# Patient Record
Sex: Male | Born: 1945 | Race: White | Hispanic: No | Marital: Married | Smoking: Former smoker
Health system: Southern US, Community
[De-identification: ages and names within clinical notes are randomized; demographics above are authoritative.]

## PROBLEM LIST (undated history)

## (undated) DIAGNOSIS — I2510 Atherosclerotic heart disease of native coronary artery without angina pectoris: Secondary | ICD-10-CM

## (undated) DIAGNOSIS — I10 Essential (primary) hypertension: Secondary | ICD-10-CM

## (undated) DIAGNOSIS — G43109 Migraine with aura, not intractable, without status migrainosus: Secondary | ICD-10-CM

## (undated) DIAGNOSIS — M1990 Unspecified osteoarthritis, unspecified site: Secondary | ICD-10-CM

## (undated) DIAGNOSIS — E785 Hyperlipidemia, unspecified: Secondary | ICD-10-CM

## (undated) DIAGNOSIS — I480 Paroxysmal atrial fibrillation: Secondary | ICD-10-CM

## (undated) DIAGNOSIS — T8859XA Other complications of anesthesia, initial encounter: Secondary | ICD-10-CM

## (undated) DIAGNOSIS — C61 Malignant neoplasm of prostate: Secondary | ICD-10-CM

## (undated) DIAGNOSIS — I499 Cardiac arrhythmia, unspecified: Secondary | ICD-10-CM

## (undated) DIAGNOSIS — Z87442 Personal history of urinary calculi: Secondary | ICD-10-CM

## (undated) DIAGNOSIS — R972 Elevated prostate specific antigen [PSA]: Secondary | ICD-10-CM

## (undated) DIAGNOSIS — C801 Malignant (primary) neoplasm, unspecified: Secondary | ICD-10-CM

## (undated) DIAGNOSIS — T4145XA Adverse effect of unspecified anesthetic, initial encounter: Secondary | ICD-10-CM

## (undated) DIAGNOSIS — K219 Gastro-esophageal reflux disease without esophagitis: Secondary | ICD-10-CM

## (undated) DIAGNOSIS — Z0389 Encounter for observation for other suspected diseases and conditions ruled out: Secondary | ICD-10-CM

## (undated) DIAGNOSIS — T7840XA Allergy, unspecified, initial encounter: Secondary | ICD-10-CM

## (undated) HISTORY — PX: COLONOSCOPY: SHX174

## (undated) HISTORY — DX: Allergy, unspecified, initial encounter: T78.40XA

## (undated) HISTORY — DX: Unspecified osteoarthritis, unspecified site: M19.90

## (undated) HISTORY — PX: KNEE ARTHROSCOPY: SUR90

## (undated) HISTORY — DX: Encounter for observation for other suspected diseases and conditions ruled out: Z03.89

## (undated) HISTORY — DX: Paroxysmal atrial fibrillation: I48.0

## (undated) HISTORY — PX: UPPER GASTROINTESTINAL ENDOSCOPY: SHX188

## (undated) HISTORY — PX: PROSTATE BIOPSY: SHX241

## (undated) HISTORY — DX: Hyperlipidemia, unspecified: E78.5

## (undated) HISTORY — DX: Essential (primary) hypertension: I10

---
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
## 1997-12-13 ENCOUNTER — Ambulatory Visit (HOSPITAL_COMMUNITY): Admission: RE | Admit: 1997-12-13 | Discharge: 1997-12-13 | Payer: Self-pay | Admitting: Orthopedic Surgery

## 1998-10-25 ENCOUNTER — Ambulatory Visit (HOSPITAL_COMMUNITY): Admission: RE | Admit: 1998-10-25 | Discharge: 1998-10-25 | Payer: Self-pay | Admitting: Internal Medicine

## 2001-06-21 ENCOUNTER — Encounter: Payer: Self-pay | Admitting: Gastroenterology

## 2004-08-23 ENCOUNTER — Ambulatory Visit (HOSPITAL_COMMUNITY): Admission: RE | Admit: 2004-08-23 | Discharge: 2004-08-23 | Payer: Self-pay | Admitting: Internal Medicine

## 2006-04-07 DIAGNOSIS — IMO0001 Reserved for inherently not codable concepts without codable children: Secondary | ICD-10-CM

## 2006-04-07 HISTORY — DX: Reserved for inherently not codable concepts without codable children: IMO0001

## 2006-07-07 DIAGNOSIS — Z87442 Personal history of urinary calculi: Secondary | ICD-10-CM

## 2006-07-07 HISTORY — DX: Personal history of urinary calculi: Z87.442

## 2006-07-16 ENCOUNTER — Emergency Department (HOSPITAL_COMMUNITY): Admission: EM | Admit: 2006-07-16 | Discharge: 2006-07-16 | Payer: Self-pay | Admitting: Emergency Medicine

## 2006-07-17 ENCOUNTER — Ambulatory Visit (HOSPITAL_BASED_OUTPATIENT_CLINIC_OR_DEPARTMENT_OTHER): Admission: RE | Admit: 2006-07-17 | Discharge: 2006-07-17 | Payer: Self-pay | Admitting: Urology

## 2006-07-19 ENCOUNTER — Inpatient Hospital Stay (HOSPITAL_COMMUNITY): Admission: EM | Admit: 2006-07-19 | Discharge: 2006-07-20 | Payer: Self-pay | Admitting: Emergency Medicine

## 2006-07-19 ENCOUNTER — Encounter (INDEPENDENT_AMBULATORY_CARE_PROVIDER_SITE_OTHER): Payer: Self-pay | Admitting: Cardiology

## 2006-07-20 HISTORY — PX: CARDIAC CATHETERIZATION: SHX172

## 2006-07-23 ENCOUNTER — Ambulatory Visit (HOSPITAL_COMMUNITY): Admission: RE | Admit: 2006-07-23 | Discharge: 2006-07-23 | Payer: Self-pay | Admitting: Urology

## 2010-03-22 ENCOUNTER — Encounter: Payer: Self-pay | Admitting: Gastroenterology

## 2010-03-22 ENCOUNTER — Encounter (INDEPENDENT_AMBULATORY_CARE_PROVIDER_SITE_OTHER): Payer: Self-pay | Admitting: *Deleted

## 2010-03-22 ENCOUNTER — Ambulatory Visit (HOSPITAL_COMMUNITY)
Admission: RE | Admit: 2010-03-22 | Discharge: 2010-03-22 | Payer: Self-pay | Source: Home / Self Care | Attending: Gastroenterology | Admitting: Gastroenterology

## 2010-04-11 ENCOUNTER — Ambulatory Visit
Admission: RE | Admit: 2010-04-11 | Discharge: 2010-04-11 | Payer: Self-pay | Source: Home / Self Care | Attending: Gastroenterology | Admitting: Gastroenterology

## 2010-04-11 ENCOUNTER — Encounter: Payer: Self-pay | Admitting: Gastroenterology

## 2010-04-11 DIAGNOSIS — I10 Essential (primary) hypertension: Secondary | ICD-10-CM | POA: Insufficient documentation

## 2010-04-11 DIAGNOSIS — E785 Hyperlipidemia, unspecified: Secondary | ICD-10-CM | POA: Insufficient documentation

## 2010-04-11 DIAGNOSIS — I480 Paroxysmal atrial fibrillation: Secondary | ICD-10-CM | POA: Insufficient documentation

## 2010-04-11 DIAGNOSIS — Z87442 Personal history of urinary calculi: Secondary | ICD-10-CM | POA: Insufficient documentation

## 2010-04-11 DIAGNOSIS — Z872 Personal history of diseases of the skin and subcutaneous tissue: Secondary | ICD-10-CM | POA: Insufficient documentation

## 2010-04-11 DIAGNOSIS — K649 Unspecified hemorrhoids: Secondary | ICD-10-CM | POA: Insufficient documentation

## 2010-04-11 DIAGNOSIS — Z860100 Personal history of colon polyps, unspecified: Secondary | ICD-10-CM | POA: Insufficient documentation

## 2010-04-11 DIAGNOSIS — K5730 Diverticulosis of large intestine without perforation or abscess without bleeding: Secondary | ICD-10-CM | POA: Insufficient documentation

## 2010-04-11 DIAGNOSIS — K222 Esophageal obstruction: Secondary | ICD-10-CM | POA: Insufficient documentation

## 2010-04-30 ENCOUNTER — Encounter: Payer: Self-pay | Admitting: Gastroenterology

## 2010-04-30 ENCOUNTER — Ambulatory Visit (HOSPITAL_COMMUNITY)
Admission: RE | Admit: 2010-04-30 | Discharge: 2010-04-30 | Payer: Self-pay | Source: Home / Self Care | Attending: Gastroenterology | Admitting: Gastroenterology

## 2010-05-06 ENCOUNTER — Encounter: Payer: Self-pay | Admitting: Gastroenterology

## 2010-05-09 NOTE — Assessment & Plan Note (Signed)
Summary: f/u food empaction EGD/pl   History of Present Illness Visit Type: new patient  Primary GI MD: Melvia Heaps MD [facility] Primary Provider: Nila Nephew, MD  Requesting Provider: na Chief Complaint: F/u from EGD. Pt states that he is ok and denies any GI complaints  History of Present Illness:   Luis [name] is a pleasant 65 year old white male referred at the request of Dr. Chilton Si for followup of his esophageal stricture.  Approximately 3 weeks ago he underwent emergency endoscopy for a food impaction.  A distal esophageal stricture was seen and dilated to 15 mm.  He has had occasional dysphagia to solids.  He denies pyrosis.   GI Review of Systems      Denies abdominal pain, acid reflux, belching, bloating, chest pain, dysphagia with liquids, dysphagia with solids, heartburn, loss of appetite, nausea, vomiting, vomiting blood, weight loss, and  weight gain.        Denies anal fissure, black tarry stools, change in bowel habit, constipation, diarrhea, diverticulosis, fecal incontinence, heme positive stool, hemorrhoids, irritable bowel syndrome, jaundice, light color stool, liver problems, rectal bleeding, and  rectal pain.    Current Medications (verified): 1)  Crestor 10 Mg Tabs (Rosuvastatin Calcium) .... One Tablet By Mouth Once Daily 2)  Fish Oil 1000 Mg Caps (Omega-3 Fatty Acids) .... 2 Capsule By Mouth Once Daily 3)  Aspirin 325 Mg Tabs (Aspirin) .... One Tablet By Mouth Once Daily 4)  Metoprolol Tartrate 100 Mg Tabs (Metoprolol Tartrate) .... One Tablet By Mouth Once Daily 5)  Diltiazem Hcl Cr 180 Mg Xr24h-Cap (Diltiazem Hcl) .... One Capsule By Mouth Once Daily  Allergies (verified): No Known Drug Allergies  Past History:  Past Medical History: HYPERTENSION (ICD-401.9) HYPERLIPIDEMIA (ICD-272.4) HEMORRHOIDS (ICD-455.6) ANAL FISSURE, HX OF (ICD-V13.3) ATRIAL FIBRILLATION (ICD-427.31) RENAL CALCULUS, HX OF (ICD-V13.01) DIVERTICULAR DISEASE (ICD-562.10) COLONIC POLYPS,  HYPERPLASTIC, HX OF (ICD-V12.72)  Past Surgical History: Unremarkable  Family History: No FH of Colon Cancer: Family History of Pancreatic Cancer: ? 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  Alcohol Use - yes: 2-3  Daily Caffeine Use: 1 daily  Illicit Drug Use - no Smoking Status:  quit Drug Use:  no  Review of Systems  The patient denies allergy/sinus, anemia, anxiety-new, arthritis/joint pain, back pain, blood in urine, breast changes/lumps, change in vision, confusion, cough, coughing up blood, depression-new, fainting, fatigue, fever, headaches-new, hearing problems, heart murmur, heart rhythm changes, itching, menstrual pain, muscle pains/cramps, night sweats, nosebleeds, pregnancy symptoms, shortness of breath, skin rash, sleeping problems, sore throat, swelling of feet/legs, swollen lymph glands, thirst - excessive , urination - excessive , urination changes/pain, urine leakage, vision changes, and voice change.    Vital Signs:  Patient profile:   65 year old male Height:      65 inches Weight:      174 pounds BMI:     29.06 BSA:     1.87 Pulse rate:   88 / minute Pulse rhythm:   regular BP sitting:   120 / 64  (left arm) Cuff size:   regular  Vitals Entered By: Ok Anis CMA (April 11, 2010 1:28 PM)  Physical Exam  Additional Exam:  On physical exam he is a well-developed well-nourished male  skin: anicteric HEENT: normocephalic; PEERLA; no nasal or pharyngeal abnormalities neck: supple nodes: no cervical lymphadenopathy chest: clear to ausculatation and percussion heart: no murmurs, gallops, or rubs abd: soft, nontender; BS  normoactive; no abdominal masses, tenderness, organomegaly rectal: deferred ext: no cynanosis, clubbing, edema skeletal: no deformities neuro: oriented x 3; no focal abnormalities    Impression & Recommendations:  Problem # 1:  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  Patient's questions were answered.  Orders: ZEGD Balloon Dil (ZEGD Balloon)  Problem # 2:  HYPERTENSION (ICD-401.9) Assessment: Comment Only  Problem # 3:  ATRIAL FIBRILLATION (ICD-427.31) Assessment: Comment Only  Patient Instructions: 1)  Copy sent to : Nila Nephew, MD  2)  Your EGD is scheduled on 04/30/2010 at 12:30pm 3)  Upper Endoscopy brochure given.  4)  Upper Endoscopy with Dilatation brochure given.  5)  The medication list was reviewed and reconciled.  All changed / newly prescribed medications were explained.  A complete medication list was provided to the patient / caregiver. Prescriptions: OMEPRAZOLE 20 MG CPDR (OMEPRAZOLE) 1 by mouth once daily  #30 x 3   Entered by:   Merri Ray CMA (AAMA)   Authorized by:   Louis Meckel MD   Signed by:   Merri Ray CMA (AAMA) on 04/11/2010   Method used:   Electronically to        [facility] [address] (retail)       [address]       [place], [place]  [address]       Ph: [phone]       Fax: [phone]   RxID:   [id number]

## 2010-05-09 NOTE — Letter (Signed)
Summary: EGD Instructions  [facility]  [address] [place], [place] [address]   Phone: [phone]  Fax: [phone]       Luis [name]    August 04, 1945    MRN: [id number]       Procedure Day /Date:Tuesday--04-30-2010     Arrival Time: 11:30 am     Procedure Time:12:30 pm     Location of Procedure:                     [facility] ( Outpatient Registration)   PREPARATION FOR ENDOSCOPY   On1-24-2012 THE DAY OF THE PROCEDURE:  1.   No solid foods, milk or milk products are allowed after midnight the night before your procedure.  2.   Do not drink anything colored red or purple.  Avoid juices with pulp.  No orange juice.  3.  You may drink clear liquids until 9:30am, which is 4 hours before your procedure.                                                                                                CLEAR LIQUIDS INCLUDE: Water Jello Ice Popsicles Tea (sugar ok, no milk/cream) Powdered fruit flavored drinks Coffee (sugar ok, no milk/cream) Gatorade Juice: apple, white grape, white cranberry  Lemonade Clear bullion, consomm, broth Carbonated beverages (any kind) Strained chicken noodle soup Hard Candy   MEDICATION INSTRUCTIONS  Unless otherwise instructed, you should take regular prescription medications with a small sip of water as early as possible the morning of your procedure.               OTHER INSTRUCTIONS  You will need a responsible adult at least 67 years of age to accompany you and drive you home.   This person must remain in the waiting room during your procedure.  Wear loose fitting clothing that is easily removed.  Leave jewelry and other valuables at home.  However, you may wish to bring a book to read or an iPod/MP3 player to listen to music as you wait for your procedure to start.  Remove all body piercing jewelry and leave at home.  Total time from sign-in until discharge is approximately 2-3 hours.  You  should go home directly after your procedure and rest.  You can resume normal activities the day after your procedure.  The day of your procedure you should not:   Drive   Make legal decisions   Operate machinery   Drink alcohol   Return to work  You will receive specific instructions about eating, activities and medications before you leave.    The above instructions have been reviewed and explained to me by   _______________________    I fully understand and can verbalize these instructions _____________________________ Date _________

## 2010-05-09 NOTE — Procedures (Signed)
Summary: Endo Prep  Endo Prep   Imported By: Lester Dale 05/01/2010 08:50:11  _____________________________________________________________________  External Attachment:    Type:   Image     Comment:   External Document

## 2010-05-09 NOTE — Procedures (Signed)
Summary: Upper Endoscopy  Patient: Luis [name] Note: All result statuses are Final unless otherwise noted.  Tests: (1) Upper Endoscopy (EGD)   EGD Upper Endoscopy       DONE     [facility]     [address] [place], [place]  [address]           ENDOSCOPY PROCEDURE REPORT           PATIENT:  Luis [name], Luis [name]  MR#:  [id number]     BIRTHDATE:  [birth year], 64 yrs. old  GENDER:  male           ENDOSCOPIST:  Barbette Hair. [name], MD     Referred by:  Nila Nephew, M.D.           PROCEDURE DATE:  04/30/2010     PROCEDURE:  EGD with balloon dilatation     ASA CLASS:  Class II     INDICATIONS:  dilation of esophageal stricture           MEDICATIONS:   Fentanyl 50 mcg IV, Versed 5 mg IV     TOPICAL ANESTHETIC:  Cetacaine Spray           DESCRIPTION OF PROCEDURE:   After the risks benefits and     alternatives of the procedure were thoroughly explained, informed     consent was obtained.  The Pentax Gastroscope [id number] endoscope     was introduced through the mouth and advanced to the third portion     of the duodenum, without limitations.  The instrument was slowly     withdrawn as the mucosa was fully examined.     <<PROCEDUREIMAGES>>           A stricture was found at the gastroesophageal junction. Moderate     stricture (see image2). balloon dilation 16-17-18mm TTS balloon     dilators inflated for 30 seconds each; moderate resistance; small     amount of blood  Otherwise the examination was normal (see image3,     image4, image6, and image7).    Retroflexed views revealed no     abnormalities.    The scope was then withdrawn from the patient     and the procedure completed.           COMPLICATIONS:  None           ENDOSCOPIC IMPRESSION:     1) Stricture at the gastroesophageal junction - s/p balloon     dilitation     2) Otherwise normal examination     RECOMMENDATIONS:     1) dilatations PRN     2) Call office next 2-3 days to schedule an office appointment     for 1 month           REPEAT EXAM:  No           ______________________________     Barbette Hair. [name], MD           CC:           n.     eSIGNED:   Barbette Hair. Cheridan Kibler at 04/30/2010 12:53 PM           Deloria Lair, [id number]  Note: An exclamation mark (!) indicates a result that was not dispersed into the flowsheet. Document Creation Date: 04/30/2010 12:54 PM _______________________________________________________________________  (1) Order result status: Final Collection or observation date-time: 04/30/2010 12:49 Requested date-time:  Receipt date-time:  Reported date-time:  Referring Physician:   Ordering Physician: Melvia Heaps [phone]) Specimen Source:  Source: Launa Grill Order Number: [id number] Lab site:

## 2010-05-09 NOTE — Letter (Signed)
Summary: Appt Reminder 2  [facility]  [address] [place], [place] [address]   Phone: [phone]  Fax: [phone]        March 22, 2010 MRN: [id number]    Jayant Kriz [address]  [place], [place]  [address]    Dear Mr. Claudette Laws,   You have a return appointment with Dr. [name] on 04/11/10 at 1:30 pm.  Please remember to bring a complete list of the medicines you are taking, your insurance card and your co-pay.  If you have to cancel or reschedule this appointment, please call before 5:00 pm the evening before to avoid a cancellation fee.  If you have any questions or concerns, please call [phone].    Sincerely,    Chales Abrahams CMA (AAMA)  Appended Document: Appt Reminder 2 letter mailed  Appended Document: Appt Reminder 2 new pt letter mailed

## 2010-05-09 NOTE — Procedures (Signed)
Summary: colonoscopy   Colonoscopy  Procedure date:  06/21/2001  Findings:      Results: Polyp.  Results: Diverticulosis.       Location:  [facility].    Comments:      Repeat colonoscopy in 3 years.    Patient Name: Luis [name], Luis [name] MRN:  Procedure Procedures: Colonoscopy CPT: [address].    with Hot Biopsy(s)CPT: Z451292.    with polypectomy. CPT: A3573898.  Personnel: Endoscopist: Barbette Hair. [name], MD.  Referred By: Erskine Speed, MD.  Indications Symptoms: Hematochezia.  Average Risk Screening Routine.  History  Pre-Exam Physical: Performed Jun 21, 2001. Entire physical exam was normal.  Exam Exam: Extent of exam reached: Cecum, extent intended: Cecum.  The cecum was identified by IC valve. Colon retroflexion performed. ASA Classification: II. Tolerance: good.  Monitoring: Pulse and BP monitoring, Oximetry used. Supplemental O2 given. at 2 Liters.  Colon Prep Used Golytely for colon prep. Prep results: good.  Sedation Meds: Fentanyl 100 mcg. given IV. Versed 10 mg. given IV.  Findings - DIVERTICULOSIS: Transverse Colon. ICD9: Diverticulosis: 562.10.  DIVERTICULOSIS: Transverse Colon. ICD9: Diverticulosis: 562.10.  - DIVERTICULOSIS: Descending Colon to Sigmoid Colon. ICD9: Diverticulosis: 562.10.  POLYP: Sigmoid Colon, Maximum size: 2 mm. Distance from Anus 20 cm. Procedure:  hot biopsy, Polyp sent to pathology. ICD9: Colon Polyps: 211.3.  POLYP: Sigmoid Colon, Maximum size: 3 mm. Distance from Anus 20 cm. Procedure:  hot biopsy, sent to pathology. ICD9: Colon Polyps: 211.3.  POLYP: Sigmoid Colon, Maximum size: 4 mm. Distance from Anus 18 cm. Procedure:  snare with cautery, ICD9: Colon Polyps: 211.3.   Assessment Abnormal examination, see findings above.  Diagnoses: 562.10: Diverticulosis.  211.3: Colon Polyps.   Events  Unplanned Interventions: No intervention was required.  Unplanned Events: There were no complications. Plans Patient  Education: Patient given standard instructions for: Polyps. Diverticulosis.  Scheduling/Referral: Colonoscopy, to Barbette Hair. [name], MD, around Jun 21, 2004.    This report was created from the original endoscopy report, which was reviewed and signed by the above listed endoscopist.

## 2010-05-09 NOTE — Letter (Signed)
Summary: New Patient letter  [facility]  [address] [place], [place] [address]   Phone: [phone]  Fax: [phone]       03/22/2010 MRN: [id number]  Luis [name] [address] [place], [place]  [address]  [place]  Dear Luis [name],  Welcome to the Gastroenterology Division at [facility].    You are scheduled to see Dr.  [name]  on 04/11/10  at 1:30 pm on the 3rd floor at [facility], [address]  We ask that you try to arrive at our office 15 minutes prior to your appointment time to allow for check-in.  We would like you to complete the enclosed self-administered evaluation form prior to your visit and bring it with you on the day of your appointment.  We will review it with you.  Also, please bring a complete list of all your medications or, if you prefer, bring the medication bottles and we will list them.  Please bring your insurance card so that we may make a copy of it.  If your insurance requires a referral to see a specialist, please bring your referral form from your primary care physician.  Co-payments are due at the time of your visit and may be paid by cash, check or credit card.     Your office visit will consist of a consult with your physician (includes a physical exam), any laboratory testing he/she may order, scheduling of any necessary diagnostic testing (e.g. x-ray, ultrasound, CT-scan), and scheduling of a procedure (e.g. Endoscopy, Colonoscopy) if required.  Please allow enough time on your schedule to allow for any/all of these possibilities.    If you cannot keep your appointment, please call [phone] to cancel or reschedule prior to your appointment date.  This allows [place] the opportunity to schedule an appointment for another patient in need of care.  If you do not cancel or reschedule by 5 p.m. the business day prior to your appointment date, you will be charged a $50.00 late cancellation/no-show fee.    Thank you for  choosing [facility] for your medical needs.  We appreciate the opportunity to care for you.  Please visit [place] at our website  to learn more about our practice.                     Sincerely,                                                             The Gastroenterology Division

## 2010-05-09 NOTE — Procedures (Signed)
Summary: Upper Endoscopy  Patient: Luis [name] Note: All result statuses are Final unless otherwise noted.  Tests: (1) Upper Endoscopy (EGD)   EGD Upper Endoscopy       DONE     [facility]     [address] [place], [place]  [address]           ENDOSCOPY PROCEDURE REPORT           PATIENT:  Luis [name], Luis [name]  MR#:  [id number]     BIRTHDATE:  Aug 29, 1945, 64 yrs. old  GENDER:  male     ENDOSCOPIST:  Rachael Fee, MD     Referred by:  Nila Nephew, M.D.     PROCEDURE DATE:  03/22/2010     PROCEDURE:  EGD with foreign body removal, EGD with balloon     dilatation     ASA CLASS:  Class II     INDICATIONS:  food impaction, pork     MEDICATIONS:  Fentanyl 80 mcg IV, Versed 8 mg IV     TOPICAL ANESTHETIC:  none     DESCRIPTION OF PROCEDURE:   After the risks benefits and     alternatives of the procedure were thoroughly explained, informed     consent was obtained.  The  endoscope was introduced through the     mouth and advanced to the second portion of the duodenum, without     limitations.  The instrument was slowly withdrawn as the mucosa     was fully examined.     <<PROCEDUREIMAGES>>     There was fluid and a solid meat food impaction in distal     esophagus. The fluid was suctioned out and then the food bolus was     gently pushed into the stomach. This revealed a peptic appearing,     focal stricture at GE junction (lumen through the stricture was     11mm). There was associated reflux and foreign body reaction     esophagitis. The stricture was dilated up to 15mm with CRE TTS     balloon (see image1, image11, image3, and image9).  Otherwise the     examination was normal (see image7, image6, and image5).     Retroflexed views revealed no abnormalities.    The scope was then     withdrawn from the patient and the procedure completed.           COMPLICATIONS:  None           ENDOSCOPIC IMPRESSION:     1) Food bolus in distal esophagus, pushed into  stomach.     Underlying, benign/peptic appearing focal stricture was dilated up     to 15mm.     2) Otherwise normal examination           RECOMMENDATIONS:     He will start twice daily OTC prilosec and continue until seen     by Dr. [name] in office (currently he does not take PPI).     My office will arrange return visit with Dr. [name] in 3-4 weeks     to assess his swallowing and consider further dilation.     He knows to eat slowly and chew his food very well for now.           ______________________________     Rachael Fee, MD           cc: Barnet Pall, MD  n.     eSIGNED:   Rachael Fee at 03/22/2010 02:30 PM           Deloria Lair, [id number]  Note: An exclamation mark (!) indicates a result that was not dispersed into the flowsheet. Document Creation Date: 03/22/2010 2:36 PM _______________________________________________________________________  (1) Order result status: Final Collection or observation date-time: 03/22/2010 14:20 Requested date-time:  Receipt date-time:  Reported date-time:  Referring Physician:   Ordering Physician: Rob Bunting [phone]) Specimen Source:  Source: Launa Grill Order Number: [id number] Lab site:

## 2010-05-15 NOTE — Medication Information (Signed)
Summary: Omeprazole/[facility]  Omeprazole/[facility]   Imported By: Sherian Rein 05/09/2010 09:36:14  _____________________________________________________________________  External Attachment:    Type:   Image     Comment:   External Document

## 2010-06-07 ENCOUNTER — Encounter: Payer: Self-pay | Admitting: Gastroenterology

## 2010-06-07 ENCOUNTER — Ambulatory Visit (INDEPENDENT_AMBULATORY_CARE_PROVIDER_SITE_OTHER): Payer: Medicare Other | Admitting: Gastroenterology

## 2010-06-07 DIAGNOSIS — K222 Esophageal obstruction: Secondary | ICD-10-CM

## 2010-06-18 NOTE — Assessment & Plan Note (Signed)
Summary: 45m Procedure f-up appt/sched w-pt/cx fee/YF   History of Present Illness Visit Type: Follow-up Visit Primary GI MD: Melvia Heaps MD [facility] Primary Provider: Nila Nephew, MD  Requesting Provider: na Chief Complaint: F/u from EGD. Pt states he is fine and denies any GI complaints today  History of Present Illness:    Luis [name] has returned for followup of his esophageal stricture. Following a food impaction and initial dilatation he underwent subsequent esophageal dilatation. Since that time he has felt well and  no longer has dysphagia. He was dilated to 18 mm.  He has very rare pyrosis.   GI Review of Systems      Denies abdominal pain, acid reflux, belching, bloating, chest pain, dysphagia with liquids, dysphagia with solids, heartburn, loss of appetite, nausea, vomiting, vomiting blood, weight loss, and  weight gain.        Denies anal fissure, black tarry stools, change in bowel habit, constipation, diarrhea, diverticulosis, fecal incontinence, heme positive stool, hemorrhoids, irritable bowel syndrome, jaundice, light color stool, liver problems, rectal bleeding, and  rectal pain.    Current Medications (verified): 1)  Crestor 10 Mg Tabs (Rosuvastatin Calcium) .... One Tablet By Mouth Once Daily 2)  Fish Oil 1000 Mg Caps (Omega-3 Fatty Acids) .... 2 Capsule By Mouth Once Daily 3)  Aspirin 325 Mg Tabs (Aspirin) .... One Tablet By Mouth Once Daily 4)  Metoprolol Tartrate 100 Mg Tabs (Metoprolol Tartrate) .... One Tablet By Mouth Once Daily 5)  Diltiazem Hcl Cr 180 Mg Xr24h-Cap (Diltiazem Hcl) .... One Capsule By Mouth Once Daily 6)  Omeprazole 20 Mg Cpdr (Omeprazole) .[name].. 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 Unremarkable  Family History: Reviewed history from 04/11/2010 and no changes required. No FH of Colon Cancer: Family History of Pancreatic Cancer: ? Mother  Family History of Heart Disease: Father   Social History: Reviewed history from 04/11/2010 and no changes required. 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  Alcohol Use - yes: 2-3  Daily Caffeine Use: 1 daily  Illicit Drug Use - no  Review of Systems  The patient denies allergy/sinus, anemia, anxiety-new, arthritis/joint pain, back pain, blood in urine, breast changes/lumps, change in vision, confusion, cough, coughing up blood, depression-new, fainting, fatigue, fever, headaches-new, hearing problems, heart murmur, heart rhythm changes, itching, menstrual pain, muscle pains/cramps, night sweats, nosebleeds, pregnancy symptoms, shortness of breath, skin rash, sleeping problems, sore throat, swelling of feet/legs, swollen lymph glands, thirst - excessive , urination - excessive , urination changes/pain, urine leakage, vision changes, and voice change.    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  All  changed / newly prescribed medications were explained.  A complete medication list was provided to the patient / caregiver.

## 2010-08-23 NOTE — Cardiovascular Report (Signed)
Luis [name], Luis [name]                  ACCOUNT NO.:  [id number]   MEDICAL RECORD NO.:  [id number]          PATIENT TYPE:  INP   LOCATION:  2918                         FACILITY:  [facility]   PHYSICIAN:  Cristy Hilts. [name], MD       DATE OF BIRTH:  [birth year]   DATE OF PROCEDURE:  07/20/2006  DATE OF DISCHARGE:                            CARDIAC CATHETERIZATION   PROCEDURE PERFORMED:  Left heart catheterization including  1. Left ventriculography  2. Selective left and right coronary angiography.  3. Abdominal aortogram.   INDICATIONS:  The patient is a 65 year old gentleman with a very strong  family history of premature artery disease who was admitted to the  hospital with chest discomfort and a new onset of atrial fibrillation  with rapid ventricular response.  He also had a short, brief episode of  wide complex tachycardia.  He was ruled out for myocardial infarction  and is now brought to the cardiac catheterization lab to evaluate his  coronary anatomy.  Abdominal aortogram was performed to evaluate for  abdominal atherosclerosis given significant calcification of his  coronary arteries.   HEMODYNAMIC DATA:  The ventricular pressures were 100/1 with an end  diastolic pressure of 5 mm/Hg.  The aortic pressure 110/60 with a mean  of 80 mm/Hg.  There was no pressure gradient across the aortic valve.   ANGIOGRAPHIC DATA:  LEFT VENTRICLE:  Left ventricular systolic function  was normal with ejection fraction of 60%.  There was no significant  mitral regurgitation.  There was no wall motion abnormality.   RIGHT CORONARY ARTERY:  The right coronary artery is small and  nondominant.  There was mild calcification noted in the ostium of the  right coronary artery.   LEFT MAIN:  The left main is a large caliber vessel.  There is mild  calcification noted.   LAD:  The LAD is a large vessel.  It has got a mild to moderate amount  of calcification in the proximal and mid segments.  Gives origin to  a  moderate-sized diagonal-1 and a moderate-sized Biochemist, clinical.  The diagonal-  2 has an ostial 40% to at most 50% smooth stenosis.   CIRCUMFLEX:  The circumflex is a dominant vessel.  Gives origin to large  OM-1 and OM-2.  It continues as the PDA.  This PDA in the distal segment  has a 40% stenosis.   ABDOMINAL ANGIOGRAM:  Abdominal angiogram revealed presence of two renal  arteries, one on either side.  They are widely patent.  There is no  evidence of abdominal aneurysm or significant calcification of the  abdominal aorta.  Incidentally, a left aortic stent was visualized.   IMPRESSION:  No significant coronary artery disease.  There is mild to  moderate mixed plaque morphology including mild calcification of the  proximal and mid LAD with a soft plaque in the distal circumflex and  diagonal-2 ostium.   RECOMMENDATIONS:  Aggressive risk modification is indicated.  The  patient can be discharged home on calcium channel blocker and beta  blocker therapy.  If he has recurrence of  atrial fibrillation.  He  should be considered for atrial fibrillation ablation.   A total of 90 ml of contrast was utilized for diagnostic angiography.   PROCEDURE:  Usual sterile precautions using a 6-French right femoral  arterial access, a 6-French multipurpose B2 catheter was advanced into  the ascending aorta over the J-wire and the catheter was advanced into  left ventricle.  The left ventriculography was performed both in LAO and  RAO projections.  Catheter pulled back into the ascending aorta and  pressure gradient across the aortic valve was checked.  Right coronary  artery selective angiography was performed.  Catheter then pulled back  to the abdominal aorta.  Abdominal aortogram was performed.  A 6.1  Judkins IV diagnostic catheter was utilized in the left main coronary  artery and angiography was performed.  Catheter was then pulled out of  the body in the usual fashion.  Right femoral  angiography was performed  through the arterial access sheath and access closed with Star Close  with excellent hemostasis.  No immediate complication noted.   RECOMMENDATIONS:  given mixed plaque morphology with calcification in  the proximal and mid LAD, he will need very aggressive lipid lowering  therapy with a goal of raising his HDL to greater than 45, HDL to less  than or equal to 70, triglycerides less than or equal to 150.      Cristy Hilts. [name], MD  Electronically Signed     JRG/MEDQ  D:  07/20/2006  T:  07/20/2006  Job:  [id number]   cc:   Erskine Speed, M.D.  Dr Everlene Balls

## 2010-08-23 NOTE — Op Note (Signed)
NAMEBRENTLEY, [name]                  ACCOUNT NO.:  [id number]   MEDICAL RECORD NO.:  [id number]          PATIENT TYPE:  AMB   LOCATION:  [facility]                         FACILITY:  [facility]   PHYSICIAN:  Boston Service, M.D.DATE OF BIRTH:  09-05-1945   DATE OF PROCEDURE:  07/17/2006  DATE OF DISCHARGE:                               OPERATIVE REPORT   PREOPERATIVE DIAGNOSIS:  A 9 mm left ureteropelvic junction stone.   POSTOPERATIVE DIAGNOSIS:  A 9 mm left ureteropelvic junction stone.   OPERATION/PROCEDURE:  Cystoscopy, retrograde, double-J stent, 6-French  26 cm.   SURGEON:  Boston Service, M.D.   ASSISTANT:  None.   ANESTHESIA:  General.   SPECIMENS:  None.   ESTIMATED BLOOD LOSS:  Minimal.   COMPLICATIONS:  None obvious.   DESCRIPTION OF PROCEDURE:  The patient was prepped and draped in the  dorsal lithotomy position after institution of an adequate level of  general anesthesia.  A  well lubricated 21-French panendoscope was  gently inserted at the urethral meatus.  Normal urethra and sphincter,  nonobstructive prostate.  Bladder was carefully inspected with 12 and 70  degrees lenses.  No obvious intravesical pathology.   Retrograde catheter was selected, positioned at the right ureteral  orifice, then at the left ureteral orifice, in each case with gentle  injection of contrast.  Ureter, pelvis and calyces were gently outlined.  There was no filling defect or obstruction on the right with prompt  drainage at 3-5 minutes.  On the left side, however, there was a tightly  impacted 9 mm filling defect at about the level of the UPJ.  With gentle  injection of contrast stone, appeared to migrate into the renal pelvis.  A  6-French 26 cm double-J stent was selected, advanced over the  indwelling guidewire with excellent pigtail formation on guidewire  removal.  The patient was observed after double-J stent placement.  There  was prompt reflux of concentrated urine through the  fenestrations of the  stent.  Bladder was drained.  Cystoscope was removed.  The patient was  returned to recovery in satisfactory condition.   PLAN:  I have asked the patient to discontinue aspirin.  Will proceed  with ESWL in about 5-7 days.           ______________________________  Boston Service, M.D.     RH/MEDQ  D:  07/17/2006  T:  07/17/2006  Job:  [id number]   cc:   Erskine Speed, M.D.  Fax: [phone]

## 2010-08-23 NOTE — Discharge Summary (Signed)
NAMEIVER, MIKLAS                  ACCOUNT NO.:  [id number]   MEDICAL RECORD NO.:  [id number]          PATIENT TYPE:  INP   LOCATION:  2918                         FACILITY:  [facility]   PHYSICIAN:  Cristy Hilts. [name], MD       DATE OF BIRTH:  February 02, 1946   DATE OF ADMISSION:  07/19/2006  DATE OF DISCHARGE:  07/20/2006                               DISCHARGE SUMMARY   Mr. Standish is a 65 year old white, married, male patient who came to the  emergency room with palpitations and tachycardia.  The symptoms had  begun about 7 p.m.  He stated that he has had 3 other episodes this past  year; however, none of them lasted this long, thus he came to the  emergency room for evaluation.  He was found to be atrial fib, rapid  ventricular response.  He was put on IV heparin.  IV Cardizem was  initiated and he was given some Lopressor.  It was decided that he  should be ruled out for an MI.  He has a significant cardiac family  history and hypertension and dyslipidemia, thus it was decided he should  undergo cardiac catheterization.  This was performed on July 20, 2006  by Dr. Yates Decamp.  He had a small RCA.  He is circumflex dominant.  He  had a 40% distal circumflex and a diagonal 2 artery and renal arteries  are normal.  He had 90 mL of contrast.  It was noted that he had a  ureteral stent.  His EF was 60%.  He had no MR.  It was decided he  should be discharged home on calcium channel block and beta-blocker.  He  will continue to hold his aspirin until he gets a planned lithotripsy  done by Dr. Wanda Plump scheduled on Thursday.  He apparently had a  ureteral stent placed on July 17, 2006 in preparation for his  lithotripsy.  Currently, I am awaiting a call back from Dr. Wanda Plump to  discuss his hospitalization.  The patient was considered to be stable  for discharge.  He did have a star closure by Dr. [name].  He was up  walking in the halls.  His right groin bandage was dry.   DISCHARGE MEDICATIONS:   Are:  1. Macrobid twice daily.  2. Aspirin 325 mg every day.  3. Metoprolol 50 mg twice a day.  4. Diltiazem CD 180 mg a day.  5. Flomax 0.4 mcg at bedtime every night.  6. D12 2 mg every day.  7. He should stop taking his Inderal.  8. Crestor 10 mg once a day.  9. Prilosec 20 mg a day for 2 weeks and then on a p.r.n. basis.   He will follow up with Dr. [name] in 2 weeks.  Our office will call to  give him an appointment.   DISCHARGE DIAGNOSES:  1. Paroxysmal atrial fibrillation with rapid ventricular response.  2. Dyslipidemia.  3. Nephrolithiasis with recent ureteral stent placed and to have      lithotripsy on Thursday.  4. Hypertension.  5. Significant  premature family history coronary artery disease.  6. Status post cath with mild coronary disease, nonobstructive, normal      ejection fraction, normal renal arteries and recommendation for      aggressive care.      Lezlie Octave, N.P.      Cristy Hilts. [name], MD  Electronically Signed    BB/MEDQ  D:  07/20/2006  T:  07/20/2006  Job:  [id number]   cc:   [place] Service, M.D.  Erskine Speed, M.D.

## 2011-12-01 ENCOUNTER — Other Ambulatory Visit: Payer: Self-pay | Admitting: Gastroenterology

## 2011-12-01 MED ORDER — OMEPRAZOLE 40 MG PO CPDR: DELAYED_RELEASE_CAPSULE | ORAL | Status: DC

## 2011-12-01 NOTE — Telephone Encounter (Signed)
Patient aware medication sent today, verified pharmacy

## 2012-04-27 ENCOUNTER — Encounter (HOSPITAL_COMMUNITY): Payer: Self-pay | Admitting: Emergency Medicine

## 2012-04-27 ENCOUNTER — Emergency Department (HOSPITAL_COMMUNITY): Payer: Medicare Other

## 2012-04-27 ENCOUNTER — Other Ambulatory Visit: Payer: Self-pay

## 2012-04-27 ENCOUNTER — Emergency Department (HOSPITAL_COMMUNITY)
Admission: EM | Admit: 2012-04-27 | Discharge: 2012-04-27 | Discharge status: Against Medical Advice | Payer: Medicare Other | Attending: Emergency Medicine | Admitting: Emergency Medicine

## 2012-04-27 DIAGNOSIS — R0789 Other chest pain: Secondary | ICD-10-CM | POA: Insufficient documentation

## 2012-04-27 HISTORY — DX: Gastro-esophageal reflux disease without esophagitis: K21.9

## 2012-04-27 LAB — CBC WITH DIFFERENTIAL/PLATELET
Basophils Relative: 0 % (ref 0–1)
Hemoglobin: 16.1 g/dL (ref 13.0–17.0)
Lymphocytes Relative: 12 % (ref 12–46)
MCHC: 36.6 g/dL — ABNORMAL HIGH (ref 30.0–36.0)
Monocytes Relative: 6 % (ref 3–12)
Neutro Abs: 7.5 10*3/uL (ref 1.7–7.7)
Neutrophils Relative %: 81 % — ABNORMAL HIGH (ref 43–77)
RBC: 4.82 MIL/uL (ref 4.22–5.81)
WBC: 9.2 10*3/uL (ref 4.0–10.5)

## 2012-04-27 LAB — COMPREHENSIVE METABOLIC PANEL
AST: 23 U/L (ref 0–37)
Albumin: 3.9 g/dL (ref 3.5–5.2)
Alkaline Phosphatase: 85 U/L (ref 39–117)
BUN: 19 mg/dL (ref 6–23)
CO2: 27 mEq/L (ref 19–32)
Chloride: 102 mEq/L (ref 96–112)
Potassium: 3.6 mEq/L (ref 3.5–5.1)
Total Bilirubin: 0.5 mg/dL (ref 0.3–1.2)

## 2012-04-27 LAB — POCT I-STAT TROPONIN I: Troponin i, poc: 0.01 ng/mL (ref 0.00–0.08)

## 2012-04-27 NOTE — ED Notes (Signed)
Pt st's he has to leave has to get home to his grandchildren.  Encouraged pt to stay advised him that he should not leave if he is having chest pain.    Pt st's he can't wait any longer

## 2012-04-27 NOTE — ED Notes (Signed)
Called pt in waiting room and triage, no answer

## 2012-04-27 NOTE — ED Notes (Signed)
Cp that started 2-3 hours ago  Rt sided hurts to breat deep no n/v ate lunch no issues

## 2012-06-02 ENCOUNTER — Emergency Department (HOSPITAL_COMMUNITY)
Admission: EM | Admit: 2012-06-02 | Discharge: 2012-06-02 | Disposition: A | Discharge status: Home / Self Care | Payer: Medicare Other | Attending: Emergency Medicine | Admitting: Emergency Medicine

## 2012-06-02 ENCOUNTER — Other Ambulatory Visit: Payer: Self-pay

## 2012-06-02 ENCOUNTER — Emergency Department (HOSPITAL_COMMUNITY): Payer: Medicare Other

## 2012-06-02 ENCOUNTER — Encounter (HOSPITAL_COMMUNITY): Payer: Self-pay

## 2012-06-02 DIAGNOSIS — Z8679 Personal history of other diseases of the circulatory system: Secondary | ICD-10-CM | POA: Insufficient documentation

## 2012-06-02 DIAGNOSIS — Z79899 Other long term (current) drug therapy: Secondary | ICD-10-CM | POA: Insufficient documentation

## 2012-06-02 DIAGNOSIS — R42 Dizziness and giddiness: Secondary | ICD-10-CM | POA: Insufficient documentation

## 2012-06-02 DIAGNOSIS — Z9861 Coronary angioplasty status: Secondary | ICD-10-CM | POA: Insufficient documentation

## 2012-06-02 DIAGNOSIS — R002 Palpitations: Secondary | ICD-10-CM | POA: Insufficient documentation

## 2012-06-02 DIAGNOSIS — K219 Gastro-esophageal reflux disease without esophagitis: Secondary | ICD-10-CM | POA: Insufficient documentation

## 2012-06-02 DIAGNOSIS — Z87442 Personal history of urinary calculi: Secondary | ICD-10-CM | POA: Insufficient documentation

## 2012-06-02 HISTORY — DX: Personal history of urinary calculi: Z87.442

## 2012-06-02 LAB — CBC
HCT: 45.6 % (ref 39.0–52.0)
Hemoglobin: 16.7 g/dL (ref 13.0–17.0)
MCH: 33.3 pg (ref 26.0–34.0)
MCHC: 36.6 g/dL — ABNORMAL HIGH (ref 30.0–36.0)

## 2012-06-02 LAB — BASIC METABOLIC PANEL
BUN: 18 mg/dL (ref 6–23)
Calcium: 9.6 mg/dL (ref 8.4–10.5)
Creatinine, Ser: 1.11 mg/dL (ref 0.50–1.35)
GFR calc non Af Amer: 66 mL/min — ABNORMAL LOW (ref >90)
Glucose, Bld: 128 mg/dL — ABNORMAL HIGH (ref 70–99)

## 2012-06-02 LAB — POCT I-STAT TROPONIN I: Troponin i, poc: 0 ng/mL (ref 0.00–0.08)

## 2012-06-02 NOTE — ED Notes (Signed)
Patient presents with c/o palpitations that began about 1030 this evening while resting and reading a book. Had some slight lightheadedness. No CP, SOB, N/V, abd pain, diaphoresis or headache. Last episode 2008 where he was admitted for A-fib with RVR & palpitations. Cardiac cath done by Dr. [name] on 4/14 which revealed no significant CAD. Currently, patient is without any palpitations, complaints or discomfort. States "I feel like a million bucks and ready to go home"

## 2012-06-02 NOTE — ED Notes (Signed)
Denies any palpation, CP or N/V.

## 2012-06-02 NOTE — ED Provider Notes (Addendum)
History     CSN: [id number]  Arrival date & time 06/02/12  0048   First MD Initiated Contact with Patient 06/02/12 [phone]      Chief Complaint  Patient presents with  . Palpitations     The history is provided by the patient.   patient reports a feeling of palpitations that began this evening around 10:30 PM.  He has a history of atrial fibrillation before in the past.  He states his heart felt irregular.  No chest pain or shortness of breath.  By the time he arrived to the emergency department his symptoms had resolved.  He had transient lightheadedness when the symptoms started.  He has had a heart catheterization before in the past demonstrating nonobstructive coronary artery disease.  Denies increased caffeine intake and increased alcohol use.  No other new medications.  He's been compliant with his medications.  His symptoms are mild to moderate in severity when they occur.  No symptoms at this time.  Past Medical History  Diagnosis Date  . GERD (gastroesophageal reflux disease)   . History of kidney stones 07/2006    x 1    Past Surgical History  Procedure Laterality Date  . Cardiac catheterization  07/20/2006    No significant CAD, EF 60%    No family history on file.  History  Substance Use Topics  . Smoking status: Never Smoker   . Smokeless tobacco: Never Used  . Alcohol Use: Yes      Review of Systems  Cardiovascular: Positive for palpitations.  All other systems reviewed and are negative.    Allergies  Review of patient's allergies indicates no known allergies.  Home Medications   Current Outpatient Rx  Name  Route  Sig  Dispense  Refill  . omeprazole (PRILOSEC) 40 MG capsule      Use 1-2 by mouth daily 30 minutes before a meal   90 capsule   3     BP 94/78  Temp(Src) 98.1 F (36.7 C) (Oral)  Resp 12  SpO2 98%  Physical Exam  Nursing note and vitals reviewed. Constitutional: He is oriented to person, place, and time. He appears  well-developed and well-nourished.  HENT:  Head: Normocephalic and atraumatic.  Eyes: EOM are normal.  Neck: Normal range of motion.  Cardiovascular: Normal rate, regular rhythm, normal heart sounds and intact distal pulses.   Pulmonary/Chest: Effort normal and breath sounds normal. No respiratory distress.  Abdominal: Soft. He exhibits no distension. There is no tenderness.  Musculoskeletal: Normal range of motion.  Neurological: He is alert and oriented to person, place, and time.  Skin: Skin is warm and dry.  Psychiatric: He has a normal mood and affect. Judgment normal.    ED Course  Procedures (including critical care time)  Labs Reviewed  CBC - Abnormal; Notable for the following:    MCHC 36.6 (*)    Platelets 145 (*)    All other components within normal limits  BASIC METABOLIC PANEL - Abnormal; Notable for the following:    Glucose, Bld 128 (*)    GFR calc non Af Amer 66 (*)    GFR calc Af Amer 77 (*)    All other components within normal limits  POCT I-STAT TROPONIN I   Dg Chest 2 View  06/02/2012  *RADIOLOGY REPORT*  Clinical Data: Palpitations beginning about 10:30 this evening. Lightheadedness.  CHEST - 2 VIEW  Comparison: 04/27/2012  Findings: The heart size and pulmonary vascularity are normal. The lungs  appear clear and expanded without focal air space disease or consolidation. No blunting of the costophrenic angles.  No pneumothorax.  Mediastinal contours appear intact.  Calcification of the aorta.  Degenerative changes in the spine.  No significant change since previous study.  IMPRESSION: No evidence of active pulmonary disease.   Original Report Authenticated By: Burman Nieves, M.D.    I personally reviewed the imaging tests through PACS system I reviewed available ER/hospitalization records through the EMR   1. Palpitations       MDM  Likely transient A. fib.  Converted on his own.  Labs electrolytes normal.  EKG normal.  Doubt ACS.  Cardiology  followup.  Reminded the patient to minimize stimulants and alcohol.    Lyanne Co, MD 06/02/12 0214   Date: 06/02/2012  Rate: 83  Rhythm: normal sinus rhythm  QRS Axis: normal  Intervals: normal  ST/T Wave abnormalities: normal  Conduction Disutrbances: none  Narrative Interpretation:   Old EKG Reviewed: No significant changes noted     Lyanne Co, MD 06/02/12 [phone]

## 2012-06-19 ENCOUNTER — Encounter (HOSPITAL_COMMUNITY): Payer: Self-pay | Admitting: Nurse Practitioner

## 2012-06-19 ENCOUNTER — Emergency Department (HOSPITAL_COMMUNITY)
Admission: EM | Admit: 2012-06-19 | Discharge: 2012-06-19 | Disposition: A | Discharge status: Home / Self Care | Payer: Medicare Other | Attending: Emergency Medicine | Admitting: Emergency Medicine

## 2012-06-19 DIAGNOSIS — E785 Hyperlipidemia, unspecified: Secondary | ICD-10-CM | POA: Diagnosis present

## 2012-06-19 DIAGNOSIS — I4891 Unspecified atrial fibrillation: Secondary | ICD-10-CM

## 2012-06-19 DIAGNOSIS — Z8719 Personal history of other diseases of the digestive system: Secondary | ICD-10-CM | POA: Insufficient documentation

## 2012-06-19 DIAGNOSIS — I480 Paroxysmal atrial fibrillation: Secondary | ICD-10-CM | POA: Diagnosis present

## 2012-06-19 DIAGNOSIS — Z79899 Other long term (current) drug therapy: Secondary | ICD-10-CM | POA: Insufficient documentation

## 2012-06-19 DIAGNOSIS — Z7982 Long term (current) use of aspirin: Secondary | ICD-10-CM | POA: Insufficient documentation

## 2012-06-19 DIAGNOSIS — Z87442 Personal history of urinary calculi: Secondary | ICD-10-CM | POA: Insufficient documentation

## 2012-06-19 LAB — BASIC METABOLIC PANEL
CO2: 25 mEq/L (ref 19–32)
Calcium: 9.8 mg/dL (ref 8.4–10.5)
Chloride: 102 mEq/L (ref 96–112)
Creatinine, Ser: 1.25 mg/dL (ref 0.50–1.35)
GFR calc Af Amer: 67 mL/min — ABNORMAL LOW (ref >90)
Sodium: 138 mEq/L (ref 135–145)

## 2012-06-19 LAB — CBC
MCH: 32.5 pg (ref 26.0–34.0)
Platelets: 158 10*3/uL (ref 150–400)
RBC: 5.04 MIL/uL (ref 4.22–5.81)
RDW: 12.7 % (ref 11.5–15.5)
WBC: 6.8 10*3/uL (ref 4.0–10.5)

## 2012-06-19 LAB — POCT I-STAT TROPONIN I: Troponin i, poc: 0 ng/mL (ref 0.00–0.08)

## 2012-06-19 MED ORDER — DILTIAZEM HCL 100 MG IV SOLR
5.0000 mg/h | Freq: Once | INTRAVENOUS | Status: AC
  Administered 2012-06-19: 5 mg/h via INTRAVENOUS

## 2012-06-19 MED ORDER — FLECAINIDE ACETATE 50 MG PO TABS: 50.0000 mg | ORAL_TABLET | Freq: Two times a day (BID) | ORAL | Status: DC

## 2012-06-19 MED ORDER — DILTIAZEM HCL 25 MG/5ML IV SOLN
5.0000 mg | Freq: Once | INTRAVENOUS | Status: AC
  Administered 2012-06-19: 5 mg via INTRAVENOUS
  Filled 2012-06-19: qty 5

## 2012-06-19 MED ORDER — FLECAINIDE ACETATE 50 MG PO TABS
50.0000 mg | ORAL_TABLET | Freq: Two times a day (BID) | ORAL | Status: DC
  Filled 2012-06-19: qty 1

## 2012-06-19 MED ORDER — FLECAINIDE ACETATE 50 MG PO TABS
50.0000 mg | ORAL_TABLET | Freq: Once | ORAL | Status: AC
  Administered 2012-06-19: 50 mg via ORAL
  Filled 2012-06-19: qty 1

## 2012-06-19 NOTE — ED Notes (Signed)
Pt was playing golf and bent over and felt that he may pass out. When he stood back up he began to feel more lightheaded and "heart began to flutter." history of A fib but is not on blood thinners because he has "never been in it for long." A&Ox4, resp e/u

## 2012-06-19 NOTE — ED Provider Notes (Signed)
History     CSN: [id number]  Arrival date & time 06/19/12  1359   First MD Initiated Contact with Patient 06/19/12 1415      Chief Complaint  Patient presents with  . Atrial Fibrillation    (Consider location/radiation/quality/duration/timing/severity/associated sxs/prior treatment) HPI  Work she was playing golf today and when he bent over to pick up a golf ball he had acute onset of feeling like he was going to pass out. He got to the golf cart and still felt like he was going to pass out. He denies chest pain, shortness of breath or headache. He states he can feel his heart is beating fast and irregular. He relates he had similar symptoms 2 weeks ago however he converted to normal sinus rhythm in the ED without treatment. He reports it originally started in 2000 a he was admitted to the hospital that time. He states he had a cardiac cath done that showed no significant heart disease. He relates he was started on the metoprolol and diltiazem at that time. He also was placed on 81 mg baby aspirin for anticoagulation.  PCP Dr Mila Palmer Cardiology Dr Allyson Sabal  Past Medical History  Diagnosis Date  . GERD (gastroesophageal reflux disease)   . History of kidney stones 07/2006    x 1  . Atrial fibrillation     Past Surgical History  Procedure Laterality Date  . Cardiac catheterization  07/20/2006    No significant CAD, EF 60%    History reviewed. No pertinent family history.  History  Substance Use Topics  . Smoking status: Never Smoker   . Smokeless tobacco: Never Used  . Alcohol Use: Yes   Drinks 6 ounces of scotch or vodka a day Lives at home Lives with spouse   Review of Systems  All other systems reviewed and are negative.    Allergies  Review of patient's allergies indicates no known allergies.  Home Medications   Current Outpatient Rx  Name  Route  Sig  Dispense  Refill  . aspirin 325 MG buffered tablet   Oral   Take 81.25 mg by mouth daily. Takes 1/4 of a  tablet         . diltiazem (DILACOR XR) 180 MG 24 hr capsule   Oral   Take 180 mg by mouth daily.         . metoprolol succinate (TOPROL-XL) 100 MG 24 hr tablet   Oral   Take 100 mg by mouth daily. Take with or immediately following a meal.         . Omega-3 Fatty Acids (FISH OIL PO)   Oral   Take 1 capsule by mouth at bedtime.         . rosuvastatin (CRESTOR) 10 MG tablet   Oral   Take 10 mg by mouth daily.           BP 114/39  Pulse 84  Temp(Src) 97.9 F (36.6 C) (Oral)  Resp   SpO2 98%  Vital signs normal    Physical Exam  Nursing note and vitals reviewed. Constitutional: He is oriented to person, place, and time. He appears well-developed and well-nourished.  Non-toxic appearance. He does not appear ill. No distress.  HENT:  Head: Normocephalic and atraumatic.  Right Ear: External ear normal.  Left Ear: External ear normal.  Nose: Nose normal. No mucosal edema or rhinorrhea.  Mouth/Throat: Oropharynx is clear and moist and mucous membranes are normal. No dental abscesses or edematous.  Eyes: Conjunctivae and EOM are normal. Pupils are equal, round, and reactive to light.  Neck: Normal range of motion and full passive range of motion without pain. Neck supple.  Cardiovascular: Normal heart sounds.  An irregular rhythm present. Tachycardia present.  Exam reveals no gallop and no friction rub.   No murmur heard. Pulmonary/Chest: Effort normal and breath sounds normal. No respiratory distress. He has no wheezes. He has no rhonchi. He has no rales. He exhibits no tenderness and no crepitus.  Abdominal: Soft. Normal appearance and bowel sounds are normal. He exhibits no distension. There is no tenderness. There is no rebound and no guarding.  Musculoskeletal: Normal range of motion. He exhibits no edema and no tenderness.  Moves all extremities well.   Neurological: He is alert and oriented to person, place, and time. He has normal strength. No cranial nerve  deficit.  Skin: Skin is warm, dry and intact. No rash noted. No erythema. No pallor.  Psychiatric: He has a normal mood and affect. His speech is normal and behavior is normal. His mood appears not anxious.    ED Course  Procedures (including critical care time)  Medications  flecainide (TAMBOCOR) tablet 50 mg (not administered)  diltiazem (CARDIZEM) 100 mg in dextrose 5 % 100 mL infusion (5 mg/hr Intravenous New Bag/Given 06/19/12 1439)  diltiazem (CARDIZEM) injection 5 mg (0 mg Intravenous Stopped 06/19/12 1515)   Recheck 15:35 still in afib with rate around 100. Cardiology PA at bedside  17:10 Pt has converted to NSR, has been seen by Dr Royann Shivers. States he can be discharged, stop his diltiazem, continue metoprolol and start on Flecainamide. Will be seen in the office this week to discuss flecainamide   Results for orders placed during the hospital encounter of 06/19/12  CBC      Result Value Range   WBC 6.8  4.0 - 10.5 K/uL   RBC 5.04  4.22 - 5.81 MIL/uL   Hemoglobin 16.4  13.0 - 17.0 g/dL   HCT 96.0  45.4 - 09.8 %   MCV 88.5  78.0 - 100.0 fL   MCH 32.5  26.0 - 34.0 pg   MCHC 36.8 (*) 30.0 - 36.0 g/dL   RDW 11.9  14.7 - 82.9 %   Platelets 158  150 - 400 K/uL  BASIC METABOLIC PANEL      Result Value Range   Sodium 138  135 - 145 mEq/L   Potassium 3.9  3.5 - 5.1 mEq/L   Chloride 102  96 - 112 mEq/L   CO2 25  19 - 32 mEq/L   Glucose, Bld 192 (*) 70 - 99 mg/dL   BUN 26 (*) 6 - 23 mg/dL   Creatinine, Ser 5.62  0.50 - 1.35 mg/dL   Calcium 9.8  8.4 - 13.0 mg/dL   GFR calc non Af Amer 57 (*) >90 mL/min   GFR calc Af Amer 67 (*) >90 mL/min  POCT I-STAT TROPONIN I      Result Value Range   Troponin i, poc 0.00  0.00 - 0.08 ng/mL   Comment 3            Laboratory interpretation all normal except hyperglycemia   Dg Chest 2 View  06/02/2012  *RADIOLOGY REPORT*  Clinical Data: Palpitations beginning about 10:30 this evening. Lightheadedness.  CHEST - 2 VIEW  Comparison:  04/27/2012  Findings: The heart size and pulmonary vascularity are normal. The lungs appear clear and expanded without focal air space disease or consolidation.  No blunting of the costophrenic angles.  No pneumothorax.  Mediastinal contours appear intact.  Calcification of the aorta.  Degenerative changes in the spine.  No significant change since previous study.  IMPRESSION: No evidence of active pulmonary disease.   Original Report Authenticated By: Burman Nieves, M.D.         Date: 06/19/2012  Rate: 144  Rhythm: atrial fibrillation  QRS Axis: normal  Intervals: normal  ST/T Wave abnormalities: nonspecific ST changes  Conduction Disutrbances:none  Narrative Interpretation:   Old EKG Reviewed: none available     1. Atrial fibrillation    Discharge medications flecainamide 50 mg BID Stop diltiazem  Plan discharge  Devoria Albe, MD, FACEP   CRITICAL CARE Performed by: Devoria Albe L   Total critical care time: 31 min   Critical care time was exclusive of separately billable procedures and treating other patients.  Critical care was necessary to treat or prevent imminent or life-threatening deterioration.  Critical care was time spent personally by me on the following activities: development of treatment plan with patient and/or surrogate as well as nursing, discussions with consultants, evaluation of patient's response to treatment, examination of patient, obtaining history from patient or surrogate, ordering and performing treatments and interventions, ordering and review of laboratory studies, ordering and review of radiographic studies, pulse oximetry and re-evaluation of patient's condition.   MDM          Ward Givens, MD 06/19/12 [phone]

## 2012-06-19 NOTE — Consult Note (Signed)
Reason for Consult: Atrial Fibrillation  Referring Physician: [facility] ER Physician  Primary Cardiologist: Dr. Allyson Sabal  HPI: The patient 67 year old, thin and fit appearing, Caucasian male. He is a patient of Dr. Hazle Coca.  He has a history of paroxysmal atrial fibrillation, on metoprolol and Cardizem. He is not on an anticoagulant. He takes 81 mg of ASA daily.  He was catheterized by Dr. Yates Decamp on July 20, 2006, revealing noncritical CAD with normal LV function. He does have a 35 pack year history of tobacco abuse, having quit in August 1996. His other problems include mild hyperlipidemia, a strong family history of heart disease with a father, who had an MI at age 60 and a brother, who had stents placed at the age of 79. The patient sees Dr. Allyson Sabal on a yearly basis. His last office visit was in December 2013. At that time, he was doing well and was in normal sinus rhythm with a heart rate of 65 bpm. The patient presented to the [facility] ER about 2 weeks ago with presyncopal like symptoms, and was found to be in A-fib. He converted on his own and was released the same day by the ER physician.   He presented back to the [facility] ER today for presyncope. Up until this morning, he reports that he was in his usual state of health. He was playing golf with his son earlier today when his symptoms first started. He was on his third hole and had been playing for ~45 minutes. He was reaching down to place the ball on the tee, and developed  lightheadedness and dizziness when he came up.  He felt like he was going to pass-out.  He checked his pulse and felt that it was irregular. The sensation did not go away. He had his son drive him to the ED. The episode lasted ~35 minutes. He denies associated SOB, CP, syncope, n/v.  He reports compliance with his home medications, including metoprolol, Cardizem and ASA. In the ED, his EKG showed rapid A-fib with a ventricular rate of 144. He was started on IV Diltiazem. He remains in  A-fib but his rate is better controlled in the 80's-90's. He is currently without symptoms.    Past Medical History  Diagnosis Date  . GERD (gastroesophageal reflux disease)   . History of kidney stones 07/2006    x 1  . Atrial fibrillation     Past Surgical History  Procedure Laterality Date  . Cardiac catheterization  07/20/2006    No significant CAD, EF 60%    History reviewed. No pertinent family history.  Social History:  reports that he has never smoked. He has never used smokeless tobacco. He reports that  drinks alcohol. He reports that he does not use illicit drugs.  Allergies: No Known Allergies  Medications: Prior to Admission:  1. ASA 81 mg daily 2. Diltiazem 180 mg 3. Metoprolol 100 mg 4. Crestor 10 mg   Review of Systems  Constitutional: Negative for fever, chills and diaphoresis.  HENT: Negative for congestion and sore throat.   Respiratory: Negative for shortness of breath and wheezing.   Cardiovascular: Positive for palpitations. Negative for chest pain, orthopnea, leg swelling and PND.  Gastrointestinal: Negative for nausea, vomiting, abdominal pain, diarrhea, constipation, blood in stool and melena.  Genitourinary: Negative for hematuria.  Neurological: Positive for dizziness. Negative for loss of consciousness and weakness.   Blood pressure 114/39, pulse 84, temperature 97.9 F (36.6 C), temperature source Oral, resp. rate , SpO2  98.00%. Physical Exam  Constitutional: He is oriented to person, place, and time. He appears well-developed and well-nourished. No distress.  HENT:  Head: Normocephalic and atraumatic.  Eyes: Conjunctivae and EOM are normal. Pupils are equal, round, and reactive to light.  Neck: Normal range of motion. Neck supple. No JVD present. Carotid bruit is not present. No thyromegaly present.  Cardiovascular: Intact distal pulses.  An irregularly irregular rhythm present. Exam reveals no gallop and no friction rub.   No murmur  heard. Pulses:      Radial pulses are 2+ on the right side, and 2+ on the left side.       Dorsalis pedis pulses are 2+ on the right side, and 2+ on the left side.  Respiratory: Effort normal and breath sounds normal. No respiratory distress. He has no wheezes. He has no rales. He exhibits no tenderness.  GI: Soft. Bowel sounds are normal. He exhibits no distension and no mass. There is no tenderness.  Musculoskeletal: He exhibits no edema.  Lymphadenopathy:    He has no cervical adenopathy.  Neurological: He is alert and oriented to person, place, and time.  Skin: Skin is warm and dry. He is not diaphoretic.  Psychiatric: He has a normal mood and affect. His behavior is normal.    Assessment/Plan: Principal Problem:   ATRIAL FIBRILLATION w/ RVR of 144bpm 06/19/12 Active Problems:   HYPERLIPIDEMIA  Plan: Pt remains in a-fib on telemetry. His rate is better controlled now in the 80's-90's. He is hemodynamically stable. BP is stable. He denies CP. No SOB.  Keep on IV Diltiazem. Will watch to see if he converts to normal sinus rhythm. If not, then may need to consider elective cardioversion.   His CHADS2 score is 1, which places him at intermediate risk of thromboembolic event. Low dose ASA is an appropriate option. ? May also consider adding Plavix. MD to follow to determine plan of care.  Allayne Butcher, PA-C 06/19/2012, 4:03 PM   I have seen and examined the patient along with BRITTAINY M. SIMMONS, PA-C.  I have reviewed the chart, notes and new data.  I agree with PA's note.  Key new complaints: Just when I was examining the patient he converted to normal sinus rhythm with a rate of approximately 70 beats per minute. He became asymptomatic once his ventricular rate was controlled and remains asymptomatic now that he is in sinus rhythm Key examination changes: Normal cardiac exam without any clinical evidence of congestive heart failure or cardiomegaly or valvular abnormalities Key  new findings / data: Previous extensive workup has included echocardiography coronary angiography and stress testing and there is no evidence of structural heart disease  PLAN: Mr Weitzel appears to have increasingly frequent symptomatic "lone" paroxysmal atrial fibrillation with rapid ventricular response despite the concomitant use of centrally acting calcium channel blockers and beta blockers.  Current treatment regimen is unlikely to provide symptomatic relief. Options at this point include true antiarrhythmic therapy versus radiofrequency ablation for atrial fibrillation, with the latter option being the better long-term solution.  As he does not have congestive heart failure coronary disease or other structural abnormalities he will probably do well with a class I agent such as flecainide 50 mg twice a day. Early followup is needed.   Will also make a referral for evaluation for radiofrequency ablation to Dr. [name]. By the most recent echocardiogram that I find available left atrial diameter was normal at 36 mm and together with his other clinical  characteristics he is likely to have a good response to ablation therapy.  It is possible that he has systemic hypertension that is currently being well "covered up" by his rate control medications. By the more conservative chads 2 scoring system his score of 1 makes him appropriate for aspirin anticoagulation. Using a strict interpretation of the chads asks for his score would place him in a high-risk category, but I believe that biologically this gentleman is much younger than his chronological age. I do not think he truly meets criteria for warfarin anticoagulation or equivalent drugs.  Will discharge from the hospital today. It is recommended that he restrict consumption of caffeine and alcohol.  Thurmon Fair, MD, [facility] [phone] office [phone] pager 06/19/2012 5:26 PM   Thurmon Fair,  MD,  [facility] [phone] 06/19/2012, 5:20 PM

## 2012-06-24 ENCOUNTER — Encounter: Payer: Self-pay | Admitting: Internal Medicine

## 2012-06-28 ENCOUNTER — Other Ambulatory Visit (HOSPITAL_COMMUNITY): Payer: Self-pay | Admitting: Cardiovascular Disease

## 2012-06-28 DIAGNOSIS — I4891 Unspecified atrial fibrillation: Secondary | ICD-10-CM

## 2012-07-06 ENCOUNTER — Ambulatory Visit (HOSPITAL_COMMUNITY)
Admission: RE | Admit: 2012-07-06 | Discharge: 2012-07-06 | Disposition: A | Discharge status: Home / Self Care | Payer: Medicare Other | Source: Ambulatory Visit | Attending: Cardiovascular Disease | Admitting: Cardiovascular Disease

## 2012-07-06 DIAGNOSIS — I4891 Unspecified atrial fibrillation: Secondary | ICD-10-CM | POA: Insufficient documentation

## 2012-07-06 DIAGNOSIS — I059 Rheumatic mitral valve disease, unspecified: Secondary | ICD-10-CM | POA: Insufficient documentation

## 2012-07-06 DIAGNOSIS — E785 Hyperlipidemia, unspecified: Secondary | ICD-10-CM | POA: Insufficient documentation

## 2012-07-06 DIAGNOSIS — I10 Essential (primary) hypertension: Secondary | ICD-10-CM | POA: Insufficient documentation

## 2012-07-06 NOTE — Progress Notes (Signed)
[facility]   2D echo completed 07/06/2012.   Cindy Sherilynn Dieu, RDCS  

## 2012-07-06 NOTE — Procedures (Signed)
     Stress Protocol  Rest HR: 51  Stress HR: 134   Rest BP: 141/81  Stress BP: 180/68   Exercise Time (min): 12:01  METS: 13.40   Predicted Max HR: 162 bpm  % Max HR: 88%  Rate Pressure Product: 16109 Study stopped due to fatigue. No exercise induced chest symptoms.   Rest ECG: NSR - Normal EKG  Stress ECG: No significant change from baseline ECG   Impression Exercise Capacity:  Excellent exercise capacity. BP Response:  Normal blood pressure response. Clinical Symptoms:  No significant symptoms noted. ECG Impression:  No significant ST segment change suggestive of ischemia. No arrhythmia.   Overall Impression:  Normal stress test.     Thurmon Fair, MD  07/06/2012 5:46 PM

## 2012-08-04 ENCOUNTER — Ambulatory Visit (INDEPENDENT_AMBULATORY_CARE_PROVIDER_SITE_OTHER): Payer: Medicare Other | Admitting: Internal Medicine

## 2012-08-04 ENCOUNTER — Encounter: Payer: Self-pay | Admitting: Internal Medicine

## 2012-08-04 VITALS — BP 132/70 | HR 59 | Ht 71.0 in | Wt 179.0 lb

## 2012-08-04 DIAGNOSIS — I10 Essential (primary) hypertension: Secondary | ICD-10-CM

## 2012-08-04 DIAGNOSIS — I4891 Unspecified atrial fibrillation: Secondary | ICD-10-CM

## 2012-08-04 MED ORDER — RIVAROXABAN 20 MG PO TABS: 20.0000 mg | ORAL_TABLET | Freq: Every day | ORAL | Status: DC

## 2012-08-04 MED ORDER — DILTIAZEM HCL ER BEADS 240 MG PO CP24: 240.0000 mg | ORAL_CAPSULE | Freq: Every day | ORAL | Status: DC

## 2012-08-04 NOTE — Patient Instructions (Addendum)
Your physician has recommended that you have an ablation. Catheter ablation is a medical procedure used to treat some cardiac arrhythmias (irregular heartbeats). During catheter ablation, a long, thin, flexible tube is put into a blood vessel in your groin (upper thigh), or neck. This tube is called an ablation catheter. It is then guided to your heart through the blood vessel. Radio frequency waves destroy small areas of heart tissue where abnormal heartbeats may cause an arrhythmia to start. Please see the instruction sheet given to you today.---09/07/12  Labs 08/31/12   Your physician has recommended you make the following change in your medication:  1) Stop Aspirin 2) Start Xarelto 20mg   3) Stop Metoprolol 4) Stasrt Diltiazem 240mg  daily

## 2012-08-04 NOTE — Progress Notes (Signed)
Primary Care Physician: Enrique Sack, MD Referring Physician:  Dr Dennison Luis [name] is a 67 y.o. male with a h/o paroxysmal atrial fibrillation who presents today for EP consultation.    He reports initially being diagnosed with afib in 2008 after presented with palpitations soon after lithotripsy.  He converted to sinus rhythm and was placed on metoprolol and diltiazem.  He did well until 3/14 when he developed recurrent atrial fibrillation.  He reports symptoms of tachypalpitations, presyncope, and weakness.  He presented to [facility] and was evaluated.  He was placed on flecainide 50mg  BID.  He has not well tolerated flecainide due to weakness and lightheadedness.  He reports that he has "skipped" heart beats and finds that they are worse after eating.  He feels that his ectopy is worsened by overeating.  Today, he denies symptoms of chest pain, shortness of breath, orthopnea, PND, lower extremity edema,  syncope, or neurologic sequela. The patient is tolerating medications without difficulties and is otherwise without complaint today.   Past Medical History  Diagnosis Date  . GERD (gastroesophageal reflux disease)   . History of kidney stones 07/2006    x 1  . Paroxysmal atrial fibrillation   . Hyperlipidemia    Past Surgical History  Procedure Laterality Date  . Cardiac catheterization  07/20/2006    No significant CAD, EF 60%    Current Outpatient Prescriptions  Medication Sig Dispense Refill  . aspirin 325 MG buffered tablet Take 81.25 mg by mouth daily. Takes 1/4 of a tablet      . flecainide (TAMBOCOR) 50 MG tablet Take 1 tablet (50 mg total) by mouth 2 (two) times daily.  60 tablet  0  . metoprolol succinate (TOPROL-XL) 100 MG 24 hr tablet Take 100 mg by mouth daily. Take with or immediately following a meal.      . Omega-3 Fatty Acids (FISH OIL PO) Take 1 capsule by mouth at bedtime.      . rosuvastatin (CRESTOR) 10 MG tablet Take 10 mg by mouth daily.        No current facility-administered medications for this visit.    No Known Allergies  History   Social History  . Marital Status: Married    Spouse Name: N/A    Number of Children: N/A  . Years of Education: N/A   Occupational History  . Not on file.   Social History Main Topics  . Smoking status: Never Smoker   . Smokeless tobacco: Never Used  . Alcohol Use: Yes     Comment: 3-4 mixed drinks per day  . Drug Use: No  . Sexually Active: Not on file   Other Topics Concern  . Not on file   Social History Narrative   Lives in [place] with spouse.   Retired Visual merchandiser    Family History  Problem Relation Age of Onset  . CAD Father     ROS- All systems are reviewed and negative except as per the HPI above  Physical Exam: Filed Vitals:   08/04/12 1019  BP: 132/70  Pulse: 59  Height: 5\' 11"  (1.803 m)  Weight: 179 lb (81.194 kg)    GEN- The patient is well appearing, alert and oriented x 3 today.   Head- normocephalic, atraumatic Eyes-  Sclera clear, conjunctiva pink Ears- hearing intact Oropharynx- clear Neck- supple, no JVP Lymph- no cervical lymphadenopathy Lungs- Clear to ausculation bilaterally, normal work of breathing Heart- Regular rate and rhythm, no murmurs,  rubs or gallops, PMI not laterally displaced GI- soft, NT, ND, + BS Extremities- no clubbing, cyanosis, or edema MS- no significant deformity or atrophy Skin- no rash or lesion Psych- euthymic mood, full affect Neuro- strength and sensation are intact  EKG today reveals sinus rhythm 59 bpm, PR 182, incomplete RBBB, otherwise normal ekg 33 pages of records were reviewed from Dr Renelda Mom office today  Echo 07/06/12 reveals EF 55-60%, no significant WMA or valve dysfunction,  LA size 37 mm  Assessment and Plan:

## 2012-08-05 ENCOUNTER — Other Ambulatory Visit: Payer: Self-pay | Admitting: *Deleted

## 2012-08-05 ENCOUNTER — Encounter: Payer: Self-pay | Admitting: *Deleted

## 2012-08-05 DIAGNOSIS — I4891 Unspecified atrial fibrillation: Secondary | ICD-10-CM

## 2012-08-07 ENCOUNTER — Telehealth: Payer: Self-pay | Admitting: Cardiology

## 2012-08-07 NOTE — Telephone Encounter (Signed)
Luis [name] called outside of office hours with concern for increased heart rate.  He is followed by Dr. Allyson Sabal who added flecainide to his medical regimen for new Atrial fib.  He then developed increased premature beats.  He was referred to Dr. Johney Frame for possible a. Fib ablation.  His metoprolol was stopped and diltiazem 240 was added.  He was placed on Xarelto also.  Today his HR is 80-90's faster than his usual but it does not feel like his usual atrial fib.  I asked him to take  180 mg Diltiazem to control HR.  He will do this and call back if any further problems.  He is scheduled for a. Fib ablation 09/07/12.

## 2012-08-10 NOTE — Assessment & Plan Note (Signed)
Stable No change required today  

## 2012-08-10 NOTE — Assessment & Plan Note (Signed)
The patient has symptomatic paroxysmal atrial fibrillation.  He has failed medical therapy with flecainide. He reports fatigue with metoprolol and I will therefore switch metoprolol to diltiazem today. Therapeutic strategies for afib including medicine and ablation were discussed in detail with the patient today. Risk, benefits, and alternatives to EP study and radiofrequency ablation for afib were also discussed in detail today. These risks include but are not limited to stroke, bleeding, vascular damage, tamponade, perforation, damage to the esophagus, lungs, and other structures, pulmonary vein stenosis, worsening renal function, and death. The patient understands these risk and wishes to proceed.  I will stop ASA and start xarelto 20mg  daily today.  We will therefore proceed with catheter ablation once the patient has been adequately anticoagulated.

## 2012-08-17 ENCOUNTER — Encounter: Payer: Self-pay | Admitting: Cardiovascular Disease

## 2012-08-18 ENCOUNTER — Encounter: Payer: Self-pay | Admitting: Cardiovascular Disease

## 2012-08-18 ENCOUNTER — Ambulatory Visit (INDEPENDENT_AMBULATORY_CARE_PROVIDER_SITE_OTHER): Payer: Medicare Other | Admitting: Cardiovascular Disease

## 2012-08-18 VITALS — BP 150/72 | HR 85 | Ht 71.0 in | Wt 177.0 lb

## 2012-08-18 DIAGNOSIS — I10 Essential (primary) hypertension: Secondary | ICD-10-CM

## 2012-08-18 DIAGNOSIS — I480 Paroxysmal atrial fibrillation: Secondary | ICD-10-CM

## 2012-08-18 DIAGNOSIS — I4891 Unspecified atrial fibrillation: Secondary | ICD-10-CM

## 2012-08-18 DIAGNOSIS — E785 Hyperlipidemia, unspecified: Secondary | ICD-10-CM

## 2012-08-18 MED ORDER — ROSUVASTATIN CALCIUM 10 MG PO TABS: 10.0000 mg | ORAL_TABLET | Freq: Every day | ORAL | Status: DC

## 2012-08-18 NOTE — Assessment & Plan Note (Signed)
Currently on diltiazem CD 240 mg daily. His blood pressure is well controlled.

## 2012-08-18 NOTE — Assessment & Plan Note (Signed)
The patient is already on a statin drug followed by his primary care doctor who will check a fasting lipid profile at his next annual visit

## 2012-08-18 NOTE — Assessment & Plan Note (Signed)
He had an appointment with Dr. [name] subsequent to my last visit and based on this an ablation has been scheduled for June 3. He'll have a transesophageal echo performed by Dr. Italy Hilty the day before.

## 2012-08-18 NOTE — Progress Notes (Signed)
08/18/2012 Luis [name]   30-Dec-1945  [id number]  Primary Physician GREEN, Lorenda Ishihara, MD Primary Cardiologist: Runell Gess M.D. FACC  HPI:  The patient is a very pleasant 67 year old, thin and fit-appearing, married Caucasian male, father of 4, grandfather to 2 grandchildren who I last saw 2 months ago. He has a history of paroxysmal atrial fibrillation maintaining sinus rhythm when I saw him in December. He has been evaluated by Dr. [name] at [facility] remotely back in May 2008 and was cath'd by Dr. [name] the month before revealing noncritical CAD with normal LV function. He does have a 35-pack-year history of tobacco abuse having quit August 1996. His other problems include history of hypertension, mild hyperlipidemia and family history of heart disease with a father who had an MI at age 58 and a brother who had stent placed at age 34. He did get occasional palpitations when I saw him back in December. He has had 2 episodes of symptomatic A-fib, the last of which occurred on June 19, 2012, which resulted in presyncope. He noticed this while he was playing golf. When he presented to the ER his heart rate was 144. He was placed on IV diltiazem and ultimately converted to sinus rhythm and was discharged home on flecainide 50 mg p.o. b.i.d. Because his CHADS2 score was 1 it was elected to keep him on aspirin. Left it is that he [name] , scheduled an A. Fib ablation procedure on June 3. The also readjust his medications, stop his beta blocker and increased his diltiazem dose. Plan is for a transesophageal echo to perform by Dr. Italy Hilty the date prior to ablation.      Current Outpatient Prescriptions  Medication Sig Dispense Refill  . diltiazem (TIAZAC) 240 MG 24 hr capsule Take 1 capsule (240 mg total) by mouth daily.  90 capsule  3  . flecainide (TAMBOCOR) 50 MG tablet Take 1 tablet (50 mg total) by mouth 2 (two) times daily.  60  tablet  0  . Omega-3 Fatty Acids (FISH OIL PO) Take 1 capsule by mouth at bedtime.      . Rivaroxaban (XARELTO) 20 MG TABS Take 1 tablet (20 mg total) by mouth daily.  30 tablet  11  . rosuvastatin (CRESTOR) 10 MG tablet Take 1 tablet (10 mg total) by mouth daily.  30 tablet  11   No current facility-administered medications for this visit.    No Known Allergies  History   Social History  . Marital Status: Married    Spouse Name: N/A    Number of Children: N/A  . Years of Education: N/A   Occupational History  . Not on file.   Social History Main Topics  . Smoking status: Former Smoker -- 35 years    Types: Cigarettes    Quit date: 11/27/1994  . Smokeless tobacco: Never Used  . Alcohol Use: 3.0 oz/week    6 drink(s) per week     Comment: 3-4 oz daily  . Drug Use: No  . Sexually Active: Not on file   Other Topics Concern  . Not on file   Social History Narrative   Lives in [place] with spouse.   Retired Visual merchandiser     Review of Systems: General: negative for chills, fever, night sweats or weight changes.  Cardiovascular: negative for chest pain, dyspnea on exertion, edema, orthopnea, palpitations, paroxysmal nocturnal dyspnea or shortness of breath Dermatological: negative for rash Respiratory:  negative for cough or wheezing Urologic: negative for hematuria Abdominal: negative for nausea, vomiting, diarrhea, bright red blood per rectum, melena, or hematemesis Neurologic: negative for visual changes, syncope, or dizziness All other systems reviewed and are otherwise negative except as noted above.    Blood pressure 150/72, pulse 85, height 5\' 11"  (1.803 m), weight 177 lb (80.287 kg).  General appearance: alert and no distress Neck: no adenopathy, no carotid bruit, no JVD, supple, symmetrical, trachea midline and thyroid not enlarged, symmetric, no tenderness/mass/nodules Lungs: clear to auscultation bilaterally Heart: regular rate and rhythm, S1, S2  normal, no murmur, click, rub or gallop Extremities: extremities normal, atraumatic, no cyanosis or edema  EKG normal sinus rhythm at 85 with incomplete right bundle branch block  ASSESSMENT AND PLAN:   HYPERLIPIDEMIA The patient is already on a statin drug followed by his primary care doctor who will check a fasting lipid profile at his next annual visit  HYPERTENSION Currently on diltiazem CD 240 mg daily. His blood pressure is well controlled.  Paroxysmal atrial fibrillation He had an appointment with Dr. [name] subsequent to my last visit and based on this an ablation has been scheduled for June 3. He'll have a transesophageal echo performed by Dr. Italy Hilty the day before.       [address] JMD, Nicholes Calamity, [place] 08/18/2012 10:56 AM

## 2012-08-18 NOTE — Patient Instructions (Addendum)
CONTINUE CURRENT TREATMENT. NO CHANGES

## 2012-08-25 ENCOUNTER — Encounter (HOSPITAL_COMMUNITY): Payer: Self-pay | Admitting: Pharmacy Technician

## 2012-08-31 ENCOUNTER — Other Ambulatory Visit (INDEPENDENT_AMBULATORY_CARE_PROVIDER_SITE_OTHER): Payer: Medicare Other

## 2012-08-31 DIAGNOSIS — I4891 Unspecified atrial fibrillation: Secondary | ICD-10-CM

## 2012-08-31 LAB — CBC WITH DIFFERENTIAL/PLATELET
Basophils Relative: 0.5 % (ref 0.0–3.0)
Hemoglobin: 16.4 g/dL (ref 13.0–17.0)
Lymphocytes Relative: 28.3 % (ref 12.0–46.0)
Monocytes Relative: 7 % (ref 3.0–12.0)
Neutro Abs: 3.5 10*3/uL (ref 1.4–7.7)
RBC: 4.95 Mil/uL (ref 4.22–5.81)

## 2012-08-31 LAB — BASIC METABOLIC PANEL
BUN: 18 mg/dL (ref 6–23)
CO2: 25 mEq/L (ref 19–32)
Chloride: 105 mEq/L (ref 96–112)
GFR: 65.04 mL/min (ref >60.00)
Glucose, Bld: 81 mg/dL (ref 70–99)
Potassium: 3.7 mEq/L (ref 3.5–5.1)

## 2012-09-01 ENCOUNTER — Telehealth: Payer: Self-pay | Admitting: Cardiovascular Disease

## 2012-09-01 NOTE — Telephone Encounter (Signed)
Pt complains of dizziness and weakness x 2 episodes.  The episodes occurred without exertion; they lasted 30 minutes and then resolved. No nausea, no chest pain, no sob.  Heart rate was around 80 and felt regular.  Pt is concerned about these. Please advise.  FYI...Marland Kitchenpatient is scheduled for a TEE on 09-06-12 with [facility] in anticipation of an ablation.

## 2012-09-01 NOTE — Telephone Encounter (Signed)
Luis [name] said he is having problems with his medicine and need your help!

## 2012-09-02 NOTE — Telephone Encounter (Signed)
Dr Allyson Sabal reviewed the chart.  No changes at this time.  Continue to monitor. Pt notified and will call back with continued symptoms

## 2012-09-03 ENCOUNTER — Other Ambulatory Visit: Payer: Self-pay | Admitting: *Deleted

## 2012-09-03 DIAGNOSIS — Z01810 Encounter for preprocedural cardiovascular examination: Secondary | ICD-10-CM

## 2012-09-06 ENCOUNTER — Encounter (HOSPITAL_COMMUNITY): Payer: Self-pay

## 2012-09-06 ENCOUNTER — Ambulatory Visit (HOSPITAL_COMMUNITY)
Admission: RE | Admit: 2012-09-06 | Discharge: 2012-09-06 | Disposition: A | Discharge status: Home / Self Care | Payer: Medicare Other | Source: Ambulatory Visit | Attending: Internal Medicine | Admitting: Internal Medicine

## 2012-09-06 ENCOUNTER — Encounter (HOSPITAL_COMMUNITY)
Admission: RE | Disposition: A | Discharge status: Home / Self Care | Payer: Self-pay | Source: Ambulatory Visit | Attending: Internal Medicine

## 2012-09-06 DIAGNOSIS — I10 Essential (primary) hypertension: Secondary | ICD-10-CM | POA: Insufficient documentation

## 2012-09-06 DIAGNOSIS — Z79899 Other long term (current) drug therapy: Secondary | ICD-10-CM | POA: Insufficient documentation

## 2012-09-06 DIAGNOSIS — Z87891 Personal history of nicotine dependence: Secondary | ICD-10-CM | POA: Insufficient documentation

## 2012-09-06 DIAGNOSIS — Z8249 Family history of ischemic heart disease and other diseases of the circulatory system: Secondary | ICD-10-CM | POA: Insufficient documentation

## 2012-09-06 DIAGNOSIS — I2510 Atherosclerotic heart disease of native coronary artery without angina pectoris: Secondary | ICD-10-CM | POA: Insufficient documentation

## 2012-09-06 DIAGNOSIS — I4891 Unspecified atrial fibrillation: Secondary | ICD-10-CM

## 2012-09-06 DIAGNOSIS — Z01810 Encounter for preprocedural cardiovascular examination: Secondary | ICD-10-CM

## 2012-09-06 DIAGNOSIS — I480 Paroxysmal atrial fibrillation: Secondary | ICD-10-CM

## 2012-09-06 DIAGNOSIS — E785 Hyperlipidemia, unspecified: Secondary | ICD-10-CM | POA: Insufficient documentation

## 2012-09-06 DIAGNOSIS — Z7901 Long term (current) use of anticoagulants: Secondary | ICD-10-CM | POA: Insufficient documentation

## 2012-09-06 HISTORY — PX: TEE WITHOUT CARDIOVERSION: SHX5443

## 2012-09-06 SURGERY — ECHOCARDIOGRAM, TRANSESOPHAGEAL
Anesthesia: Moderate Sedation

## 2012-09-06 MED ORDER — FENTANYL CITRATE 0.05 MG/ML IJ SOLN
INTRAMUSCULAR | Status: AC
  Filled 2012-09-06: qty 2

## 2012-09-06 MED ORDER — LIDOCAINE VISCOUS 2 % MT SOLN
OROMUCOSAL | Status: DC | PRN
  Administered 2012-09-06: 10 mL via OROMUCOSAL

## 2012-09-06 MED ORDER — SODIUM CHLORIDE 0.9 % IV SOLN
INTRAVENOUS | Status: DC
  Administered 2012-09-06: 500 mL via INTRAVENOUS

## 2012-09-06 MED ORDER — LIDOCAINE VISCOUS 2 % MT SOLN
OROMUCOSAL | Status: AC
  Filled 2012-09-06: qty 15

## 2012-09-06 MED ORDER — MIDAZOLAM HCL 5 MG/ML IJ SOLN
INTRAMUSCULAR | Status: AC
  Filled 2012-09-06: qty 2

## 2012-09-06 MED ORDER — FENTANYL CITRATE 0.05 MG/ML IJ SOLN
INTRAMUSCULAR | Status: DC | PRN
  Administered 2012-09-06 (×2): 25 ug via INTRAVENOUS

## 2012-09-06 MED ORDER — BUTAMBEN-TETRACAINE-BENZOCAINE 2-2-14 % EX AERO
INHALATION_SPRAY | CUTANEOUS | Status: DC | PRN
  Administered 2012-09-06: 1 via TOPICAL

## 2012-09-06 MED ORDER — MIDAZOLAM HCL 10 MG/2ML IJ SOLN
INTRAMUSCULAR | Status: DC | PRN
  Administered 2012-09-06: 1 mg via INTRAVENOUS
  Administered 2012-09-06: 2 mg via INTRAVENOUS
  Administered 2012-09-06: 1 mg via INTRAVENOUS

## 2012-09-06 NOTE — H&P (Signed)
   INTERVAL PROCEDURE H&P  History and Physical Interval Note:  09/06/2012 10:22 AM  Luis [name] has presented today for their planned procedure. The various methods of treatment have been discussed with the patient and family. After consideration of risks, benefits and other options for treatment, the patient has consented to the procedure.  The patients' outpatient history has been reviewed, patient examined, and no change in status from most recent office note within the past 30 days. I have reviewed the patients' chart and labs and will proceed as planned. Questions were answered to the patient's satisfaction.   Luis Nose, MD, [facility] Attending Cardiologist [facility]  Luis [name] 09/06/2012, 10:22 AM

## 2012-09-06 NOTE — CV Procedure (Signed)
  TRANSESOPHAGEAL ECHOCARDIOGRAM (TEE) NOTE  INDICATIONS: atrial fibrillation, pre-ablation  PROCEDURE:   Informed consent was obtained prior to the procedure. The risks, benefits and alternatives for the procedure were discussed and the patient comprehended these risks.  Risks include, but are not limited to, cough, sore throat, vomiting, nausea, somnolence, esophageal and stomach trauma or perforation, bleeding, low blood pressure, aspiration, pneumonia, infection, trauma to the teeth and death.    After a procedural time-out, the patient was given 4 mg versed and 50 mcg fentanyl for moderate sedation.  The oropharynx was anesthetized 10 cc of topical 1% viscous lidocaine and 1 cetacaine sprat. The transesophageal probe was inserted in the esophagus and stomach without difficulty and multiple views were obtained.  The patient was kept under observation until the patient left the procedure room.  The patient left the procedure room in stable condition.   Agitated microbubble saline contrast was administered.  COMPLICATIONS:    There were no immediate complications.  Findings:  1. LEFT VENTRICLE: The left ventricular wall thickness is normal.  The left ventricular cavity is normal in size. Wall motion is normal.  LVEF is 55%.  2. RIGHT VENTRICLE:  The right ventricle is normal in structure and function without any thrombus or masses.    3. LEFT ATRIUM:  The left atrium is dilated in size without any thrombus or masses.  There is not spontaneous echo contrast ("smoke") in the left atrium consistent with a low flow state.  4. LEFT ATRIAL APPENDAGE:  The left atrial appendage is free of any thrombus or masses. The appendage has single lobes. Pulse doppler indicates moderate flow in the appendage.  5. ATRIAL SEPTUM:  The atrial septum appears intact and is free of thrombus and/or masses.  There is no evidence for interatrial shunting by color doppler and saline microbubble.  6. RIGHT ATRIUM:   The right atrium is normal in size and function without any thrombus or masses.  7. MITRAL VALVE:  The mitral valve is normal in structure and function with trace regurgitation.  There were no vegetations or stenosis.  8. AORTIC VALVE:  The aortic valve is normal in structure and function with no regurgitation.  There were no vegetations or stenosis  9. TRICUSPID VALVE:  The tricuspid valve is normal in structure and function with trace regurgitation.  There were no vegetations or stenosis  10.  PULMONIC VALVE:  The tricuspid valve is normal in structure and function with no regurgitation.  There were no vegetations or stenosis.   11. AORTIC ARCH, ASCENDING AND DESCENDING AORTA:  There was no atherosclerosis of the ascending aorta, aortic arch, or proximal descending aorta.  IMPRESSION:   1. No LAA thrombus, mildly dilated LA 2. Negative bubble study for intra-atrial shunting 3. Normal LV systolic function 4. No significant valvular abnormalities  RECOMMENDATIONS:    1. Continue anticoagulation with xarelto. 2. A-fib ablation per Dr. Johney Frame.  Time Spent Directly with the Patient:  30 minutes   Chrystie Nose, MD, [facility] Attending Cardiologist [facility]  09/06/2012, 11:32 AM

## 2012-09-06 NOTE — Progress Notes (Signed)
  Echocardiogram Echocardiogram Transesophageal has been performed.  [place] 09/06/2012, 11:56 AM

## 2012-09-06 NOTE — Progress Notes (Signed)
Quick Note:  Preliminary report reviewed by triage nurse and sent to MD desk. ______ 

## 2012-09-07 ENCOUNTER — Encounter (HOSPITAL_COMMUNITY): Payer: Self-pay | Admitting: *Deleted

## 2012-09-07 ENCOUNTER — Encounter (HOSPITAL_COMMUNITY)
Admission: RE | Disposition: A | Discharge status: Home / Self Care | Payer: Self-pay | Source: Ambulatory Visit | Attending: Internal Medicine

## 2012-09-07 ENCOUNTER — Encounter (HOSPITAL_COMMUNITY): Payer: Self-pay | Admitting: Anesthesiology

## 2012-09-07 ENCOUNTER — Ambulatory Visit (HOSPITAL_COMMUNITY): Payer: Medicare Other | Admitting: *Deleted

## 2012-09-07 ENCOUNTER — Ambulatory Visit (HOSPITAL_COMMUNITY)
Admission: RE | Admit: 2012-09-07 | Discharge: 2012-09-08 | Disposition: A | Discharge status: Home / Self Care | Payer: Medicare Other | Source: Ambulatory Visit | Attending: Internal Medicine | Admitting: Internal Medicine

## 2012-09-07 DIAGNOSIS — I480 Paroxysmal atrial fibrillation: Secondary | ICD-10-CM | POA: Diagnosis present

## 2012-09-07 DIAGNOSIS — I4891 Unspecified atrial fibrillation: Secondary | ICD-10-CM

## 2012-09-07 DIAGNOSIS — K219 Gastro-esophageal reflux disease without esophagitis: Secondary | ICD-10-CM | POA: Insufficient documentation

## 2012-09-07 DIAGNOSIS — E785 Hyperlipidemia, unspecified: Secondary | ICD-10-CM | POA: Insufficient documentation

## 2012-09-07 DIAGNOSIS — I10 Essential (primary) hypertension: Secondary | ICD-10-CM | POA: Insufficient documentation

## 2012-09-07 HISTORY — PX: ATRIAL ABLATION SURGERY: SHX560

## 2012-09-07 HISTORY — PX: ATRIAL FIBRILLATION ABLATION: SHX5456

## 2012-09-07 LAB — POCT ACTIVATED CLOTTING TIME: Activated Clotting Time: 312 seconds

## 2012-09-07 SURGERY — ATRIAL FIBRILLATION ABLATION
Anesthesia: Monitor Anesthesia Care

## 2012-09-07 MED ORDER — LACTATED RINGERS IV SOLN
INTRAVENOUS | Status: DC | PRN
  Administered 2012-09-07: 07:00:00 via INTRAVENOUS

## 2012-09-07 MED ORDER — ACETAMINOPHEN 325 MG PO TABS: 650.0000 mg | ORAL_TABLET | ORAL | Status: DC | PRN

## 2012-09-07 MED ORDER — ONDANSETRON HCL 4 MG/2ML IJ SOLN: 4.0000 mg | Freq: Once | INTRAMUSCULAR | Status: AC | PRN

## 2012-09-07 MED ORDER — SODIUM CHLORIDE 0.9 % IV SOLN: 250.0000 mL | INTRAVENOUS | Status: DC | PRN

## 2012-09-07 MED ORDER — ONDANSETRON HCL 4 MG/2ML IJ SOLN: 4.0000 mg | Freq: Four times a day (QID) | INTRAMUSCULAR | Status: DC | PRN

## 2012-09-07 MED ORDER — DILTIAZEM HCL ER BEADS 240 MG PO CP24
240.0000 mg | ORAL_CAPSULE | Freq: Every day | ORAL | Status: DC
  Administered 2012-09-07 – 2012-09-08 (×2): 240 mg via ORAL
  Filled 2012-09-07 (×2): qty 1

## 2012-09-07 MED ORDER — SODIUM CHLORIDE 0.9 % IJ SOLN
3.0000 mL | Freq: Two times a day (BID) | INTRAMUSCULAR | Status: DC
  Administered 2012-09-07: 3 mL via INTRAVENOUS

## 2012-09-07 MED ORDER — PROTAMINE SULFATE 10 MG/ML IV SOLN
INTRAVENOUS | Status: DC | PRN
  Administered 2012-09-07: 30 mg via INTRAVENOUS

## 2012-09-07 MED ORDER — ONDANSETRON HCL 4 MG/2ML IJ SOLN
INTRAMUSCULAR | Status: DC | PRN
  Administered 2012-09-07: 4 mg via INTRAVENOUS

## 2012-09-07 MED ORDER — HEPARIN SODIUM (PORCINE) 1000 UNIT/ML IJ SOLN
INTRAMUSCULAR | Status: DC | PRN
  Administered 2012-09-07: 3000 [IU] via INTRAVENOUS
  Administered 2012-09-07: 12000 [IU] via INTRAVENOUS

## 2012-09-07 MED ORDER — HYDROCODONE-ACETAMINOPHEN 5-325 MG PO TABS: 1.0000 | ORAL_TABLET | ORAL | Status: DC | PRN

## 2012-09-07 MED ORDER — FENTANYL CITRATE 0.05 MG/ML IJ SOLN
INTRAMUSCULAR | Status: DC | PRN
  Administered 2012-09-07 (×4): 25 ug via INTRAVENOUS

## 2012-09-07 MED ORDER — PROPOFOL 10 MG/ML IV BOLUS
INTRAVENOUS | Status: DC | PRN
  Administered 2012-09-07: 150 mg via INTRAVENOUS
  Administered 2012-09-07: 10 mg via INTRAVENOUS

## 2012-09-07 MED ORDER — RIVAROXABAN 20 MG PO TABS
20.0000 mg | ORAL_TABLET | Freq: Every day | ORAL | Status: DC
  Administered 2012-09-07: 20 mg via ORAL
  Filled 2012-09-07 (×2): qty 1

## 2012-09-07 MED ORDER — HYDROXYUREA 500 MG PO CAPS
ORAL_CAPSULE | ORAL | Status: AC
  Filled 2012-09-07: qty 1

## 2012-09-07 MED ORDER — SODIUM CHLORIDE 0.9 % IJ SOLN: 3.0000 mL | INTRAMUSCULAR | Status: DC | PRN

## 2012-09-07 MED ORDER — DEXAMETHASONE SODIUM PHOSPHATE 10 MG/ML IJ SOLN
INTRAMUSCULAR | Status: DC | PRN
  Administered 2012-09-07: 4 mg via INTRAVENOUS

## 2012-09-07 MED ORDER — BUPIVACAINE HCL (PF) 0.25 % IJ SOLN
INTRAMUSCULAR | Status: AC
  Filled 2012-09-07: qty 30

## 2012-09-07 MED ORDER — LIDOCAINE HCL (CARDIAC) 20 MG/ML IV SOLN
INTRAVENOUS | Status: DC | PRN
  Administered 2012-09-07: 50 mg via INTRAVENOUS

## 2012-09-07 MED ORDER — PANTOPRAZOLE SODIUM 40 MG PO TBEC
40.0000 mg | DELAYED_RELEASE_TABLET | Freq: Every day | ORAL | Status: DC
  Administered 2012-09-08: 40 mg via ORAL
  Filled 2012-09-07: qty 1

## 2012-09-07 MED ORDER — HYDROMORPHONE HCL PF 1 MG/ML IJ SOLN: 0.2500 mg | INTRAMUSCULAR | Status: DC | PRN

## 2012-09-07 MED ORDER — FLECAINIDE ACETATE 50 MG PO TABS
50.0000 mg | ORAL_TABLET | Freq: Two times a day (BID) | ORAL | Status: DC
  Administered 2012-09-07 (×2): 50 mg via ORAL
  Filled 2012-09-07 (×4): qty 1

## 2012-09-07 MED ORDER — HEPARIN SODIUM (PORCINE) 1000 UNIT/ML IJ SOLN
INTRAMUSCULAR | Status: AC
  Filled 2012-09-07: qty 1

## 2012-09-07 NOTE — Anesthesia Postprocedure Evaluation (Signed)
  Anesthesia Post-op Note  Patient: Luis [name]  Procedure(s) Performed: Procedure(s): ATRIAL FIBRILLATION ABLATION (N/A)  Patient Location: Cath Lab  Anesthesia Type:General  Level of Consciousness: awake, alert , oriented and patient cooperative  Airway and Oxygen Therapy: Patient Spontanous Breathing  Post-op Pain: none  Post-op Assessment: Post-op Vital signs reviewed, Patient's Cardiovascular Status Stable, Respiratory Function Stable, Patent Airway, No signs of Nausea or vomiting and Pain level controlled  Post-op Vital Signs: stable  Complications: No apparent anesthesia complications

## 2012-09-07 NOTE — Transfer of Care (Signed)
Immediate Anesthesia Transfer of Care Note  Patient: Luis [name]  Procedure(s) Performed: Procedure(s): ATRIAL FIBRILLATION ABLATION (N/A)  Patient Location: PACU  Anesthesia Type:General  Level of Consciousness: awake, alert , oriented, patient cooperative and responds to stimulation  Airway & Oxygen Therapy: Patient Spontanous Breathing and Patient connected to nasal cannula oxygen  Post-op Assessment: Report given to PACU RN, Post -op Vital signs reviewed and stable and Patient moving all extremities X 4  Post vital signs: Reviewed and stable  Complications: No apparent anesthesia complications

## 2012-09-07 NOTE — Anesthesia Preprocedure Evaluation (Signed)
Anesthesia Evaluation  Patient identified by MRN, date of birth, ID band Patient awake    Reviewed: Allergy & Precautions, H&P , NPO status , Patient's Chart, lab work & pertinent test results  Airway Mallampati: I TM Distance: >3 FB Neck ROM: full    Dental   Pulmonary          Cardiovascular hypertension, + CAD + dysrhythmias Atrial Fibrillation Rhythm:irregular Rate:Normal     Neuro/Psych    GI/Hepatic GERD-  ,  Endo/Other    Renal/GU      Musculoskeletal   Abdominal   Peds  Hematology   Anesthesia Other Findings   Reproductive/Obstetrics                           Anesthesia Physical Anesthesia Plan  ASA: III  Anesthesia Plan: MAC and General   Post-op Pain Management:    Induction: Intravenous  Airway Management Planned: LMA  Additional Equipment:   Intra-op Plan:   Post-operative Plan: Extubation in OR  Informed Consent: I have reviewed the patients History and Physical, chart, labs and discussed the procedure including the risks, benefits and alternatives for the proposed anesthesia with the patient or authorized representative who has indicated his/her understanding and acceptance.     Plan Discussed with: CRNA, Anesthesiologist and Surgeon  Anesthesia Plan Comments:         Anesthesia Quick Evaluation

## 2012-09-07 NOTE — Op Note (Addendum)
SURGEON:  [name], MD  PREPROCEDURE DIAGNOSES: 1. Paroxysmal atrial fibrillation.  POSTPROCEDURE DIAGNOSES: 1. Paroxysmal  atrial fibrillation.  PROCEDURES: 1. Comprehensive electrophysiologic study. 2. Coronary sinus pacing and recording. 3. Three-dimensional mapping of atrial fibrillation with additional mapping and ablation of a second discrete focus 4. Ablation of atrial fibrillation with additional mapping and ablation of a second discrete focus 5. Intracardiac echocardiography. 6. Transseptal puncture of an intact septum. 7. Rotational Angiography with processing at an independent workstation 8. Arrhythmia induction with pacing with isuprel infusion  INTRODUCTION:  Luis [name] is a 67 y.o. male with a history of paroxysmal atrial fibrillation who now presents for EP study and radiofrequency ablation.  The patient reports initially being diagnosed with atrial fibrillation after presenting with symptomatic palpitations and fatgiue. The patient reports increasing frequency and duration of atrial fibrillation since that time.  The patient has failed medical therapy with flecainide.  The patient therefore presents today for catheter ablation of atrial fibrillation.  DESCRIPTION OF PROCEDURE:  Informed written consent was obtained, and the patient was brought to the electrophysiology lab in a fasting state.  The patient was adequately sedated with intravenous medications as outlined in the anesthesia report.  The patient's left and right groins were prepped and draped in the usual sterile fashion by the EP lab staff.  Using a percutaneous Seldinger technique, two 7-French and one 11-French hemostasis sheaths were placed into the right common femoral vein.  3 Dimensional Rotational Angiography: A 5 french pigtail catheter was introduced through the right common femoral vein and advanced into the inferior venocava.  3 demential rotational angiography was then performed by power  injection of 100cc of nonionic contrast.  Reprocessing at an independent work station was then performed.   Unfortunately, due to technical difficulty, a 3D reconstruction could not be generated today.  The pigtail catheter was then removed.  Catheter Placement:  A 7-French Biosense Webster Decapolar coronary sinus catheter was introduced through the right common femoral vein and advanced into the coronary sinus for recording and pacing from this location.  A 6-French quadripolar Josephson catheter was introduced through the right common femoral vein and advanced into the right ventricle for recording and pacing.  This catheter was then pulled back to the His bundle location.    Initial Measurements: The patient presented to the electrophysiology lab in sinus rhythm.  His PR interval measured 189 msec with a QRS duration of 114 msec and a QT interval of 474 msec.  The AH interval measured 80 msec and the HV interval measured 53 msec.     Intracardiac Echocardiography: A 10-French Biosense Webster AcuNav intracardiac echocardiography catheter was introduced through the left common femoral vein and advanced into the right atrium. Intracardiac echocardiography was performed of the left atrium, and a three-dimensional anatomical rendering of the left atrium was performed using CARTO sound technology.  The patient was noted to have a moderate sized left atrium.  The interatrial septum was prominent but not aneurysmal. All 4 pulmonary veins were visualized and noted to have separate ostia.  The pulmonary veins were moderate in size.  The left atrial appendage was visualized and did not reveal thrombus.   There was no evidence of pulmonary vein stenosis.   Transseptal Puncture: The middle right common femoral vein sheath was exchanged for an 8.5 [place] SL2 transseptal sheath and transseptal access was achieved in a standard fashion using a Brockenbrough needle under biplane fluoroscopy with intracardiac  echocardiography confirmation of the transseptal puncture.  Once transseptal access had been achieved, heparin was administered intravenously and intra- arterially in order to maintain an ACT of greater than 300 seconds throughout the procedure.   3D Mapping and Ablation: The His bundle catheter was removed and in its place a 3.5 mm [employer] Thermocool ablation catheter was advanced into the right atrium.  The transseptal sheath was pulled back into the IVC over a guidewire.  The ablation catheter was advanced across the transseptal hole using the wire as a guide.  The transseptal sheath was then re-advanced over the guidewire into the left atrium.  A duodecapolar Biosense Webster circular mapping catheter was introduced through the transseptal sheath and positioned over the mouth of all 4 pulmonary veins.  Three-dimensional electroanatomical mapping was performed using CARTO technology.  This demonstrated electrical activity within all four pulmonary veins at baseline. The patient underwent successful sequential electrical isolation and anatomical encircling of all four pulmonary veins using radiofrequency current with a circular mapping catheter as a guide.   The circular mapping catheter was pulled back into the right atrium and 3D mapping was performed at the junction of the superior vena cava and right atrium.  Electrical activity was observed within the SVC.  I therefore elected to perform right atrial ablation in this area.  A series of radiofrequency applications were delivered in a circular fashion around the ostium of the SVC.  Prior to each ablation lesions, pacing was performed from the distal ablation electrode to insure that diaphragmatic stimulation was not observed to avoid phrenic nerve injury.  Diaphragmatic excursion was also observed during ablation.        Measurements Following Ablation: Following ablation, Isuprel was infused up to 20 mcg/min with no inducible  atrial fibrillation, atrial tachycardia, atrial flutter, or sustained PACs. In sinus rhythm with RR interval was 1158 msec, with PR 183 msec, QRS 92 msec, and Qtc 410 msec.  Following ablation the AH interval measured 102 msec with an HV interval of 48 msec. Ventricular pacing was performed, which revealed VA dissociation when pacing at 600 msec.  Rapid atrial pacing was performed, which revealed an AV Wenckebach cycle length of 350 msec.  Electroisolation was then again confirmed in all four pulmonary veins.  Pacing was performed along the ablation line which confirmed entrance and exit block.  The procedure was therefore considered completed.  All catheters were removed, and the sheaths were aspirated and flushed.  The patient was transferred to the recovery area for sheath removal per protocol.  A limited bedside transthoracic echocardiogram revealed no pericardial effusion.  There were no early apparent complications.  CONCLUSIONS: 1. Sinus rhythm upon presentation.   2. Intracardiac echocardiography reveals a moderate sized left atrium with four separate pulmonary veins without evidence of pulmonary vein stenosis. 3. Successful electrical isolation and anatomical encircling of all four pulmonary veins with radiofrequency current. Additional mapping and ablation was performed at the SVC/RA junction 4. No inducible arrhythmias following ablation both on and off of Isuprel 5. No early apparent complications.   Fayrene Fearing Arnoldo Hildreth,MD 10:43 AM 09/07/2012

## 2012-09-07 NOTE — Discharge Summary (Addendum)
ELECTROPHYSIOLOGY PROCEDURE DISCHARGE SUMMARY    Patient ID: Luis [name],  MRN: [id number], DOB/AGE: 1946-03-13 67 y.o.  Admit date: 09/07/2012 Discharge date: 09/08/2012  Primary Care Physician: Nila Nephew, MD Primary Cardiologist: [name], MD Electrophysiologist: [name], MD  Primary Discharge Diagnosis:  Paroxysmal atrial fibrillation status post electrophysiology study and radiofrequency catheter ablation this admission  Secondary Discharge Diagnosis:  1.  GERD 2.  Hyperlipidemia  Procedures This Admission:  1.  Electrophysiology study and radiofrequency catheter ablation on 09-07-2012 by Dr Johney Frame.  This study demonstrated sinus rhythm upon presentation; intracardiac echocardiography reveals a moderate sized left atrium with four separate pulmonary veins without evidence of pulmonary vein stenosis; successful electrical isolation and anatomical encircling of all four pulmonary veins with radiofrequency current. Additional mapping and ablation was performed at the SVC/RA junction; no inducible arrhythmias following ablation both on and off of Isuprel; no early apparent complications.  Brief HPI: [name] is a 67 year old male who was referred to Dr Johney Frame for evaluation of atrial fibrillation.  He was initially diagnosed with atrial fib in 2008.  He has failed medical therapy with Flecainide.  He has had increasing symptoms of tachypalpitations, presyncope, and weakness.  Treatment options were discussed with the patient who wished to pursue ablation therapy.  The patient was initiated on Xarelto at that time.  He underwent TEE by Dr Rennis Golden the day before the procedure which demonstrated mildly dilated LA, no LAA thrombus, normal LV function and no significant valvular abnormalities.   Hospital Course:  The patient was admitted for planned ablation of atrial fibrillation.  This was carried out by Dr Johney Frame with details as outlined above.  He was monitored on telemetry  overnight which demonstrated sinus rhythm. His groin incision was without complication.  He was examined by Dr Johney Frame and considered him stable for discharge.  Because of symptoms related to Flecainide, this medication was discontinued on discharge.    Physical Exam: Filed Vitals:   09/07/12 1937 09/07/12 2355 09/08/12 0405 09/08/12 0741  BP: 145/76 123/63 112/54 115/63  Pulse: 81  74   Temp: 98 F (36.7 C) 97.7 F (36.5 C) 98.2 F (36.8 C) 98.1 F (36.7 C)  TempSrc: Oral Oral Oral Oral  Resp: 14 16 17 10   Height:      Weight:      SpO2: 96% 96% 96% 95%    GEN- The patient is well appearing, alert and oriented x 3 today.   Head- normocephalic, atraumatic Eyes-  Sclera clear, conjunctiva pink Ears- hearing intact Oropharynx- clear Neck- supple, no JVP Lymph- no cervical lymphadenopathy Lungs- Clear to ausculation bilaterally, normal work of breathing Heart- Regular rate and rhythm, no murmurs, rubs or gallops, PMI not laterally displaced GI- soft, NT, ND, + BS Extremities- no clubbing, cyanosis, or edema, no hematoma/ bruit MS- no significant deformity or atrophy Skin- no rash or lesion Psych- euthymic mood, full affect Neuro- strength and sensation are intact   Labs:   Lab Results  Component Value Date   WBC 5.7 08/31/2012   HGB 16.4 08/31/2012   HCT 47.0 08/31/2012   MCV 95.0 08/31/2012   PLT 191.0 08/31/2012     Discharge Medications:    Medication List    STOP taking these medications       flecainide 50 MG tablet  Commonly known as:  TAMBOCOR     omeprazole 20 MG capsule  Commonly known as:  PRILOSEC  Replaced by:  pantoprazole 40 MG tablet  TAKE these medications       diltiazem 240 MG 24 hr capsule  Commonly known as:  TIAZAC  Take 1 capsule (240 mg total) by mouth daily.     Fish Oil 1000 MG Caps  Take 1 capsule by mouth at bedtime.     pantoprazole 40 MG tablet  Commonly known as:  PROTONIX  Take 1 tablet (40 mg total) by mouth daily with  breakfast.     Rivaroxaban 20 MG Tabs  Commonly known as:  XARELTO  Take 1 tablet (20 mg total) by mouth daily.     rosuvastatin 10 MG tablet  Commonly known as:  CRESTOR  Take 1 tablet (10 mg total) by mouth daily.        Disposition:   Future Appointments Provider Department Dept Phone   12/08/2012 4:00 PM [name], MD [place] [facility] Main Office [place]) [phone]     Follow-up Information   Follow up with [name], MD On 12/08/2012. (At 4:00 PM)    Contact information:   [address] [place] [address] [phone]      Duration of Discharge Encounter: Greater than 30 minutes including physician time.  Signed, [name], MD

## 2012-09-07 NOTE — Preoperative (Signed)
Beta Blockers   Reason not to administer Beta Blockers:Not Applicable 

## 2012-09-07 NOTE — H&P (Signed)
Primary Care Physician: Enrique Sack, MD Referring Physician:  Dr Dennison Nancy is a 67 y.o. male with a h/o paroxysmal atrial fibrillation who presents today for EPS and ablation for afib.     He reports initially being diagnosed with afib in 2008 after presented with palpitations soon after lithotripsy.  He converted to sinus rhythm and was placed on metoprolol and diltiazem.  He did well until 3/14 when he developed recurrent atrial fibrillation.  He reports symptoms of tachypalpitations, presyncope, and weakness.  He presented to [facility] and was evaluated.  He was placed on flecainide 50mg  BID.  He has not well tolerated flecainide due to weakness and lightheadedness.  He reports that he has "skipped" heart beats and finds that they are worse after eating.  He feels that his ectopy is worsened by overeating.  Today, he denies symptoms of chest pain, shortness of breath, orthopnea, PND, lower extremity edema,  syncope, or neurologic sequela. The patient is tolerating medications without difficulties and is otherwise without complaint today.     Past Medical History   Diagnosis  Date   .  GERD (gastroesophageal reflux disease)     .  History of kidney stones  07/2006       x 1   .  Paroxysmal atrial fibrillation     .  Hyperlipidemia        Past Surgical History   Procedure  Laterality  Date   .  Cardiac catheterization    07/20/2006       No significant CAD, EF 60%    Current Outpatient Prescriptions   Medication  Sig  Dispense  Refill   .  aspirin 325 MG buffered tablet  Take 81.25 mg by mouth daily. Takes 1/4 of a tablet         .  flecainide (TAMBOCOR) 50 MG tablet  Take 1 tablet (50 mg total) by mouth 2 (two) times daily.   60 tablet   0   .  metoprolol succinate (TOPROL-XL) 100 MG 24 hr tablet  Take 100 mg by mouth daily. Take with or immediately following a meal.         .  Omega-3 Fatty Acids (FISH OIL PO)  Take 1 capsule by mouth at bedtime.         .   rosuvastatin (CRESTOR) 10 MG tablet  Take 10 mg by mouth daily.          No Known Allergies    History    Social History   .  Marital Status:  Married       Spouse Name:  N/A       Number of Children:  N/A   .  Years of Education:  N/A       Occupational History   .  Not on file.       Social History Main Topics   .  Smoking status:  Never Smoker    .  Smokeless tobacco:  Never Used   .  Alcohol Use:  Yes         Comment: 3-4 mixed drinks per day   .  Drug Use:  No   .  Sexually Active:  Not on file       Other Topics  Concern   .  Not on file       Social History Narrative     Lives in [place] with spouse.     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  CAD  Father       ROS- All systems are reviewed and negative except as per the HPI above   Physical Exam: GEN- The patient is well appearing, alert and oriented x 3 today.    Head- normocephalic, atraumatic Eyes-  Sclera clear, conjunctiva pink Ears- hearing intact Oropharynx- clear Neck- supple, no JVP Lymph- no cervical lymphadenopathy Lungs- Clear to ausculation bilaterally, normal work of breathing Heart- Regular rate and rhythm, no murmurs, rubs or gallops, PMI not laterally displaced GI- soft, NT, ND, + BS Extremities- no clubbing, cyanosis, or edema MS- no significant deformity or atrophy Skin- no rash or lesion Psych- euthymic mood, full affect Neuro- strength and sensation are intact  Echo 07/06/12 reveals EF 55-60%, no significant WMA or valve dysfunction,  LA size 37 mm TEE reviewed  Assessment and Plan:     Paroxysmal atrial fibrillation  The patient has symptomatic paroxysmal atrial fibrillation.  He has failed medical therapy with flecainide. He reports fatigue with metoprolol and I will therefore switch metoprolol to diltiazem today. Therapeutic strategies for afib including medicine and ablation were discussed in detail with the  patient today. Risk, benefits, and alternatives to EP study and radiofrequency ablation for afib were also discussed in detail today. These risks include but are not limited to stroke, bleeding, vascular damage, tamponade, perforation, damage to the esophagus, lungs, and other structures, pulmonary vein stenosis, worsening renal function, and death. The patient understands these risk and wishes to proceed.

## 2012-09-08 DIAGNOSIS — I4891 Unspecified atrial fibrillation: Secondary | ICD-10-CM

## 2012-09-08 LAB — BASIC METABOLIC PANEL
CO2: 27 mEq/L (ref 19–32)
Calcium: 9.3 mg/dL (ref 8.4–10.5)
Chloride: 107 mEq/L (ref 96–112)
Potassium: 4.2 mEq/L (ref 3.5–5.1)
Sodium: 142 mEq/L (ref 135–145)

## 2012-09-08 MED ORDER — PANTOPRAZOLE SODIUM 40 MG PO TBEC: 40.0000 mg | DELAYED_RELEASE_TABLET | Freq: Every day | ORAL | Status: DC

## 2012-09-09 ENCOUNTER — Telehealth: Payer: Self-pay | Admitting: Internal Medicine

## 2012-09-09 NOTE — Telephone Encounter (Signed)
New problem    Pt calling about ablation that he had-thinks there may be a problem

## 2012-09-09 NOTE — Telephone Encounter (Signed)
Spoke with patient, he had an ablation on 09/07/12 and his Flecainide was stopped.  He has been doing great until today at 1:30.  He noticed he was out of rhythm and his HR was 120-130.  He took a 50mg  Flecainide and it is still out.  I have discussed with Dr Johney Frame.  He is going to take an additional 50mg  now and then start taking 50mg  bid again tomorrow.  I he is still out of rhythm on Mon he will call me and we can set him up for a DCCV

## 2012-09-10 ENCOUNTER — Encounter (HOSPITAL_COMMUNITY): Payer: Self-pay | Admitting: Internal Medicine

## 2012-09-10 ENCOUNTER — Other Ambulatory Visit: Payer: Self-pay | Admitting: *Deleted

## 2012-09-10 ENCOUNTER — Observation Stay (HOSPITAL_COMMUNITY)
Admission: EM | Admit: 2012-09-10 | Discharge: 2012-09-11 | Disposition: A | Discharge status: Home / Self Care | Payer: Medicare Other | Attending: Internal Medicine | Admitting: Internal Medicine

## 2012-09-10 ENCOUNTER — Telehealth: Payer: Self-pay | Admitting: Internal Medicine

## 2012-09-10 DIAGNOSIS — E785 Hyperlipidemia, unspecified: Secondary | ICD-10-CM | POA: Insufficient documentation

## 2012-09-10 DIAGNOSIS — I10 Essential (primary) hypertension: Secondary | ICD-10-CM | POA: Insufficient documentation

## 2012-09-10 DIAGNOSIS — I480 Paroxysmal atrial fibrillation: Secondary | ICD-10-CM

## 2012-09-10 DIAGNOSIS — R002 Palpitations: Secondary | ICD-10-CM | POA: Insufficient documentation

## 2012-09-10 DIAGNOSIS — I4891 Unspecified atrial fibrillation: Secondary | ICD-10-CM | POA: Insufficient documentation

## 2012-09-10 DIAGNOSIS — I4892 Unspecified atrial flutter: Principal | ICD-10-CM | POA: Insufficient documentation

## 2012-09-10 DIAGNOSIS — Z7901 Long term (current) use of anticoagulants: Secondary | ICD-10-CM | POA: Insufficient documentation

## 2012-09-10 DIAGNOSIS — I441 Atrioventricular block, second degree: Secondary | ICD-10-CM | POA: Insufficient documentation

## 2012-09-10 MED ORDER — FLECAINIDE ACETATE 50 MG PO TABS: 50.0000 mg | ORAL_TABLET | Freq: Two times a day (BID) | ORAL | Status: DC

## 2012-09-10 NOTE — ED Notes (Signed)
The pt has had tachycardia since yesterday.  He had an ablation on Tuesday and he was taken off flicanide.  His tachycardia started again yesterday and when he took the flicanide it stopped.  The tachy returned approx 2 hours ago.  No pain some sob

## 2012-09-10 NOTE — Telephone Encounter (Signed)
Sent back into NSR rhythm last night at 6pm and if going to continue all his medications and will call if he goes back out of rhythm again

## 2012-09-10 NOTE — ED Notes (Signed)
Old and new EKG given to Dr Wickline. 

## 2012-09-10 NOTE — Telephone Encounter (Signed)
New Prob      Pt calling to give nurse update on his currently condition. Please call.

## 2012-09-11 ENCOUNTER — Encounter (HOSPITAL_COMMUNITY): Payer: Self-pay | Admitting: Emergency Medicine

## 2012-09-11 DIAGNOSIS — I4892 Unspecified atrial flutter: Secondary | ICD-10-CM

## 2012-09-11 LAB — CBC
MCH: 33.6 pg (ref 26.0–34.0)
Platelets: 147 10*3/uL — ABNORMAL LOW (ref 150–400)
RBC: 5.24 MIL/uL (ref 4.22–5.81)
WBC: 7.6 10*3/uL (ref 4.0–10.5)

## 2012-09-11 LAB — POCT I-STAT TROPONIN I: Troponin i, poc: 0.04 ng/mL (ref 0.00–0.08)

## 2012-09-11 LAB — BASIC METABOLIC PANEL
BUN: 15 mg/dL (ref 6–23)
CO2: 28 mEq/L (ref 19–32)
Calcium: 10 mg/dL (ref 8.4–10.5)
Chloride: 103 mEq/L (ref 96–112)
GFR calc Af Amer: 73 mL/min — ABNORMAL LOW (ref >90)
GFR calc non Af Amer: 63 mL/min — ABNORMAL LOW (ref >90)
Potassium: 3.5 mEq/L (ref 3.5–5.1)

## 2012-09-11 MED ORDER — RIVAROXABAN 20 MG PO TABS
20.0000 mg | ORAL_TABLET | Freq: Every day | ORAL | Status: DC
  Administered 2012-09-11: 20 mg via ORAL
  Filled 2012-09-11: qty 1

## 2012-09-11 MED ORDER — SODIUM CHLORIDE 0.9 % IV BOLUS (SEPSIS)
1000.0000 mL | Freq: Once | INTRAVENOUS | Status: AC
  Administered 2012-09-11: 1000 mL via INTRAVENOUS

## 2012-09-11 MED ORDER — DILTIAZEM HCL 100 MG IV SOLR
5.0000 mg/h | INTRAVENOUS | Status: DC
  Administered 2012-09-11: 5 mg/h via INTRAVENOUS
  Filled 2012-09-11: qty 100

## 2012-09-11 MED ORDER — FLECAINIDE ACETATE 50 MG PO TABS
50.0000 mg | ORAL_TABLET | Freq: Two times a day (BID) | ORAL | Status: DC
  Filled 2012-09-11 (×2): qty 1

## 2012-09-11 MED ORDER — SODIUM CHLORIDE 0.9 % IJ SOLN: 3.0000 mL | Freq: Two times a day (BID) | INTRAMUSCULAR | Status: DC

## 2012-09-11 MED ORDER — DILTIAZEM HCL ER COATED BEADS 360 MG PO CP24
360.0000 mg | ORAL_CAPSULE | Freq: Every day | ORAL | Status: DC
  Administered 2012-09-11: 360 mg via ORAL
  Filled 2012-09-11 (×2): qty 1

## 2012-09-11 MED ORDER — OMEGA-3-ACID ETHYL ESTERS 1 G PO CAPS
1.0000 g | ORAL_CAPSULE | Freq: Two times a day (BID) | ORAL | Status: DC
  Filled 2012-09-11 (×2): qty 1

## 2012-09-11 MED ORDER — DILTIAZEM HCL 25 MG/5ML IV SOLN
10.0000 mg | Freq: Once | INTRAVENOUS | Status: AC
  Administered 2012-09-11: 10 mg via INTRAVENOUS

## 2012-09-11 MED ORDER — FLECAINIDE ACETATE 100 MG PO TABS
100.0000 mg | ORAL_TABLET | Freq: Two times a day (BID) | ORAL | Status: DC
  Filled 2012-09-11 (×2): qty 1

## 2012-09-11 MED ORDER — FLECAINIDE ACETATE 100 MG PO TABS
100.0000 mg | ORAL_TABLET | Freq: Two times a day (BID) | ORAL | Status: DC
  Administered 2012-09-11: 100 mg via ORAL
  Filled 2012-09-11 (×2): qty 1

## 2012-09-11 MED ORDER — FLECAINIDE ACETATE 100 MG PO TABS: 100.0000 mg | ORAL_TABLET | Freq: Two times a day (BID) | ORAL | Status: DC

## 2012-09-11 MED ORDER — ATORVASTATIN CALCIUM 20 MG PO TABS
20.0000 mg | ORAL_TABLET | Freq: Every day | ORAL | Status: DC
  Filled 2012-09-11: qty 1

## 2012-09-11 MED ORDER — SODIUM CHLORIDE 0.9 % IV SOLN
INTRAVENOUS | Status: AC
  Administered 2012-09-11: 06:00:00 via INTRAVENOUS

## 2012-09-11 MED ORDER — SODIUM CHLORIDE 0.9 % IV SOLN
Freq: Once | INTRAVENOUS | Status: AC
  Administered 2012-09-11: 02:00:00 via INTRAVENOUS

## 2012-09-11 MED ORDER — PANTOPRAZOLE SODIUM 40 MG PO TBEC
40.0000 mg | DELAYED_RELEASE_TABLET | Freq: Every day | ORAL | Status: DC
Start: 2012-09-11 — End: 2012-09-11
  Administered 2012-09-11: 40 mg via ORAL
  Filled 2012-09-11: qty 1

## 2012-09-11 MED ORDER — DILTIAZEM HCL ER COATED BEADS 360 MG PO CP24: 360.0000 mg | ORAL_CAPSULE | Freq: Every day | ORAL | Status: DC

## 2012-09-11 NOTE — Discharge Summary (Signed)
Physician Discharge Summary  Patient ID: Luis [name] MRN: [id number] DOB/AGE: 07-02-1945 67 y.o.  Admit date: 09/10/2012 Discharge date: 09/11/2012  Admission Diagnoses: Atrial Flutter w/ RVR  Discharge Diagnoses:  Active Problems:   Atrial flutter w/ RVR  Discharged Condition: stable  Hospital Course: [name] is a 67 y.o. male with a h/o paroxysmal atrial fibrillation, who underwent a radiofrequency catheter ablation, by Dr. Johney Frame, on 09/07/12. He presented back to the [facility] ER on 09/10/12 with complaints of palpitations, with associated lightheadedness. In the ER, he was found to be in atrial flutter with RVR with HR in the 140s. He denied any chest pain, SOB or syncope. He was placed on IV Diltiazem and his HR stabilized. Dr. Royann Shivers discussed the case with Dr. Johney Frame and it was decided to temporarily increase his Flecainide to 100 mg BID and his Diltiazem to 360 mg daily. He was instructed to continue taking Xarelto. The patient was last seen and examined by Dr. Royann Shivers, who determined he was stable for discharge home. His HR was controlled and he had no further symptoms. He will follow up at [facility] to ensure that he tolerates the increase in medications. He will also follow up in clinic with Dr. Johney Frame.   Consults: EP  Significant Diagnostic Studies: none  Treatments: See Hospital Course  Discharge Exam: Blood pressure 123/69, pulse 67, temperature 98.1 F (36.7 C), temperature source Oral, resp. rate 18, height 5\' 11"  (1.803 m), weight 170 lb (77.111 kg), SpO2 98.00%.   Disposition: 01-Home or Self Care      Discharge Orders   Future Appointments Provider Department Dept Phone   09/23/2012 8:30 AM [name], MD [facility] Main Office [place]) [phone]   Future Orders Complete By Expires     Diet - low sodium heart healthy  As directed     Increase activity slowly  As directed         Medication List    STOP taking these medications       diltiazem  240 MG 24 hr capsule  Commonly known as:  TIAZAC      TAKE these medications       Fish Oil 1000 MG Caps  Take 1 capsule by mouth at bedtime.     flecainide 100 MG tablet  Commonly known as:  TAMBOCOR  Take 1 tablet (100 mg total) by mouth every 12 (twelve) hours.     pantoprazole 40 MG tablet  Commonly known as:  PROTONIX  Take 40 mg by mouth daily with breakfast.     Rivaroxaban 20 MG Tabs  Commonly known as:  XARELTO  Take 1 tablet (20 mg total) by mouth daily.     rosuvastatin 10 MG tablet  Commonly known as:  CRESTOR  Take 10 mg by mouth every evening.       Follow-up Information   Follow up with Thurmon Fair, MD. (our office will call you to arrange an appointment)    Contact information:   [address] [place] [address] [phone]      TIME SPENT ON DISCHARGE, INCLUDING PHYSICIAN TIME: > 30 MINUTES  Signed: Allayne Butcher, PA-C 09/16/2012, 11:57 AM

## 2012-09-11 NOTE — H&P (Signed)
Please refer to Dr. Skipper Cliche note, with which I agree, as well as to the recent H&P notes from Dr. Rennis Golden and Allred.  Mr. Proto has converted back to sinus rhythm after several hours in atrial flutter with 2:1 atrial ventricular block with a heart rate of approximately 140 beats per minute. Currently in normal sinus rhythm with a rate of 60-65 beats per minutes while on diltiazem at 10 mg an hour intravenously   He tolerated the arrhythmia fairly well. He did not have any problems with presyncope, shortness of breath or chest pain during the spell of tachyarrhythmia. He did feel anxious and generally unwell and tired. He states he has had numerous episodes that have stopped themselves since his ablation procedure. He says he can usually tolerate them if they only last for an hour or so, but he becomes very apprehensive when the episodes are longer than that. She does state that the antiarrhythmic medication makes him feel "washed out" but has not had any serious side effects from it.  I discussed with him at length the fact that atrial tachyarrhythmias are very likely to recur within the next several weeks. He finds the persistent arrhythmia very frustrating. The frequency and prevalence should get gradually better, but it is highly likely he will have relapses within the first month after the ablation. We will temporarily increase his flecainide 200 mg twice a day but also increase the dose of diltiazem to reduce ventricular rates. Of note there was no evidence of atrial flutter with one-to-one conduction at any time while he was monitored.  We also review the critical importance of compliance with anticoagulation to avoid the risk of stroke.  If the flecainide and diltiazem still provide unsatisfactory symptom relief, there is an option to switch to amiodarone as temporary antiarrhythmic therapy.  These options were discussed today with Dr. [name] by phone. The patient is to followup with Dr.  Johney Frame scheduled for early September. We will see Mr. Pasch sooner in our clinic to make sure he is tolerating the current medication changes. He will be discharged home today if there is no arrhythmia recurrence within the next

## 2012-09-11 NOTE — H&P (Addendum)
Cardiology History and Physical  GREEN, Lorenda Ishihara, MD  History of Present Illness (and review of medical records): Luis [name] is a 67 y.o. male who presents for evaluation of palpitations.  He recently underwent atrial fibrillation ablation by Dr. Johney Frame on 6/3.  He states he had one episode of palpitations on Thursday evening associated with lightheadedness.  This was aborted by extra does of flecainide.  He then developed palpations again Friday night, however was persistent after flecainide.  He called after hours line and recommended patient come in to ed.  He was found to be in atrial flutter with RVR.  He denied any chest pain, shortness of breath, or syncope.    Review of Systems Further review of systems was otherwise negative other than stated in HPI.  Patient Active Problem List   Diagnosis Date Noted  . HYPERLIPIDEMIA 04/11/2010  . HYPERTENSION 04/11/2010  . Paroxysmal atrial fibrillation 04/11/2010  . HEMORRHOIDS 04/11/2010  . 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 DIVERTICULAR DISEASE 04/11/2010  . COLONIC POLYPS, HYPERPLASTIC, HX OF 04/11/2010  . RENAL CALCULUS, HX OF 04/11/2010  . ANAL FISSURE, HX OF 04/11/2010   Past Medical History  Diagnosis Date  . GERD (gastroesophageal reflux disease)   . History of kidney stones 07/2006    x 1  . Paroxysmal atrial fibrillation   . Hyperlipidemia   . CAD (coronary artery disease)     Normal LV function  . Atrial fibrillation 07/06/2012    Normal 2D Echo - EF-55-60  . Myocardial ischemia 07/06/2012    Normal exercise tolerance test  . Atrial fibrillation 08/27/2006    Seen by Dr. Marchia Bond, electrophisologist    Past Surgical History  Procedure Laterality Date  . Cardiac catheterization Left 07/20/2006    No significant CAD, EF 60%, continue medical therapy  . Tee without cardioversion N/A 09/06/2012    Procedure: TRANSESOPHAGEAL ECHOCARDIOGRAM (TEE);  Surgeon: Chrystie Nose, MD;  Location: [facility] ENDOSCOPY;   Service: Cardiovascular;  Laterality: N/A;  . Atrial ablation surgery      Prescriptions prior to admission  Medication Sig Dispense Refill  . diltiazem (TIAZAC) 240 MG 24 hr capsule Take 1 capsule (240 mg total) by mouth daily.  90 capsule  3  . flecainide (TAMBOCOR) 50 MG tablet Take 50 mg by mouth 2 (two) times daily.      . Omega-3 Fatty Acids (FISH OIL) 1000 MG CAPS Take 1 capsule by mouth at bedtime.      . pantoprazole (PROTONIX) 40 MG tablet Take 40 mg by mouth daily with breakfast.      . Rivaroxaban (XARELTO) 20 MG TABS Take 1 tablet (20 mg total) by mouth daily.  30 tablet  11  . rosuvastatin (CRESTOR) 10 MG tablet Take 10 mg by mouth every evening.       No Known Allergies  History  Substance Use Topics  . Smoking status: Former Smoker -- 35 years    Types: Cigarettes    Quit date: 11/27/1994  . Smokeless tobacco: Never Used  . Alcohol Use: 3.0 oz/week    6 drink(s) per week     Comment: 3-4 oz daily    Family History  Problem Relation Age of Onset  . CAD Father   . Heart Problems Father   . Stroke Father   . Hypertension Father   . Pancreatic cancer Mother   . Heart Problems Brother 33    Had stent put in     Objective:  Patient Vitals for the past 8 hrs:  BP Temp Temp src Pulse Resp SpO2 Height Weight  09/11/12 0526 - - - - - - 5\' 11"  (1.803 m) 77.111 kg (170 lb)  09/11/12 0524 126/75 mmHg 97.5 F (36.4 C) Oral 74 18 97 % - -  09/11/12 0430 107/69 mmHg - - 70 16 96 % - -  09/11/12 0415 117/65 mmHg - - 69 11 97 % - -  09/11/12 0340 123/61 mmHg - - 133 15 99 % - -  09/11/12 0243 112/80 mmHg - - 138 13 97 % - -  09/11/12 0223 133/88 mmHg - - 139 14 98 % - -  09/11/12 0130 130/91 mmHg - - 136 17 98 % - -   BP 126/75  Pulse 74  Temp(Src) 97.5 F (36.4 C) (Oral)  Resp 18  Ht 5\' 11"  (1.803 m)  Wt 77.111 kg (170 lb)  BMI 23.72 kg/m2  SpO2 97%  General Appearance:    Alert, cooperative, no distress, appears stated age  Head:    Normocephalic, without  obvious abnormality, atraumatic  Eyes:    PERRL, conjunctiva/corneas clear, EOM's intact, fundi    benign, both eyes       Ears:    Normal TM's and external ear canals, both ears  Nose:   Nares normal, septum midline, mucosa normal, no drainage    or sinus tenderness  Throat:   Lips, mucosa, and tongue normal; teeth and gums normal  Neck:   Supple, symmetrical, trachea midline, no adenopathy;       thyroid:  No enlargement/tenderness/nodules; no carotid   bruit or JVD  Back:     Symmetric, no curvature, ROM normal, no CVA tenderness  Lungs:     Clear to auscultation bilaterally, respirations unlabored  Chest wall:    No tenderness or deformity  Heart:    iregular rate and rhythm, S1 and S2 normal, no murmur, rub   or gallop  Abdomen:     Soft, non-tender, bowel sounds active all four quadrants,    no masses, no organomegaly  Genitalia:    Normal male without lesion, discharge or tenderness  Rectal:    Normal tone, normal prostate, no masses or tenderness;   guaiac negative stool  Extremities:   Extremities normal, atraumatic, no cyanosis or edema  Pulses:   2+ and symmetric all extremities  Skin:   Skin color, texture, turgor normal, no rashes or lesions  Lymph nodes:   Cervical, supraclavicular, and axillary nodes normal  Neurologic:   CNII-XII intact. Normal strength, sensation and reflexes      throughout    Results for orders placed during the hospital encounter of 09/10/12 (from the past 48 hour(s))  BASIC METABOLIC PANEL     Status: Abnormal   Collection Time    09/11/12 12:04 AM      Result Value Range   Sodium 139  135 - 145 mEq/L   Potassium 3.5  3.5 - 5.1 mEq/L   Chloride 103  96 - 112 mEq/L   CO2 28  19 - 32 mEq/L   Glucose, Bld 132 (*) 70 - 99 mg/dL   BUN 15  6 - 23 mg/dL   Creatinine, Ser 1.61  0.50 - 1.35 mg/dL   Calcium 09.6  8.4 - 04.5 mg/dL   GFR calc non Af Amer 63 (*) >90 mL/min   GFR calc Af Amer 73 (*) >90 mL/min   Comment:  The eGFR has been  calculated     using the CKD EPI equation.     This calculation has not been     validated in all clinical     situations.     eGFR's persistently     <90 mL/min signify     possible Chronic Kidney Disease.  CBC     Status: Abnormal   Collection Time    09/11/12 12:04 AM      Result Value Range   WBC 7.6  4.0 - 10.5 K/uL   RBC 5.24  4.22 - 5.81 MIL/uL   Hemoglobin 17.6 (*) 13.0 - 17.0 g/dL   HCT 16.1  09.6 - 04.5 %   MCV 90.5  78.0 - 100.0 fL   MCH 33.6  26.0 - 34.0 pg   MCHC 37.1 (*) 30.0 - 36.0 g/dL   Comment: RULED OUT INTERFERING SUBSTANCES   RDW 12.4  11.5 - 15.5 %   Platelets 147 (*) 150 - 400 K/uL  POCT I-STAT TROPONIN I     Status: None   Collection Time    09/11/12 12:07 AM      Result Value Range   Troponin i, poc 0.04  0.00 - 0.08 ng/mL   Comment 3            Comment: Due to the release kinetics of cTnI,     a negative result within the first hours     of the onset of symptoms does not rule out     myocardial infarction with certainty.     If myocardial infarction is still suspected,     repeat the test at appropriate intervals.   No results found.  ECG:  Rhythm appears to be atrial flutter with variable AV conduction. HR 141.  Assessment: 50M with recent ablation for atrial fibrillation presents with palpitations HR in 140s with no response to flecanide found to be in suspect atrial flutter with RVR.  Plan: 1. Cardiology  Admission  2. Continuous monitoring on Telemetry. 3. Repeat ekg on admit, prn chest pain or arrythmia 4. Attempt rate control with Diltiazem gtt. Continue flecainide. 5. Anticoagulation with Xarelto. 6. Keep NPO for DCCV if does not convert to sinus rhythm.         Agree, please see my separate note. Thurmon Fair, MD, [facility] [phone] office [phone] pager

## 2012-09-11 NOTE — ED Provider Notes (Signed)
History     CSN: [id number]  Arrival date & time 09/10/12  2335   First MD Initiated Contact with Patient 09/11/12 0003      Chief Complaint  Patient presents with  . Tachycardia    Patient is a 67 y.o. male presenting with palpitations. The history is provided by the patient.  Palpitations Palpitations quality:  Irregular Onset quality:  Sudden Duration: several hours ago. Timing:  Constant Progression:  Worsening Chronicity:  Recurrent Relieved by:  Nothing Worsened by:  Nothing tried Ineffective treatments: flecainide. Associated symptoms: dizziness   Associated symptoms: no back pain, no chest pain, no chest pressure, no shortness of breath, no syncope, no vomiting and no weakness     Past Medical History  Diagnosis Date  . GERD (gastroesophageal reflux disease)   . History of kidney stones 07/2006    x 1  . Paroxysmal atrial fibrillation   . Hyperlipidemia   . CAD (coronary artery disease)     Normal LV function  . Atrial fibrillation 07/06/2012    Normal 2D Echo - EF-55-60  . Myocardial ischemia 07/06/2012    Normal exercise tolerance test  . Atrial fibrillation 08/27/2006    Seen by Dr. Marchia Bond, electrophisologist    Past Surgical History  Procedure Laterality Date  . Cardiac catheterization Left 07/20/2006    No significant CAD, EF 60%, continue medical therapy  . Tee without cardioversion N/A 09/06/2012    Procedure: TRANSESOPHAGEAL ECHOCARDIOGRAM (TEE);  Surgeon: Chrystie Nose, MD;  Location: [facility] ENDOSCOPY;  Service: Cardiovascular;  Laterality: N/A;    Family History  Problem Relation Age of Onset  . CAD Father   . Heart Problems Father   . Stroke Father   . Hypertension Father   . Pancreatic cancer Mother   . Heart Problems Brother 55    Had stent put in    History  Substance Use Topics  . Smoking status: Former Smoker -- 35 years    Types: Cigarettes    Quit date: 11/27/1994  . Smokeless tobacco: Never Used  . Alcohol Use: 3.0 oz/week     6 drink(s) per week     Comment: 3-4 oz daily      Review of Systems  Constitutional: Negative for fever.  Respiratory: Negative for shortness of breath.   Cardiovascular: Positive for palpitations. Negative for chest pain and syncope.  Gastrointestinal: Negative for vomiting and diarrhea.  Musculoskeletal: Negative for back pain.  Neurological: Positive for dizziness. Negative for headaches.  Psychiatric/Behavioral: Negative for agitation.  All other systems reviewed and are negative.    Allergies  Review of patient's allergies indicates no known allergies.  Home Medications   Current Outpatient Rx  Name  Route  Sig  Dispense  Refill  . diltiazem (TIAZAC) 240 MG 24 hr capsule   Oral   Take 1 capsule (240 mg total) by mouth daily.   90 capsule   3   . flecainide (TAMBOCOR) 50 MG tablet   Oral   Take 1 tablet (50 mg total) by mouth 2 (two) times daily.   180 tablet   3   . Omega-3 Fatty Acids (FISH OIL) 1000 MG CAPS   Oral   Take 1 capsule by mouth at bedtime.         . pantoprazole (PROTONIX) 40 MG tablet   Oral   Take 1 tablet (40 mg total) by mouth daily with breakfast.   30 tablet   3   . Rivaroxaban (XARELTO) 20  MG TABS   Oral   Take 1 tablet (20 mg total) by mouth daily.   30 tablet   11   . rosuvastatin (CRESTOR) 10 MG tablet   Oral   Take 1 tablet (10 mg total) by mouth daily.   30 tablet   11     BP 136/88  Pulse 142  Temp(Src) 97.7 F (36.5 C) (Oral)  Resp 22  SpO2 99%  Physical Exam CONSTITUTIONAL: Well developed/well nourished HEAD: Normocephalic/atraumatic EYES: EOMI/PERRL ENMT: Mucous membranes moist NECK: supple no meningeal signs SPINE:entire spine nontender CV: tachycardic no murmurs/rubs/gallops noted LUNGS: Lungs are clear to auscultation bilaterally, no apparent distress ABDOMEN: soft, nontender, no rebound or guarding GU:no cva tenderness NEURO: Pt is awake/alert, moves all extremitiesx4 EXTREMITIES: pulses  normal, full ROM SKIN: warm, color normal PSYCH: no abnormalities of mood noted  ED Course  Procedures Labs Reviewed  BASIC METABOLIC PANEL  CBC  12:30 AM Pt with recent ablation for afib.  He had done well after this procedure (he takes xarelto) He did have some episodes of feeling back in afib recently and has been taking flecainide.  He reports tonight's episode is not responsive to flecainide.   He is well appearing Will give fluid bolus (?p waves on one of his EKGs) and will consult cardiology 12:55 AM I spoke to dr balfour with cardiology will see patient 1:48 AM Change in HR from fluids IV cardizem ordered Awaiting cardiology consultation  MDM  Nursing notes including past medical history and social history reviewed and considered in documentation Labs/vital reviewed and considered Previous records reviewed and considered        Date: 09/10/2012 2345  Rate: 141  Rhythm: atrial flutter  QRS Axis: left  Intervals: normal  ST/T Wave abnormalities: nonspecific ST changes  Conduction Disutrbances:none  Narrative Interpretation:   Old EKG Reviewed: unchanged   Date: 09/11/2012 0009  Rate: 142  Rhythm: indeterminate  QRS Axis: left  Intervals: normal  ST/T Wave abnormalities: nonspecific ST changes  Conduction Disutrbances:none     Joya Gaskins, MD 09/11/12 0148

## 2012-09-11 NOTE — ED Notes (Signed)
Admitting at bedside 

## 2012-09-11 NOTE — ED Notes (Signed)
Pt had ablation for a-fic. Pt on blood thinners. Pt HR 140, confirmed by palp at carotid and radial. Pt denies chest pain at this time. Pt denies n/v. Pt denies SOB, pt states he is just light headed. Pt placed on Zoll

## 2012-09-13 ENCOUNTER — Ambulatory Visit (INDEPENDENT_AMBULATORY_CARE_PROVIDER_SITE_OTHER): Payer: Medicare Other | Admitting: Cardiology

## 2012-09-13 ENCOUNTER — Encounter: Payer: Self-pay | Admitting: Cardiology

## 2012-09-13 ENCOUNTER — Telehealth: Payer: Self-pay | Admitting: Cardiovascular Disease

## 2012-09-13 VITALS — BP 112/70 | HR 134 | Ht 71.0 in | Wt 172.0 lb

## 2012-09-13 DIAGNOSIS — I10 Essential (primary) hypertension: Secondary | ICD-10-CM

## 2012-09-13 DIAGNOSIS — Z0389 Encounter for observation for other suspected diseases and conditions ruled out: Secondary | ICD-10-CM

## 2012-09-13 DIAGNOSIS — I4891 Unspecified atrial fibrillation: Secondary | ICD-10-CM

## 2012-09-13 DIAGNOSIS — I480 Paroxysmal atrial fibrillation: Secondary | ICD-10-CM

## 2012-09-13 DIAGNOSIS — IMO0001 Reserved for inherently not codable concepts without codable children: Secondary | ICD-10-CM

## 2012-09-13 DIAGNOSIS — I2510 Atherosclerotic heart disease of native coronary artery without angina pectoris: Secondary | ICD-10-CM | POA: Insufficient documentation

## 2012-09-13 MED ORDER — METOPROLOL TARTRATE 25 MG PO TABS: 25.0000 mg | ORAL_TABLET | Freq: Three times a day (TID) | ORAL | Status: DC

## 2012-09-13 MED ORDER — DILTIAZEM HCL ER COATED BEADS 240 MG PO CP24: ORAL_CAPSULE | ORAL | Status: DC

## 2012-09-13 NOTE — Assessment & Plan Note (Signed)
Controlled.  

## 2012-09-13 NOTE — Progress Notes (Signed)
09/13/2012 [name]   March 04, 1946  [id number]  Primary Physicia GREEN, Lorenda Ishihara, MD Primary Cardiologist: Dr Allyson Sabal  HPI:  The patient is a very pleasant 67 year old, thin and fit-appearing, married Caucasian male, father of 4, grandfather to 2 grandchildren who has a history of PAF back in 2008. He was evaluated by Dr. [name] at [facility] back in May 2008. He was cath'd by Dr. [name] the month before revealing noncritical CAD with normal LV function. He has had 2 episodes of symptomatic A-fib this year, the last of which occurred on June 19, 2012, which resulted in presyncope. He noticed this while he was playing golf. When he presented to the ER his heart rate was 144. He was placed on IV diltiazem and ultimately converted to sinus rhythm and was discharged home on flecainide 50 mg p.o. b.i.d. Because his CHADS2 score was it was elected to keep him on aspirin.              He recently had recurrent PAF and had a RFA by Dr Tillie Rung 09/07/12. The pt had recurrent PAF over the weekend and spent the night at [facility] 09/11/12. He converted to NSR after a few hours of IV Diltiazem.  His Flecainide was increased to 100mg  BID and his Diltiazem was increased to 360mg  daily. He was started on Xarelto. He had been on Toprol 100mg  daily in the past but reportedly had some vague side effects ("palpitations and fatigue"). He presents to the office today with tachycardia that started 09/12/12 pm. His HR is 134 in the office today. His EKG appears to show a flutter with incomplete RBBB.         His other problems include history of hypertension, mild hyperlipidemia and family history of heart disease with a father who had an MI at age 17 and a brother who had stent placed at age 88.    Current Outpatient Prescriptions  Medication Sig Dispense Refill  . diltiazem (CARDIZEM CD) 240 MG 24 hr capsule 240mg  po daily  30 capsule  5  . flecainide (TAMBOCOR) 100 MG tablet Take 1 tablet (100 mg total) by mouth every 12 (twelve)  hours.  60 tablet  5  . Omega-3 Fatty Acids (FISH OIL) 1000 MG CAPS Take 1 capsule by mouth at bedtime.      . pantoprazole (PROTONIX) 40 MG tablet Take 40 mg by mouth daily with breakfast.      . Rivaroxaban (XARELTO) 20 MG TABS Take 1 tablet (20 mg total) by mouth daily.  30 tablet  11  . rosuvastatin (CRESTOR) 10 MG tablet Take 10 mg by mouth every evening.      . metoprolol tartrate (LOPRESSOR) 25 MG tablet Take 1 tablet (25 mg total) by mouth every 8 (eight) hours.  30 tablet  0   No current facility-administered medications for this visit.    No Known Allergies  History   Social History  . Marital Status: Married    Spouse Name: N/A    Number of Children: N/A  . Years of Education: N/A   Occupational History  . Not on file.   Social History Main Topics  . Smoking status: Former Smoker -- 35 years    Types: Cigarettes    Quit date: 11/27/1994  . Smokeless tobacco: Never Used  . Alcohol Use: 3.0 oz/week    6 drink(s) per week     Comment: 3-4 oz daily  . Drug Use: No  . Sexually Active: Not on  file   Other Topics Concern  . Not on file   Social History Narrative   Lives in [place] with spouse.   Retired Visual merchandiser     Review of Systems: General: negative for chills, fever, night sweats or weight changes.  Cardiovascular: negative for chest pain, dyspnea on exertion, edema, orthopnea, palpitations, paroxysmal nocturnal dyspnea or shortness of breath Dermatological: negative for rash Respiratory: negative for cough or wheezing Urologic: negative for hematuria Abdominal: negative for nausea, vomiting, diarrhea, bright red blood per rectum, melena, or hematemesis Neurologic: negative for visual changes, syncope, or dizziness All other systems reviewed and are otherwise negative except as noted above.    Blood pressure 112/70, pulse 134, height 5\' 11"  (1.803 m), weight 172 lb (78.019 kg).  General appearance: alert, cooperative and no  distress Lungs: clear to auscultation bilaterally Heart: irregularly irregular rhythm  EKG A flutter with VR 134 and incomplete RBBB  ASSESSMENT AND PLAN:   Paroxysmal atrial fibrillation Recurrent, RFA 09/07/12, ER- Flecainide and Diltiazem increased 09/11/12- now back in AF with RVR  HYPERTENSION Controlled   Normal coronary arteries Cath 2008   PLAN  I started Mr Cone on Metoprolol 25mg  Q8hrs and asked him to decrease his Diltiazem to 240mg  daily. He will return to the clinic tomorrow PM for re evaluation. We may need to consider Amiodarone. He has a long history of smoking but he quit in 1996 and there was no COPD noted on his last CXR.   Martena Emanuele KPA-C 09/13/2012 10:25 AM

## 2012-09-13 NOTE — Patient Instructions (Signed)
Decrease Diltiazem to 240mg  daily starting 6/10. Start Metoprolol 25mg  every 8 hours this afternoon. Return to clinic tomorrow afternoon.

## 2012-09-13 NOTE — Telephone Encounter (Signed)
Returned call.  Pt stated he had an ablation last week and has been in and out of the hospital since.  Pt stated he HR has been irregular and he can't get it back.  Stated he called last night and spoke w/ a doctor and was told to call first thing this morning to be seen.  Appt scheduled for today at 9:40am w/ L. Kilroy, PA-C.  Pt agreed w/ plan.

## 2012-09-13 NOTE — Assessment & Plan Note (Signed)
Cath 2008

## 2012-09-13 NOTE — Assessment & Plan Note (Signed)
Recurrent, RFA 09/07/12, ER- Flecainide and Diltiazem increased 09/11/12- now back in AF with RVR

## 2012-09-13 NOTE — Telephone Encounter (Signed)
Talked to Dr Tresa Endo last night -heaart been out of rhthym for 12 hrs-Dr Tresa Endo told him to call first thing this morning-probably need to be seen!

## 2012-09-14 ENCOUNTER — Encounter: Payer: Self-pay | Admitting: Cardiology

## 2012-09-14 ENCOUNTER — Ambulatory Visit (INDEPENDENT_AMBULATORY_CARE_PROVIDER_SITE_OTHER): Payer: Medicare Other | Admitting: Cardiology

## 2012-09-14 ENCOUNTER — Encounter: Payer: Self-pay | Admitting: Cardiovascular Disease

## 2012-09-14 VITALS — BP 116/70 | HR 59 | Ht 71.5 in | Wt 173.0 lb

## 2012-09-14 DIAGNOSIS — I4891 Unspecified atrial fibrillation: Secondary | ICD-10-CM

## 2012-09-14 DIAGNOSIS — R319 Hematuria, unspecified: Secondary | ICD-10-CM | POA: Insufficient documentation

## 2012-09-14 MED ORDER — METOPROLOL TARTRATE 25 MG PO TABS: 25.0000 mg | ORAL_TABLET | Freq: Two times a day (BID) | ORAL | Status: DC

## 2012-09-14 NOTE — Progress Notes (Signed)
09/14/2012 [name]   [birth year]  [id number]  Primary Physicia GREEN, Lorenda Ishihara, MD Primary Cardiologist: Dr Allyson Sabal  HPI:  The patient is a 67-y/o who has a history of PAF dating back to 2008. He was evaluated then by Dr. [name] at [facility] in May 2008. He was cath'd by Dr. [name] the month prior revealing noncritical CAD with normal LV function. He has had 2 episodes of symptomatic A-fib this year, the last of which occurred on June 19, 2012, which resulted in presyncope. He noticed this while he was playing golf. When he presented to the ER then his heart rate was 144. He was placed on IV diltiazem and ultimately converted to sinus rhythm and was discharged home on flecainide 50 mg p.o. b.i.d. Because his CHADS2 score was it was elected to keep him on aspirin alone then.            He recently had recurrent PAF and had a RFA by Dr Tillie Rung 09/07/12. The pt again had recurrent PAF over the weekend and spent the night at [facility] 09/11/12. He converted to NSR after a few hours of IV Diltiazem. His Flecainide was increased to 100mg  BID and his Diltiazem was increased to 360mg  daily. He was started on Xarelto. He had been on Toprol 100mg  daily in the past but reportedly had some vague side effects ("palpitations and fatigue"). He presented to the office 09/13/12 with tachycardia that started 09/12/12 pm. His HR  Was 134. His EKG showed a flutter with incomplete RBBB.  I started him on Metoprolol 25mg  Q8hrs and cut his Diltiazem back to 240mg  daily. He is in the office today for follow up. Overnight he converted back to NSR and he feels well. His EKG confirms NSR. His Qtc is 437. He does mention that he has noted some rust colored urine. He denies dysuria. He has had prior kidney stones.    Current Outpatient Prescriptions  Medication Sig Dispense Refill  . diltiazem (CARDIZEM CD) 240 MG 24 hr capsule 240mg  po daily  30 capsule  5  . flecainide (TAMBOCOR) 100 MG tablet Take 1 tablet (100 mg total) by mouth  every 12 (twelve) hours.  60 tablet  5  . metoprolol tartrate (LOPRESSOR) 25 MG tablet Take 1 tablet (25 mg total) by mouth 2 (two) times daily.  30 tablet  0  . Omega-3 Fatty Acids (FISH OIL) 1000 MG CAPS Take 1 capsule by mouth at bedtime.      . pantoprazole (PROTONIX) 40 MG tablet Take 40 mg by mouth daily with breakfast.      . Rivaroxaban (XARELTO) 20 MG TABS Take 1 tablet (20 mg total) by mouth daily.  30 tablet  11  . rosuvastatin (CRESTOR) 10 MG tablet Take 10 mg by mouth every evening.       No current facility-administered medications for this visit.    No Known Allergies  History   Social History  . Marital Status: Married    Spouse Name: N/A    Number of Children: N/A  . Years of Education: N/A   Occupational History  . Not on file.   Social History Main Topics  . Smoking status: Former Smoker -- 35 years    Types: Cigarettes    Quit date: 11/27/1994  . Smokeless tobacco: Never Used  . Alcohol Use: 3.0 oz/week    6 drink(s) per week     Comment: 3-4 oz daily  . Drug Use: No  . Sexually Active:  Not on file   Other Topics Concern  . Not on file   Social History Narrative   Lives in [place] with spouse.   Retired Visual merchandiser     Review of Systems: General: negative for chills, fever, night sweats or weight changes.  Cardiovascular: negative for chest pain, dyspnea on exertion, edema, orthopnea, palpitations, paroxysmal nocturnal dyspnea or shortness of breath Dermatological: negative for rash Respiratory: negative for cough or wheezing Urologic: positive for tea colored urine. History of kidney stones. No dysuria. Abdominal: negative for nausea, vomiting, diarrhea, bright red blood per rectum, melena, or hematemesis Neurologic: negative for visual changes, syncope, or dizziness All other systems reviewed and are otherwise negative except as noted above.    Blood pressure 116/70, pulse 59, height 5' 11.5" (1.816 m), weight 173 lb (78.472  kg).  General appearance: alert, cooperative and no distress Lungs: clear to auscultation bilaterally Heart: regular rate and rhythm  EKG  EKG: normal EKG, normal sinus rhythm.   ASSESSMENT AND PLAN:   Paroxysmal atrial fibrillation  Recurrent, RFA 09/07/12, ER- Flecainide and Diltiazem increased 09/11/12- back in AF with RVR 6/8. Now in NSR after addition of Metoprolol HYPERTENSION  Controlled  Normal coronary arteries  Cath 2008    PLAN  Decrease Metoprolol to 25mg  BID. See Dr Allyson Sabal in 3 months. Check UA today- possible hematuria on Xarelto.   [facility] KPA-C 09/14/2012 3:39 PM

## 2012-09-14 NOTE — Patient Instructions (Signed)
Decrease Metoprolol to 25mg  BID. UA today. Dr Allyson Sabal in 3 months

## 2012-09-15 LAB — URINALYSIS
Glucose, UA: NEGATIVE mg/dL
Hgb urine dipstick: NEGATIVE
Ketones, ur: NEGATIVE mg/dL
Nitrite: NEGATIVE
Protein, ur: NEGATIVE mg/dL
Specific Gravity, Urine: 1.027 (ref 1.005–1.030)
Urobilinogen, UA: 1 mg/dL (ref 0.0–1.0)
pH: 5 (ref 5.0–8.0)

## 2012-09-16 ENCOUNTER — Telehealth: Payer: Self-pay | Admitting: Cardiovascular Disease

## 2012-09-16 ENCOUNTER — Ambulatory Visit: Payer: Medicare Other | Admitting: Cardiology

## 2012-09-16 ENCOUNTER — Telehealth: Payer: Self-pay | Admitting: *Deleted

## 2012-09-16 DIAGNOSIS — I4892 Unspecified atrial flutter: Secondary | ICD-10-CM

## 2012-09-16 DIAGNOSIS — R82998 Other abnormal findings in urine: Secondary | ICD-10-CM

## 2012-09-16 NOTE — Telephone Encounter (Signed)
Saw Luke on Tuesday and Samara Deist was going to call in a prescription for his Metoprolol- It still not at the pharmacy-said they didn't know anything about it! Please call this in to [facility] on [address]!  t

## 2012-09-16 NOTE — Telephone Encounter (Signed)
Urine culture order sent to lab per L.Diona Fanti, PA-C.

## 2012-09-17 MED ORDER — METOPROLOL TARTRATE 25 MG PO TABS: 25.0000 mg | ORAL_TABLET | Freq: Two times a day (BID) | ORAL | Status: DC

## 2012-09-18 LAB — URINE CULTURE
Colony Count: NO GROWTH
Organism ID, Bacteria: NO GROWTH

## 2012-09-21 ENCOUNTER — Telehealth (HOSPITAL_COMMUNITY): Payer: Self-pay | Admitting: Cardiology

## 2012-09-21 NOTE — Telephone Encounter (Signed)
Returned call.  Left message to call back before 4pm.  

## 2012-09-21 NOTE — Telephone Encounter (Signed)
See result note.  

## 2012-09-21 NOTE — Telephone Encounter (Signed)
Pt called wanting to know if someone could call him back about his test results.

## 2012-09-23 ENCOUNTER — Encounter: Payer: Self-pay | Admitting: Internal Medicine

## 2012-09-23 ENCOUNTER — Ambulatory Visit (INDEPENDENT_AMBULATORY_CARE_PROVIDER_SITE_OTHER): Payer: Medicare Other | Admitting: Internal Medicine

## 2012-09-23 VITALS — BP 128/62 | HR 57 | Ht 71.0 in | Wt 173.0 lb

## 2012-09-23 DIAGNOSIS — I4891 Unspecified atrial fibrillation: Secondary | ICD-10-CM

## 2012-09-23 DIAGNOSIS — I480 Paroxysmal atrial fibrillation: Secondary | ICD-10-CM

## 2012-09-23 NOTE — Progress Notes (Signed)
PCP: Enrique Sack, MD Primary Cardiologist:  Dr Royann Shivers  Luis [name] is a 67 y.o. male who presents today for electrophysiology followup.  Since his recent afib ablation, the patient reports doing very well.  He has had some ERAF post ablation.  This has improved recently.  His dizziness is stable.  He denies procedure related problems.  Today, he denies symptoms of pchest pain, shortness of breath,  lower extremity edema,  presyncope, or syncope.  The patient is otherwise without complaint today.   Past Medical History  Diagnosis Date  . GERD (gastroesophageal reflux disease)   . History of kidney stones 07/2006    x 1  . Paroxysmal atrial fibrillation     2008 and 16109, RFA 09/07/12  . Hyperlipidemia   . Normal coronary arteries 2008    Normal LV function   Past Surgical History  Procedure Laterality Date  . Cardiac catheterization Left 07/20/2006    No significant CAD, EF 60%, continue medical therapy  . Tee without cardioversion N/A 09/06/2012    Procedure: TRANSESOPHAGEAL ECHOCARDIOGRAM (TEE);  Surgeon: Chrystie Nose, MD;  Location: [facility] ENDOSCOPY;  Service: Cardiovascular;  Laterality: N/A;  . Atrial ablation surgery  09/07/12    Dr Tillie Rung    Current Outpatient Prescriptions  Medication Sig Dispense Refill  . diltiazem (CARDIZEM CD) 240 MG 24 hr capsule 240mg  po daily  30 capsule  5  . flecainide (TAMBOCOR) 100 MG tablet Take 1 tablet (100 mg total) by mouth every 12 (twelve) hours.  60 tablet  5  . metoprolol tartrate (LOPRESSOR) 25 MG tablet Take 1 tablet (25 mg total) by mouth 2 (two) times daily.  60 tablet  6  . Omega-3 Fatty Acids (FISH OIL) 1000 MG CAPS Take 1 capsule by mouth at bedtime.      . pantoprazole (PROTONIX) 40 MG tablet Take 40 mg by mouth daily with breakfast.      . Rivaroxaban (XARELTO) 20 MG TABS Take 1 tablet (20 mg total) by mouth daily.  30 tablet  11  . rosuvastatin (CRESTOR) 10 MG tablet Take 10 mg by mouth every evening.       No current  facility-administered medications for this visit.    Physical Exam: Filed Vitals:   09/23/12 0829  BP: 128/62  Pulse: 57  Height: 5\' 11"  (1.803 m)  Weight: 173 lb (78.472 kg)    GEN- The patient is well appearing, alert and oriented x 3 today.   Head- normocephalic, atraumatic Eyes-  Sclera clear, conjunctiva pink Ears- hearing intact Oropharynx- clear Lungs- Clear to ausculation bilaterally, normal work of breathing Heart- Regular rate and rhythm, no murmurs, rubs or gallops, PMI not laterally displaced GI- soft, NT, ND, + BS Extremities- no clubbing, cyanosis, or edema  ekg today reveals sinus bradycardia 57 bpm, incomplete RBBB  Assessment and Plan:  1. afib Doing well s/p ablation with expected ERAF Continue current medicines If no further afib in 4 weeks, decrease flecainide to 50mg  BID Return to see me in 10 weeks

## 2012-09-23 NOTE — Patient Instructions (Signed)
If you haven't had any more atrial fibrillation in 4 weeks, okay to decrease Flecainide to 50mg  twice daily.  Follow up with Dr Johney Frame 12-09-2012 at [facility].

## 2012-10-01 ENCOUNTER — Telehealth: Payer: Self-pay | Admitting: Internal Medicine

## 2012-10-01 NOTE — Telephone Encounter (Signed)
New Problem  Pt wants to speak with someone regarding the medication that he is taking.

## 2012-10-01 NOTE — Telephone Encounter (Signed)
Spoke with patient who c/o fatigue at around 7:00 pm daily.  Patient states that he is taking Flecainide 100 mg twice daily and that he is not experiencing atrial fib which is grateful for but is wondering if the Flecainide is causing the fatigue.  Patient states his heart rate is in the high 40's to low 50's and he is also wondering if this is causing him to feel lethargic.  Patient states Dr. Johney Frame made him aware at his last appointment on 6/19 that he would lower his dose in about 4 weeks. Patient states he may be dehydrated.  I discussed with the patient the importance of good hydration and consuming at least 64 oz of water daily.  Patient states that he is not currently doing this and wants to know if consuming 1 alcoholic beverage daily was contraindicated.  I advised patient that alcohol is not listed as a contraindication but that it can further contribute to dehydration.  Patient verbalized understanding and states he will begin to measure his fluid intake and will strive to consume at least 64 oz water daily.  I advised patient that I will send message to Dr. Johney Frame and his nurse, Dennis Bast RN to make them aware.  Patient verbalized understanding and thanked me for my help.  I advised patient to call back if he experiences worsening s/s.

## 2012-10-03 NOTE — Telephone Encounter (Signed)
Adequate hydration is important.  Would avoid ETOH with afib.  1 beverage may be ok. He should take flecainide as instructed with anticipated decrease in dose as we discussed.

## 2012-10-13 NOTE — Telephone Encounter (Signed)
Called patient to evaluate fatigue. Patient states he is still experiencing some fatigue but has not had any episodes of afib.  Patient states he has increased his fluid intake and feels like he is doing a better job of staying hydrated.  Patient states he is not consuming more than one alcohol drink per day; he states most of the time he isn't even consuming all of one drink.  Patient states he has a lot of energy in the morning but that he has no energy in the evening. Patient states his heart rate is 48-50 bpm every evening.  I advised patient of Dr. Jenel Lucks advice and also to keep a log of BP and heart rate so that we can evaluate whether these are consistently running too low. Patient verbalized understanding.

## 2012-10-15 ENCOUNTER — Encounter (HOSPITAL_COMMUNITY): Payer: Self-pay | Admitting: *Deleted

## 2012-10-18 ENCOUNTER — Telehealth: Payer: Self-pay | Admitting: Internal Medicine

## 2012-10-18 NOTE — Telephone Encounter (Signed)
LMTCB

## 2012-10-18 NOTE — Telephone Encounter (Signed)
New problem  Pt wants to speak with you. He said that you all talked one day last week and he would just rather talk with you again.

## 2012-10-19 NOTE — Telephone Encounter (Signed)
Follow up  Pt is returning a call to speak with a nurse.

## 2012-10-19 NOTE — Telephone Encounter (Signed)
7/9-- 122/55 HR 54, pm 110/55 HR 49, 7/10 am 118/60 HR 51 pm 125/55 HR 50,  7/11 am 120/58 HR 52 pm 122/55 HR 52 He feels sluggish, he had his PE on Thurs and all his labs were great  TC 125 LDL/HDL were good.  Wants to know if he can back down on anything else to help with his sluggish feeling

## 2012-10-21 NOTE — Telephone Encounter (Signed)
Follow Up     Pt calling following up on a call from earlier this week with nurse. Please call.

## 2012-10-21 NOTE — Telephone Encounter (Signed)
Called patient back and let him know I am waiting on response from Dr Johney Frame

## 2012-11-05 NOTE — Telephone Encounter (Signed)
Discussed with Dr Johney Frame, he can decrease his Metoprolol to 12.5mg  bid for one week then stop.  I called the patient and let him know Dr Jenel Lucks recommendations and he is going to make the change on Mon due to being out of town for the weekend

## 2012-11-10 ENCOUNTER — Other Ambulatory Visit: Payer: Self-pay

## 2012-11-16 ENCOUNTER — Telehealth: Payer: Self-pay | Admitting: Internal Medicine

## 2012-11-16 NOTE — Telephone Encounter (Signed)
New Prob  Pt would like to speak with a nurse regarding his medication, he would not give any more details.

## 2012-11-16 NOTE — Telephone Encounter (Signed)
Patient stated having problems with allergies and wanted to know what he could take. Advised ok to take Claritin/Zyrtec plain but to avoid anything with decongestant. Verbalized understanding.   Patient still wanted to speak with Kell L RN tomorrow regarding something else. Will forward to her.

## 2012-11-17 NOTE — Telephone Encounter (Addendum)
Spoke with patient today and he is feeling much better since decreasing the Metoprolol to 12.5mg  bid then he stopped and his energy level improved but his HR is averaging 90.  Typically up in the afternoon.  At rest his HR is 72

## 2012-11-18 ENCOUNTER — Other Ambulatory Visit: Payer: Self-pay | Admitting: *Deleted

## 2012-11-18 MED ORDER — FLECAINIDE ACETATE 100 MG PO TABS: 50.0000 mg | ORAL_TABLET | Freq: Two times a day (BID) | ORAL | Status: DC

## 2012-11-18 NOTE — Telephone Encounter (Signed)
Discussed with Dr Johney Frame he is going to hold off on restarting the Metoprolol 12.5mg  twice daily and see how the next few days go.  I let him know Dr Johney Frame is okay with his HR's.  If he feels he needs to restart the Metoprolol 12.54mg  bid he is going to call me and let me know.

## 2012-11-30 ENCOUNTER — Telehealth: Payer: Self-pay | Admitting: Internal Medicine

## 2012-11-30 MED ORDER — FLECAINIDE ACETATE 50 MG PO TABS: 50.0000 mg | ORAL_TABLET | Freq: Two times a day (BID) | ORAL | Status: DC

## 2012-11-30 NOTE — Telephone Encounter (Signed)
New prob  Pt would like to speak with a nurse regarding getting his flecainide changed to a different pill.

## 2012-11-30 NOTE — Telephone Encounter (Signed)
Patient requesting that Rx for Flecainide 50mg  by mouth twice daily be ordered to replace the Flecainide 100mg  tablet that he has to cut in half each day. Completed and called into pharmacy.

## 2012-12-08 ENCOUNTER — Encounter: Payer: Medicare Other | Admitting: Internal Medicine

## 2012-12-09 ENCOUNTER — Ambulatory Visit (INDEPENDENT_AMBULATORY_CARE_PROVIDER_SITE_OTHER): Payer: Medicare Other | Admitting: Internal Medicine

## 2012-12-09 ENCOUNTER — Encounter: Payer: Self-pay | Admitting: Internal Medicine

## 2012-12-09 VITALS — BP 138/72 | HR 78 | Ht 71.0 in | Wt 177.0 lb

## 2012-12-09 DIAGNOSIS — I4891 Unspecified atrial fibrillation: Secondary | ICD-10-CM

## 2012-12-09 DIAGNOSIS — I480 Paroxysmal atrial fibrillation: Secondary | ICD-10-CM

## 2012-12-09 NOTE — Progress Notes (Signed)
PCP: Enrique Sack, MD Primary Cardiologist:  Dr Dennison Luis [name] is a 67 y.o. male who presents today for electrophysiology followup.  Since his recent afib ablation, the patient reports doing very well.  He has had no afib since June.   He is pleased with his current health state. Today, he denies symptoms of chest pain, shortness of breath,  lower extremity edema,  presyncope, or syncope.  The patient is otherwise without complaint today.   Past Medical History  Diagnosis Date  . GERD (gastroesophageal reflux disease)   . History of kidney stones 07/2006    x 1  . Paroxysmal atrial fibrillation     2008 and 16109, PVI 09/07/12  . Hyperlipidemia   . Normal coronary arteries 2008    Normal LV function   Past Surgical History  Procedure Laterality Date  . Cardiac catheterization Left 07/20/2006    No significant CAD, EF 60%, continue medical therapy  . Tee without cardioversion N/A 09/06/2012    Procedure: TRANSESOPHAGEAL ECHOCARDIOGRAM (TEE);  Surgeon: Chrystie Nose, MD;  Location: [facility] ENDOSCOPY;  Service: Cardiovascular;  Laterality: N/A;  . Atrial ablation surgery  09/07/12    PVI by Dr Johney Frame    Current Outpatient Prescriptions  Medication Sig Dispense Refill  . diltiazem (CARDIZEM CD) 240 MG 24 hr capsule 240mg  po daily  30 capsule  5  . flecainide (TAMBOCOR) 50 MG tablet Take 1 tablet (50 mg total) by mouth 2 (two) times daily.  180 tablet  3  . Omega-3 Fatty Acids (FISH OIL) 1000 MG CAPS Take 1 capsule by mouth at bedtime.      [name] omeprazole (PRILOSEC) 40 MG capsule Take 1 capsule by mouth daily.      . Rivaroxaban (XARELTO) 20 MG TABS Take 1 tablet (20 mg total) by mouth daily.  30 tablet  11  . rosuvastatin (CRESTOR) 10 MG tablet Take 10 mg by mouth every evening.       No current facility-administered medications for this visit.    Physical Exam: Filed Vitals:   12/09/12 1507  BP: 138/72  Pulse: 78  Height: 5\' 11"  (1.803 m)  Weight: 177 lb (80.287 kg)     GEN- The patient is well appearing, alert and oriented x 3 today.   Head- normocephalic, atraumatic Eyes-  Sclera clear, conjunctiva pink Ears- hearing intact Oropharynx- clear Lungs- Clear to ausculation bilaterally, normal work of breathing Heart- Regular rate and rhythm, no murmurs, rubs or gallops, PMI not laterally displaced GI- soft, NT, ND, + BS Extremities- no clubbing, cyanosis, or edema  ekg today reveals sinus bradycardia 78 bpm, incomplete RBBB  Assessment and Plan:  1. afib Doing well s/p ablation,  Fatigue is much better off of beta blockers Continue current medicines If no further afib upon return we may consider stopping flecainide Return to see me in 12 weeks

## 2012-12-09 NOTE — Patient Instructions (Addendum)
Your physician recommends that you schedule a follow-up appointment in 3 months with Dr Allred    

## 2012-12-28 ENCOUNTER — Ambulatory Visit: Payer: Medicare Other | Admitting: Cardiology

## 2012-12-29 ENCOUNTER — Ambulatory Visit (INDEPENDENT_AMBULATORY_CARE_PROVIDER_SITE_OTHER): Payer: Medicare Other | Admitting: Cardiology

## 2012-12-29 ENCOUNTER — Encounter: Payer: Self-pay | Admitting: Cardiology

## 2012-12-29 ENCOUNTER — Ambulatory Visit: Payer: Medicare Other | Admitting: Cardiology

## 2012-12-29 VITALS — BP 138/80 | HR 76 | Ht 71.0 in | Wt 179.4 lb

## 2012-12-29 DIAGNOSIS — I4891 Unspecified atrial fibrillation: Secondary | ICD-10-CM

## 2012-12-29 DIAGNOSIS — I10 Essential (primary) hypertension: Secondary | ICD-10-CM

## 2012-12-29 DIAGNOSIS — E785 Hyperlipidemia, unspecified: Secondary | ICD-10-CM

## 2012-12-29 DIAGNOSIS — I480 Paroxysmal atrial fibrillation: Secondary | ICD-10-CM

## 2012-12-29 MED ORDER — RIVAROXABAN 20 MG PO TABS: 20.0000 mg | ORAL_TABLET | Freq: Every day | ORAL | Status: DC

## 2012-12-29 NOTE — Assessment & Plan Note (Signed)
Controled

## 2012-12-29 NOTE — Patient Instructions (Addendum)
Your physician recommends that you schedule a follow-up appointment in:6 months with Dr Berry 

## 2012-12-29 NOTE — Assessment & Plan Note (Signed)
Holding NSR. He saw Dr Johney Frame earlier this month and his medications were adjusted down.[name]

## 2012-12-29 NOTE — Progress Notes (Signed)
12/29/2012 Luis [name]   05-Aug-1945  [id number]  Primary Physicia GREEN, Lorenda Ishihara, MD Primary Cardiologist: Dr Johney Frame  HPI:  67 y/o followed by Dr Allyson Sabal and Dr Johney Frame with a history of PAF. He has had RFA in the past. He had breakthrough PAF this summer and we adjusted his medication    Current Outpatient Prescriptions  Medication Sig Dispense Refill  . diltiazem (CARDIZEM CD) 240 MG 24 hr capsule 240mg  po daily  30 capsule  5  . flecainide (TAMBOCOR) 50 MG tablet Take 1 tablet (50 mg total) by mouth 2 (two) times daily.  180 tablet  3  . Rivaroxaban (XARELTO) 20 MG TABS Take 1 tablet (20 mg total) by mouth daily.  30 tablet  11  . rosuvastatin (CRESTOR) 10 MG tablet Take 10 mg by mouth every evening.       No current facility-administered medications for this visit.    Allergies  Allergen Reactions  . Lopressor [Metoprolol]     Fatigue    History   Social History  . Marital Status: Married    Spouse Name: N/A    Number of Children: N/A  . Years of Education: N/A   Occupational History  . Not on file.   Social History Main Topics  . Smoking status: Former Smoker -- 35 years    Types: Cigarettes    Quit date: 11/27/1994  . Smokeless tobacco: Never Used  . Alcohol Use: 3.0 oz/week    6 drink(s) per week     Comment: 3-4 oz daily  . Drug Use: No  . Sexual Activity: Not on file   Other Topics Concern  . Not on file   Social History Narrative   Lives in [place] with spouse.   Retired Visual merchandiser     Review of Systems: General: negative for chills, fever, night sweats or weight changes.  Cardiovascular: negative for chest pain, dyspnea on exertion, edema, orthopnea, palpitations, paroxysmal nocturnal dyspnea or shortness of breath Dermatological: negative for rash Respiratory: negative for cough or wheezing Urologic: negative for hematuria Abdominal: negative for nausea, vomiting, diarrhea, bright red blood per rectum, melena, or  hematemesis Neurologic: negative for visual changes, syncope, or dizziness All other systems reviewed and are otherwise negative except as noted above.    Blood pressure 138/80, pulse 76, height 5\' 11"  (1.803 m), weight 179 lb 6.4 oz (81.375 kg).  General appearance: alert, cooperative and no distress Lungs: clear to auscultation bilaterally Heart: regular rate and rhythm  EKG NST, QTc 445  ASSESSMENT AND PLAN:   Paroxysmal atrial fibrillation Holding NSR. He saw Dr Johney Frame earlier this month and his medications were adjusted down.[name]  HYPERTENSION Controled  HYPERLIPIDEMIA On Crestor   PLAN  He is to follow up with Dr Johney Frame in December. He can see Dr Allyson Sabal in March.  Sira Adsit KPA-C 12/29/2012 1:56 PM

## 2012-12-29 NOTE — Assessment & Plan Note (Signed)
On Crestor 

## 2012-12-30 ENCOUNTER — Other Ambulatory Visit: Payer: Self-pay | Admitting: Gastroenterology

## 2012-12-30 ENCOUNTER — Encounter: Payer: Self-pay | Admitting: Cardiology

## 2013-01-03 ENCOUNTER — Telehealth: Payer: Self-pay | Admitting: Gastroenterology

## 2013-01-03 NOTE — Telephone Encounter (Signed)
Pt states he is having a lot of pressure at the top of his stomach. States it seems to be worse right after he eats. Requests to be seen sooner than 1st available. Pt scheduled to see Mike Gip PA tomorrow at 8:30am. Pt aware of appt.

## 2013-01-04 ENCOUNTER — Ambulatory Visit (INDEPENDENT_AMBULATORY_CARE_PROVIDER_SITE_OTHER): Payer: Medicare Other | Admitting: Physician Assistant

## 2013-01-04 ENCOUNTER — Other Ambulatory Visit (INDEPENDENT_AMBULATORY_CARE_PROVIDER_SITE_OTHER): Payer: Medicare Other

## 2013-01-04 ENCOUNTER — Encounter: Payer: Self-pay | Admitting: Gastroenterology

## 2013-01-04 ENCOUNTER — Encounter: Payer: Self-pay | Admitting: Physician Assistant

## 2013-01-04 ENCOUNTER — Other Ambulatory Visit: Payer: Self-pay | Admitting: *Deleted

## 2013-01-04 VITALS — BP 138/70 | HR 100 | Ht 71.0 in | Wt 177.0 lb

## 2013-01-04 DIAGNOSIS — R1013 Epigastric pain: Secondary | ICD-10-CM

## 2013-01-04 DIAGNOSIS — R110 Nausea: Secondary | ICD-10-CM

## 2013-01-04 DIAGNOSIS — K219 Gastro-esophageal reflux disease without esophagitis: Secondary | ICD-10-CM

## 2013-01-04 DIAGNOSIS — R140 Abdominal distension (gaseous): Secondary | ICD-10-CM

## 2013-01-04 DIAGNOSIS — R141 Gas pain: Secondary | ICD-10-CM

## 2013-01-04 LAB — CBC WITH DIFFERENTIAL/PLATELET
Basophils Absolute: 0 10*3/uL (ref 0.0–0.1)
Basophils Relative: 0.3 % (ref 0.0–3.0)
Eosinophils Absolute: 0.1 10*3/uL (ref 0.0–0.7)
HCT: 46.7 % (ref 39.0–52.0)
Hemoglobin: 16.3 g/dL (ref 13.0–17.0)
Lymphs Abs: 1 10*3/uL (ref 0.7–4.0)
MCHC: 34.8 g/dL (ref 30.0–36.0)
Neutro Abs: 4.3 10*3/uL (ref 1.4–7.7)
RBC: 4.97 Mil/uL (ref 4.22–5.81)
RDW: 12.7 % (ref 11.5–14.6)

## 2013-01-04 LAB — COMPREHENSIVE METABOLIC PANEL
ALT: 22 U/L (ref 0–53)
AST: 25 U/L (ref 0–37)
Albumin: 4.2 g/dL (ref 3.5–5.2)
Alkaline Phosphatase: 83 U/L (ref 39–117)
Calcium: 9.5 mg/dL (ref 8.4–10.5)
Chloride: 106 mEq/L (ref 96–112)
Creatinine, Ser: 1.1 mg/dL (ref 0.4–1.5)
Glucose, Bld: 77 mg/dL (ref 70–99)
Potassium: 4.2 mEq/L (ref 3.5–5.1)

## 2013-01-04 MED ORDER — OMEPRAZOLE 40 MG PO CPDR: 40.0000 mg | DELAYED_RELEASE_CAPSULE | Freq: Every day | ORAL | Status: DC

## 2013-01-04 NOTE — Progress Notes (Signed)
Reviewed and agree with management. Irean Kendricks D. Deaaron Fulghum, M.D., FACG  

## 2013-01-04 NOTE — Patient Instructions (Addendum)
We sent refills for the Prilosec 40 mg ( Omeprazole ) . You have been scheduled for an abdominal ultrasound at [facility] (1st floor of hospital) on Wednesday 10-1-2014at 9:00 am. Please arrive 15 minutes prior to your appointment for registration. Make certain not to have anything to eat or drink 6 hours prior to your appointment. Should you need to reschedule your appointment, please contact radiology at [phone]. This test typically takes about 30 minutes to perform.  You have been scheduled for an endoscopy with propofol. Please follow written instructions given to you at your visit today. If you use inhalers (even only as needed), please bring them with you on the day of your procedure.

## 2013-01-04 NOTE — Progress Notes (Signed)
Subjective:    Patient ID: Luis [name], male    DOB: [birth year], 67 y.o.   MRN: [id number]  HPI  Luis [name] is a pleasant 67 year old white male known to Dr. [name]. He had a remote colonoscopy in 2003 which showed diverticulosis and 3 small polyps all of which were hyperplastic. He also has history of chronic GERD and had a food impaction a few years back and then EGD in 2012 with dilation of an esophageal stricture with a balloon. He has been maintained on Prilosec. Patient also has history of atrial fibrillation he has undergone prior ablation and has been on Xarelto.  [name] He also has hypertension and hyperlipidemia. Pacing comes in today with new complaint of upper abdominal discomfort which she's been having over the past several days. He says he admits he had not been taking Prilosec on regular basis but had started back recently. He says he's feeling a lot of pressure bloating and discomfort in his upper abdomen especially after eating and as the day goes on. He says the more he eats the more prevalent this is. He has had some mild queasiness and nausea but no vomiting. His appetite has been fine his weight is stable he has no changes in his bowel habits melena or hematochezia. He denies any dysphagia or odynophagia. He describes his upper [place] discomfort as a bloated tight distended feeling definitely worse postprandially. He has not been non-any regular aspirin takes an occasional Aleve.    Review of Systems  Constitutional: Negative.   HENT: Negative.   Eyes: Negative.   Respiratory: Negative.   Gastrointestinal: Positive for nausea, abdominal pain and abdominal distention.  Endocrine: Negative.   Genitourinary: Negative.   Skin: Negative.   Allergic/Immunologic: Negative.   Neurological: Negative.   Hematological: Negative.   Psychiatric/Behavioral: Negative.    Outpatient Prescriptions Prior to Visit  Medication Sig Dispense Refill  . diltiazem (CARDIZEM CD) 240 MG 24 hr  capsule 240mg  po daily  30 capsule  5  . flecainide (TAMBOCOR) 50 MG tablet Take 1 tablet (50 mg total) by mouth 2 (two) times daily.  180 tablet  3  . Rivaroxaban (XARELTO) 20 MG TABS Take 1 tablet (20 mg total) by mouth daily.  30 tablet  11  . rosuvastatin (CRESTOR) 10 MG tablet Take 10 mg by mouth every evening.      [name] omeprazole (PRILOSEC) 40 MG capsule TAKE 1-2 CAPSULES BY MOUTH 30 MINUTES BEFORE A MEAL  90 capsule  0  . Rivaroxaban (XARELTO) 20 MG TABS tablet Take 1 tablet (20 mg total) by mouth daily.  28 tablet  0   No facility-administered medications prior to visit.       Allergies  Allergen Reactions  . Lopressor [Metoprolol]     Fatigue   Patient Active Problem List   Diagnosis Date Noted  . GERD (gastroesophageal reflux disease) 01/04/2013  . Hematuria 09/14/2012  . Normal coronary arteries 09/13/2012  . HYPERLIPIDEMIA 04/11/2010  . HYPERTENSION 04/11/2010  . Paroxysmal atrial fibrillation 04/11/2010  . HEMORRHOIDS 04/11/2010  . 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 DIVERTICULAR DISEASE 04/11/2010  . COLONIC POLYPS, HYPERPLASTIC, HX OF 04/11/2010  . RENAL CALCULUS, HX OF 04/11/2010  . ANAL FISSURE, HX OF 04/11/2010   History  Substance Use Topics  . Smoking status: Former Smoker -- 35 years    Types: Cigarettes    Quit date: 11/27/1994  . Smokeless tobacco: Never Used  . Alcohol Use: 3.0 oz/week    6  drink(s) per week     Comment: 3-4 oz daily   family history includes CAD in his father; Heart Problems in his father; Heart Problems (age of onset: 40) in his brother; Hypertension in his father; Pancreatic cancer in his mother; Stroke in his father.  Objective:   Physical Exam  well-developed white male in no acute distress, pleasant blood pressure 138/70 pulse 100 height 5 foot 11 weight 177. HEENT; nontraumatic normocephalic EOMI PERRLA sclera anicteric, Supple; no JVD, Cardiovascular; regular rate and rhythm with S1-S2 no murmur or gallop,  Pulmonary; clear bilaterally, Abdomen ;soft, nondistended, he is mildly tender in the epigastrium there is no guarding or rebound no palpable mass or hepatosplenomegaly bowel sounds are active, Rectal; exam not done, Extremities; no clubbing cyanosis or edema skin warm and dry, Psych; mood and affect normal and appropriate.        Assessment & Plan:  #54  67 year old male with chronic GERD and history of distal esophageal stricture status post dilation 2012 with new complaint of epigastric discomfort bloating and tightness over the past week which is exacerbated by by mouth intake. Etiology not clear we'll need to rule out gallbladder disease, peptic ulcer disease, partial outlet obstruction, or occult neoplasm.  #2 chronic anticoagulation with Xarelto #3 history of atrial fibrillation status post ablation #4 history of hyperplastic colon polyps last colonoscopy 2003 due for followup #5 history of ureteral lithiasis  Plan; check CBC with differential and CMET today He is  scheduled for upper abdominal ultrasound Scheduled for upper endoscopy with Dr. Ginger Carne will be done with patient on Xarelto as is a diagnostic exam. Procedure discussed with patient and he is agreeable to proceed  Increase Prilosec to 40 mg by mouth twice daily x1 month then once daily thereafter. Further plans pending results of above-if EGD and ultrasound are both negative he may need CT of the abdomen and pelvis We also discussed followup colonoscopy he would prefer to sort out his current issues first and then schedule that at a later date.

## 2013-01-05 ENCOUNTER — Ambulatory Visit (HOSPITAL_COMMUNITY)
Admission: RE | Admit: 2013-01-05 | Discharge: 2013-01-05 | Disposition: A | Discharge status: Home / Self Care | Payer: Medicare Other | Source: Ambulatory Visit | Attending: Physician Assistant | Admitting: Physician Assistant

## 2013-01-05 DIAGNOSIS — R109 Unspecified abdominal pain: Secondary | ICD-10-CM | POA: Insufficient documentation

## 2013-01-05 DIAGNOSIS — R140 Abdominal distension (gaseous): Secondary | ICD-10-CM

## 2013-01-05 DIAGNOSIS — R1013 Epigastric pain: Secondary | ICD-10-CM

## 2013-01-05 DIAGNOSIS — R110 Nausea: Secondary | ICD-10-CM

## 2013-01-06 ENCOUNTER — Telehealth: Payer: Self-pay | Admitting: Physician Assistant

## 2013-01-06 NOTE — Telephone Encounter (Signed)
Patient wants to let Mike Gip, PA know that increasing his Omeprazole to BID has made a great improvement in his condition.

## 2013-01-07 ENCOUNTER — Encounter: Payer: Self-pay | Admitting: Gastroenterology

## 2013-01-07 ENCOUNTER — Ambulatory Visit (AMBULATORY_SURGERY_CENTER): Payer: Medicare Other | Admitting: Gastroenterology

## 2013-01-07 VITALS — BP 107/63 | HR 63 | Temp 97.6°F | Resp 19 | Ht 71.0 in | Wt 177.0 lb

## 2013-01-07 DIAGNOSIS — K2970 Gastritis, unspecified, without bleeding: Secondary | ICD-10-CM

## 2013-01-07 DIAGNOSIS — K2990 Gastroduodenitis, unspecified, without bleeding: Secondary | ICD-10-CM

## 2013-01-07 DIAGNOSIS — R1013 Epigastric pain: Secondary | ICD-10-CM

## 2013-01-07 MED ORDER — SODIUM CHLORIDE 0.9 % IV SOLN: 500.0000 mL | INTRAVENOUS | Status: DC

## 2013-01-07 MED ORDER — DEXLANSOPRAZOLE 60 MG PO CPDR: 60.0000 mg | DELAYED_RELEASE_CAPSULE | Freq: Every day | ORAL | Status: DC

## 2013-01-07 NOTE — Progress Notes (Signed)
Stable to RR 

## 2013-01-07 NOTE — Progress Notes (Signed)
Patient did not experience any of the following events: a burn prior to discharge; a fall within the facility; wrong site/side/patient/procedure/implant event; or a hospital transfer or hospital admission upon discharge from the facility. (G8907)Patient did not have preoperative order for IV antibiotic SSI prophylaxis. (G8918) ewm 

## 2013-01-07 NOTE — Patient Instructions (Addendum)
YOU HAD AN ENDOSCOPIC PROCEDURE TODAY AT THE [facility]: Refer to the procedure report that was given to you for any specific questions about what was found during the examination.  If the procedure report does not answer your questions, please call your gastroenterologist to clarify.  If you requested that your care partner not be given the details of your procedure findings, then the procedure report has been included in a sealed envelope for you to review at your convenience later.  YOU SHOULD EXPECT: Some feelings of bloating in the abdomen. Passage of more gas than usual.  Walking can help get rid of the air that was put into your GI tract during the procedure and reduce the bloating. If you had a lower endoscopy (such as a colonoscopy or flexible sigmoidoscopy) you may notice spotting of blood in your stool or on the toilet paper. If you underwent a bowel prep for your procedure, then you may not have a normal bowel movement for a few days.  DIET: Your first meal following the procedure should be a light meal and then it is ok to progress to your normal diet.  A half-sandwich or bowl of soup is an example of a good first meal.  Heavy or fried foods are harder to digest and may make you feel nauseous or bloated.  Likewise meals heavy in dairy and vegetables can cause extra gas to form and this can also increase the bloating.  Drink plenty of fluids but you should avoid alcoholic beverages for 24 hours.  ACTIVITY: Your care partner should take you home directly after the procedure.  You should plan to take it easy, moving slowly for the rest of the day.  You can resume normal activity the day after the procedure however you should NOT DRIVE or use heavy machinery for 24 hours (because of the sedation medicines used during the test).    SYMPTOMS TO REPORT IMMEDIATELY: A gastroenterologist can be reached at any hour.  During normal business hours, 8:30 AM to 5:00 PM Monday through Friday,  call [phone].  After hours and on weekends, please call the GI answering service at [phone] emergency number  who will take a message and have the physician on call contact you.     Following upper endoscopy (EGD)  Vomiting of blood or coffee ground material  New chest pain or pain under the shoulder blades  Painful or persistently difficult swallowing  New shortness of breath  Fever of 100F or higher  Black, tarry-looking stools  FOLLOW UP: If any biopsies were taken you will be contacted by phone or by letter within the next 1-3 weeks.  Call your gastroenterologist if you have not heard about the biopsies in 3 weeks.  Our staff will call the home number listed on your records the next business day following your procedure to check on you and address any questions or concerns that you may have at that time regarding the information given to you following your procedure. This is a courtesy call and so if there is no answer at the home number and we have not heard from you through the emergency physician on call, we will assume that you have returned to your regular daily activities without incident.  SIGNATURES/CONFIDENTIALITY: You and/or your care partner have signed paperwork which will be entered into your electronic medical record.  These signatures attest to the fact that that the information above on your After Visit Summary has been reviewed and  is understood.  Full responsibility of the confidentiality of this discharge information lies with you and/or your care-partner  Handouts on gastritis  Trial of dexilant 60 mg once a day. Please take this medicine 20-30 minutes before the first meal of the day.  Call [phone] for an office visit in 3-4 weeks in the next 1-3 days

## 2013-01-07 NOTE — Progress Notes (Signed)
Called to room to assist during endoscopic procedure.  Patient ID and intended procedure confirmed with present staff. Received instructions for my participation in the procedure from the performing physician.  

## 2013-01-07 NOTE — Op Note (Signed)
[facility] [address] [place], [address]   ENDOSCOPY PROCEDURE REPORT  PATIENT: Luis [name], Luis [name]  MR#: [id number] BIRTHDATE: [birth year] , 67  yrs. old GENDER: Male ENDOSCOPIST: Louis Meckel, MD REFERRED BY:  Nila Nephew, M.D. PROCEDURE DATE:  01/07/2013 PROCEDURE:  EGD w/ biopsy ASA CLASS:     Class II INDICATIONS:  Dyspepsia. MEDICATIONS: MAC sedation, administered by CRNA, propofol (Diprivan) 150mg  IV, and Simethicone 0.6cc PO TOPICAL ANESTHETIC:  DESCRIPTION OF PROCEDURE: After the risks benefits and alternatives of the procedure were thoroughly explained, informed consent was obtained.  The LB [id number] endoscope was introduced through the mouth and advanced to the third portion of the duodenum. Without limitations.  The instrument was slowly withdrawn as the mucosa was fully examined.        ESOPHAGUS: A moderately severe Schatzki ring was found at the gastroesophageal junction and was widely open.  In the gastric fundus there was mild edema/erythema.  Biopsies were taken.   The remainder of the upper endoscopy exam was otherwise normal.  Retroflexed views revealed no abnormalities.     The scope was then withdrawn from the patient and the procedure completed.  COMPLICATIONS: There were no complications. ENDOSCOPIC IMPRESSION: 1.   Schatzki ring was found at the gastroesophageal junction 2.   In the gastric fundus there was mild edema/erythema.  Biopsies were taken. 3.   The remainder of the upper endoscopy exam was otherwise normal  RECOMMENDATIONS: Trial of dexalant 60mg  daily OV 3-4 weeks REPEAT EXAM:  eSigned:  Louis Meckel, MD 01/07/2013 8:23 AM   CC:  PATIENT NAME:  [name], [name] MR#: [id number]

## 2013-01-10 ENCOUNTER — Telehealth: Payer: Self-pay | Admitting: *Deleted

## 2013-01-10 NOTE — Telephone Encounter (Signed)
Linda  Please see Dr. Marzetta Board order.

## 2013-01-10 NOTE — Telephone Encounter (Signed)
Left message that samples are out front of Dexilant for patient

## 2013-01-10 NOTE — Telephone Encounter (Signed)
He was switched to dexilant because of symptoms of reflux and dyspepsia refractory to Prilosec.  Please provide that information to the insurance company.

## 2013-01-10 NOTE — Telephone Encounter (Signed)
  Follow up Call-  Call back number 01/07/2013  Post procedure Call Back phone  # [phone]  Permission to leave phone message Yes     Patient questions:  Do you have a fever, pain , or abdominal swelling? no Pain Score  0 *  Have you tolerated food without any problems? yes  Have you been able to return to your normal activities? yes  Do you have any questions about your discharge instructions: Diet   no Medications  yes Follow up visit  no  Do you have questions or concerns about your Care? no  Actions: * If pain score is 4 or above: No action needed, pain <4. Patient stated he was unable to have Dexilant filled at pharmacy. Pharmacy stating unable to fill related to needed explanation  From Dr. [name] as to reason for need for Dexilant for insurance purposes. Patient stating he is on Omeprazole 40 mg twice daily since last appointment with Amy Esterwood on week ago. Patient is going out of town for 5 days and requests samples if possible to tide him over until insurance issues are resolved. Patient does state that his symptoms have improved on twice daily Omeprazole.

## 2013-01-10 NOTE — Telephone Encounter (Signed)
Called patient to inform of Dr. Marzetta Board order. Patient is going out of town tomorrow am.

## 2013-01-12 ENCOUNTER — Encounter: Payer: Self-pay | Admitting: Gastroenterology

## 2013-01-31 ENCOUNTER — Encounter: Payer: Self-pay | Admitting: Gastroenterology

## 2013-01-31 ENCOUNTER — Telehealth: Payer: Self-pay | Admitting: *Deleted

## 2013-01-31 ENCOUNTER — Ambulatory Visit (INDEPENDENT_AMBULATORY_CARE_PROVIDER_SITE_OTHER): Payer: Medicare Other | Admitting: Gastroenterology

## 2013-01-31 VITALS — BP 130/70 | HR 82 | Ht 70.5 in | Wt 179.1 lb

## 2013-01-31 DIAGNOSIS — Z86010 Personal history of colonic polyps: Secondary | ICD-10-CM

## 2013-01-31 DIAGNOSIS — K2970 Gastritis, unspecified, without bleeding: Secondary | ICD-10-CM | POA: Insufficient documentation

## 2013-01-31 DIAGNOSIS — K222 Esophageal obstruction: Secondary | ICD-10-CM

## 2013-01-31 MED ORDER — NA SULFATE-K SULFATE-MG SULF 17.5-3.13-1.6 GM/177ML PO SOLN: 1.0000 | Freq: Once | ORAL | Status: DC

## 2013-01-31 NOTE — Assessment & Plan Note (Addendum)
Plan followup colonoscopy since it has been 10 years since his last exam.  I'll check with his cardiologist whether xarelto can be held. The risk of holding anticoagulation therapy or antiplatelet medications was discussed including the increased risk for thromboembolic disease that may include DVT, pulmonary emboli and stroke. The patient understands this risk and is willing to proceed with temporally holding the medication provided that this is approved by her PCP or cardiologist.

## 2013-01-31 NOTE — Telephone Encounter (Signed)
01/31/2013   RE: ERVEN RAMSON DOB: 20-Nov-1945 MRN: [id number]   Dear  Dr Johney Frame,    We have scheduled the above patient for an endoscopic procedure. Our records show that he is on anticoagulation therapy.   Please advise as to how long the patient may come off his therapy of Xeralto  prior to the procedure, which is scheduled for 04/14/2013.  Please fax back/ or route the completed form to Kinsley Nicklaus at [phone].   Sincerely,    Merri Ray

## 2013-01-31 NOTE — Telephone Encounter (Signed)
Hold xarelto 24 hours prior to the procedure and resume 24 hours afterwards. 

## 2013-01-31 NOTE — Assessment & Plan Note (Addendum)
Significant improvement with change to dexilant.  Recommendations #1 complete one month course of dexilant and switch back to omeprazole 40 mg daily

## 2013-01-31 NOTE — Assessment & Plan Note (Addendum)
Patient has a Schatzki's ring but he is  asymptomatic.

## 2013-01-31 NOTE — Patient Instructions (Signed)
You have been scheduled for a colonoscopy with propofol. Please follow written instructions given to you at your visit today.  Please pick up your prep kit at the pharmacy within the next 1-3 days. If you use inhalers (even only as needed), please bring them with you on the day of your procedure. Your physician has requested that you go to www.startemmi.com and enter the access code given to you at your visit today. This web site gives a general overview about your procedure. However, you should still follow specific instructions given to you by our office regarding your preparation for the procedure.  We will contact you about holding your Xeralto Resume Omeprazole Use Mylanta or Gas X as needed

## 2013-01-31 NOTE — Progress Notes (Signed)
History of Present Illness:  Luis [name] has returned following upper endoscopy where a mild nonspecific gastritis was seen.  A Schatzki's ring was seen as well. On dexilant symptoms have significantly improved.  He occasionally has a pressure-like discomfort in his upper abdomen which is relieved by belching.    Review of Systems: Pertinent positive and negative review of systems were noted in the above HPI section. All other review of systems were otherwise negative.    Current Medications, Allergies, Past Medical History, Past Surgical History, Family History and Social History were reviewed in [employer] electronic medical record  Vital signs were reviewed in today's medical record. Physical Exam: General: Well developed , well nourished, no acute distress

## 2013-02-02 ENCOUNTER — Encounter: Payer: Self-pay | Admitting: Gastroenterology

## 2013-02-02 NOTE — Telephone Encounter (Signed)
Spoke with wife About patient holding xaerlto

## 2013-02-10 ENCOUNTER — Other Ambulatory Visit: Payer: Self-pay

## 2013-02-11 IMAGING — CR DG CHEST 2V
2 series · 2 of 2 positions shown · non-contrast
Comparison: 07/19/2006

CLINICAL DATA: Chest pain and shortness of breath

CHEST - 2 VIEW

[w chest pa]
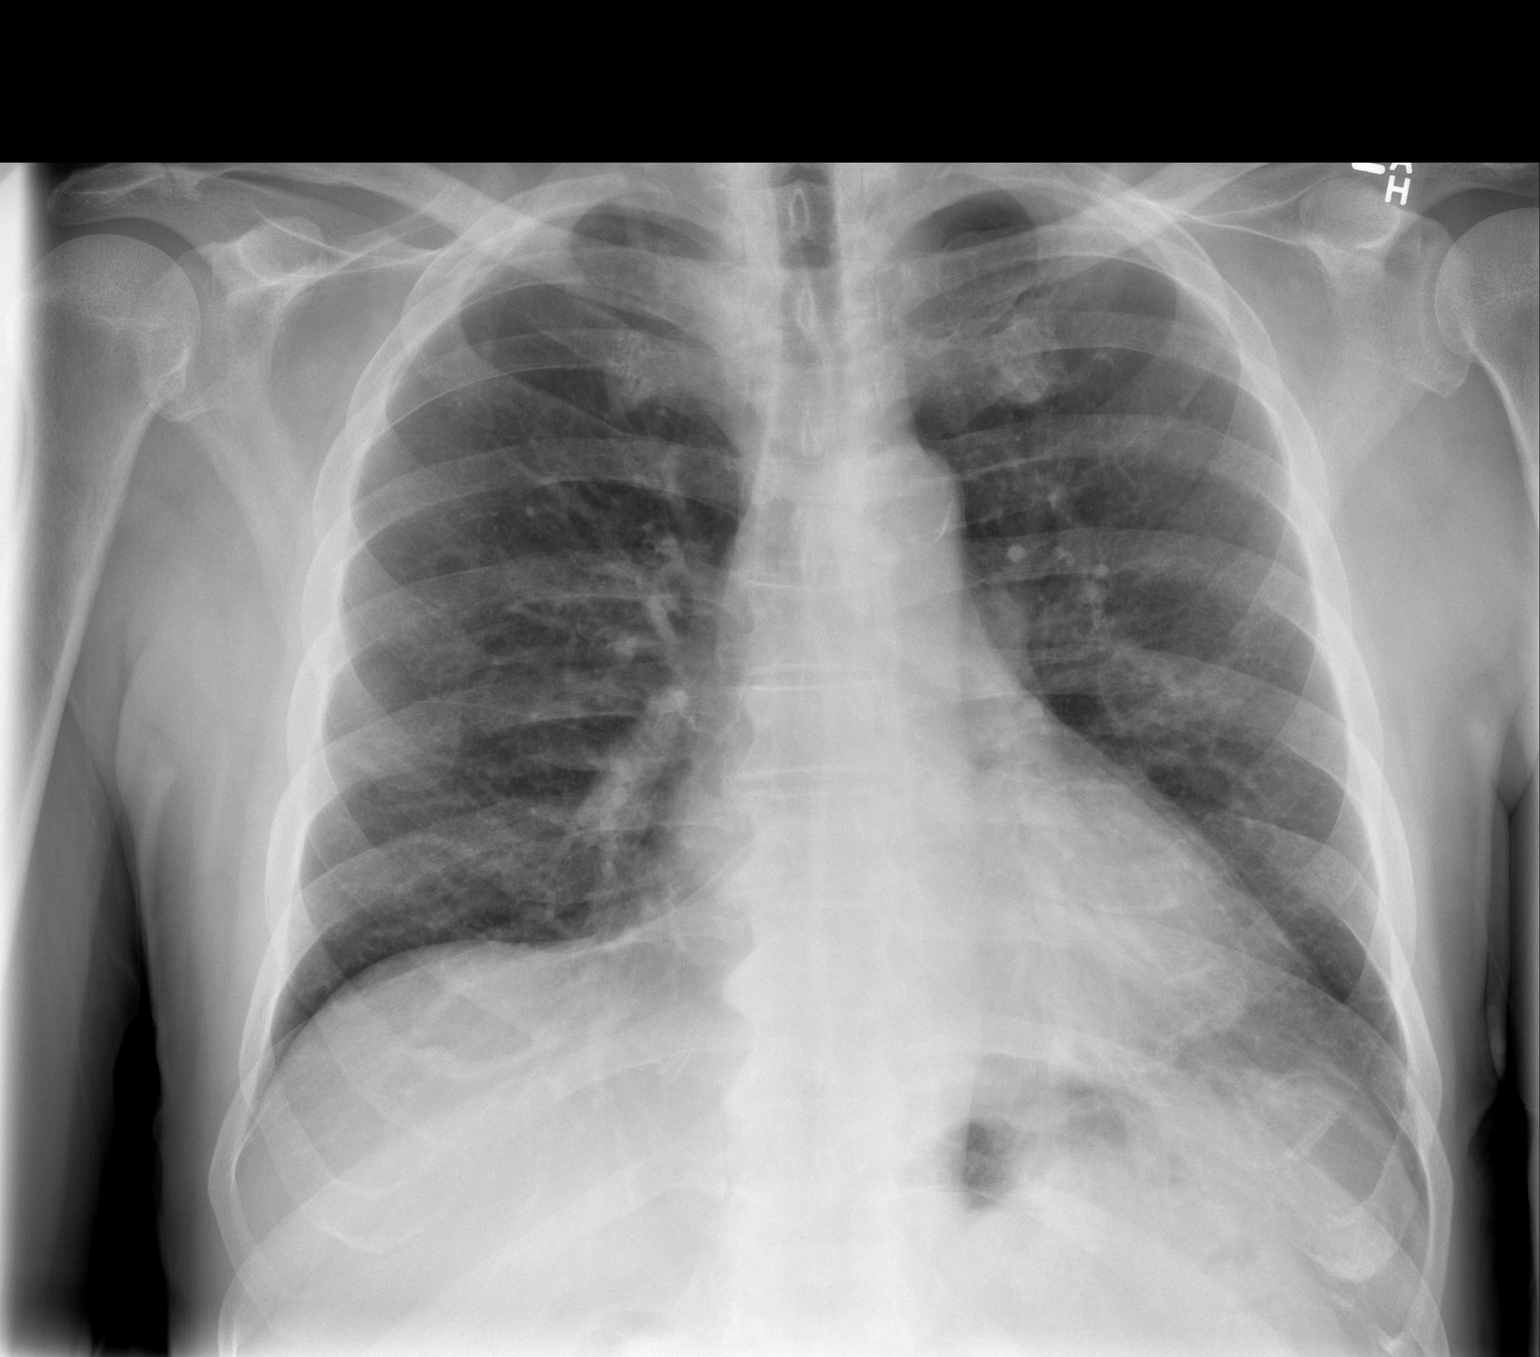

[w chest lat]
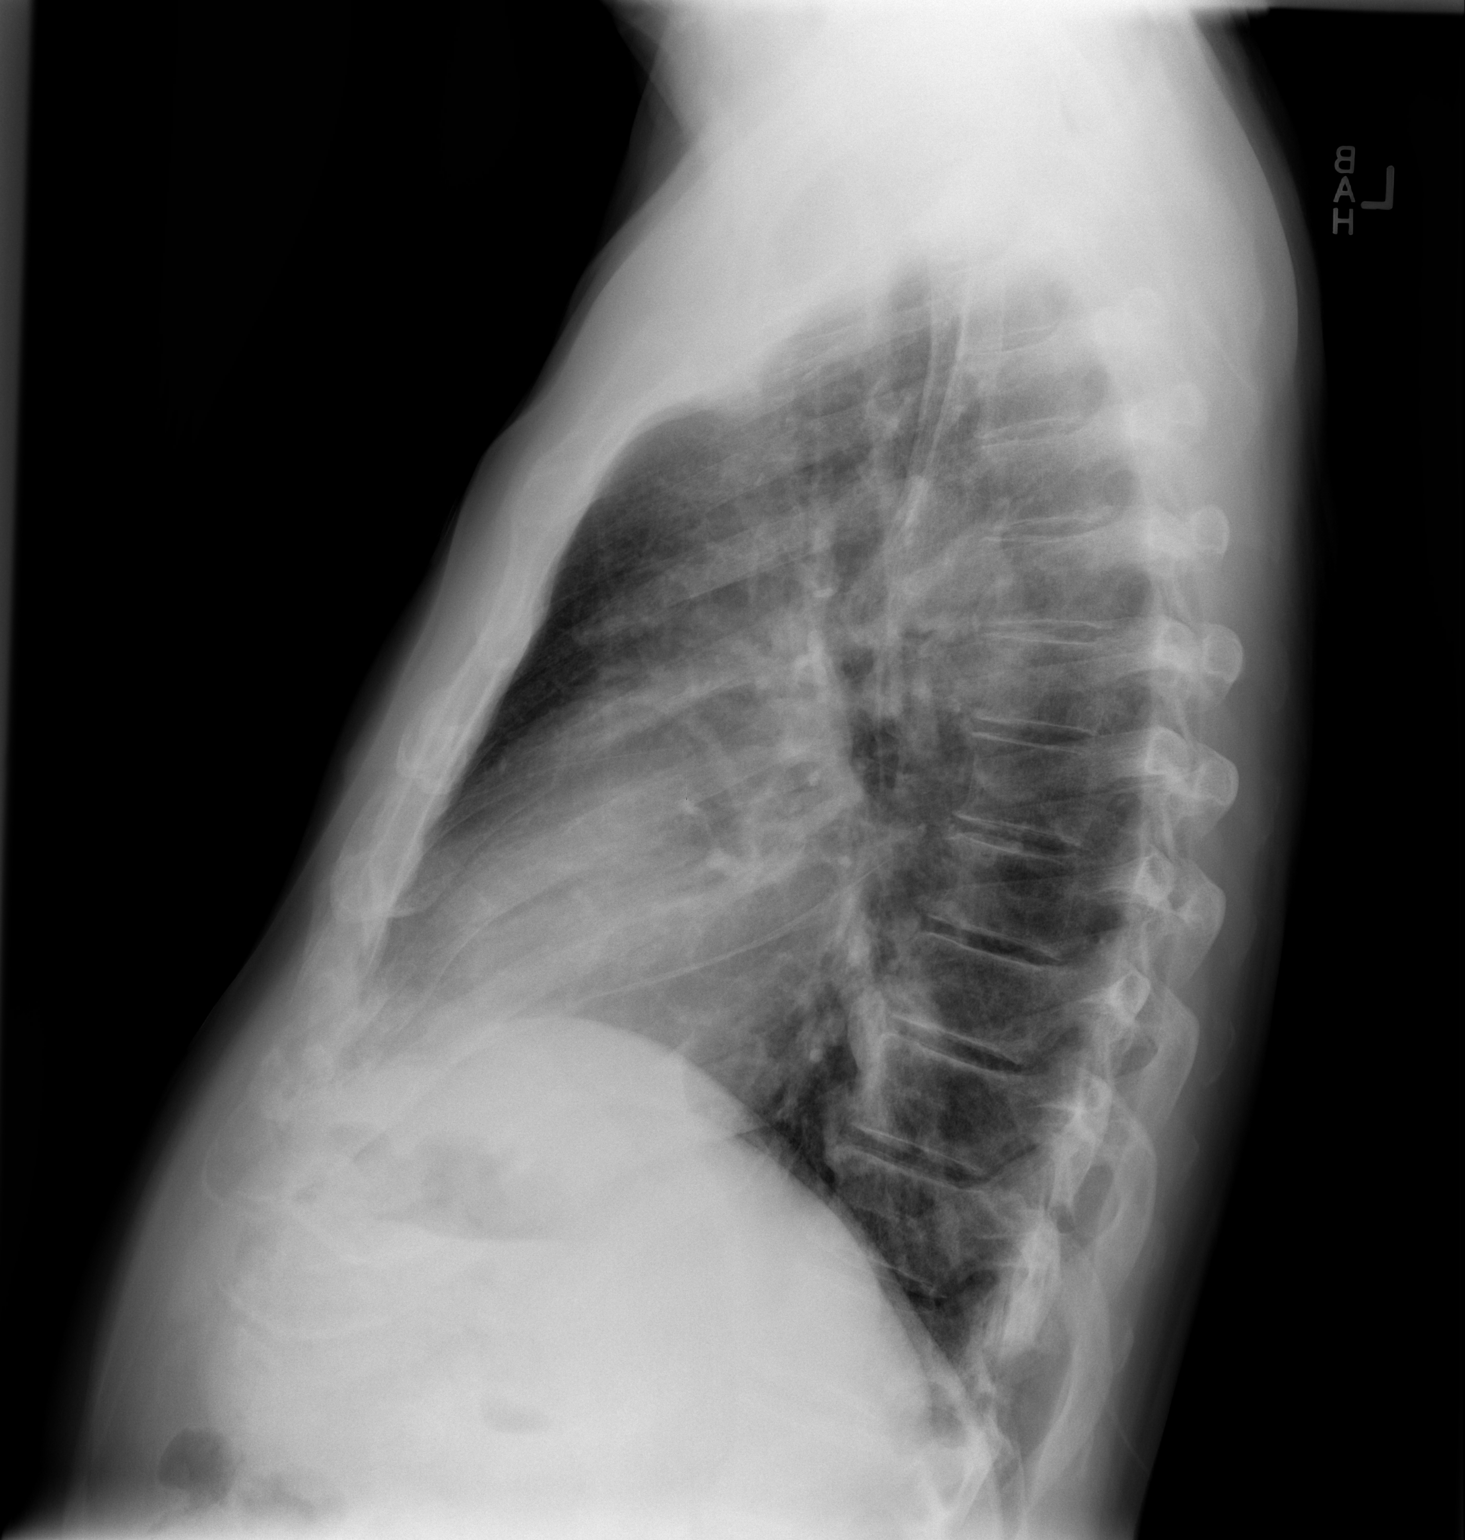

[2 of 2 positions shown; findings below may reference images not displayed]

FINDINGS: Upper limits normal heart size noted.
Mild peribronchial thickening is identified.
There is no evidence of focal airspace disease, pulmonary edema,
suspicious pulmonary nodule/mass, pleural effusion, or
pneumothorax.
No acute bony abnormalities are identified.
IMPRESSION: No evidence of active cardiopulmonary disease.

## 2013-03-09 ENCOUNTER — Ambulatory Visit (INDEPENDENT_AMBULATORY_CARE_PROVIDER_SITE_OTHER): Payer: Medicare Other | Admitting: Internal Medicine

## 2013-03-09 ENCOUNTER — Encounter: Payer: Self-pay | Admitting: Internal Medicine

## 2013-03-09 VITALS — BP 136/79 | HR 70 | Ht 71.0 in | Wt 179.1 lb

## 2013-03-09 DIAGNOSIS — I4891 Unspecified atrial fibrillation: Secondary | ICD-10-CM

## 2013-03-09 DIAGNOSIS — I480 Paroxysmal atrial fibrillation: Secondary | ICD-10-CM

## 2013-03-09 MED ORDER — METOPROLOL TARTRATE 25 MG PO TABS: ORAL_TABLET | ORAL | Status: DC

## 2013-03-09 MED ORDER — DILTIAZEM HCL ER BEADS 240 MG PO CP24: 240.0000 mg | ORAL_CAPSULE | Freq: Every day | ORAL | Status: DC

## 2013-03-09 NOTE — Patient Instructions (Signed)
Your physician wants you to follow-up in: 3 months with Dr Johney Frame Bonita Quin will receive a reminder letter in the mail two months in advance. If you don't receive a letter, please call our office to schedule the follow-up appointment.  Your physician has recommended you make the following change in your medication:  1) Stop Flecainide 2) Start Lopressor 25mg  as needed

## 2013-03-09 NOTE — Progress Notes (Signed)
PCP: Enrique Sack, MD Primary Cardiologist:  Dr Dennison Luis [name] is a 67 y.o. male who presents today for electrophysiology followup.  Since his recent afib ablation, the patient reports doing very well.  He has had no afib since June.   He is pleased with his current health state.  He finds that when he is emotionally upset that he has flushing and sinus tachycardia now that he is not on a beta blocker. Today, he denies symptoms of chest pain, shortness of breath,  lower extremity edema,  presyncope, or syncope.  The patient is otherwise without complaint today.   Past Medical History  Diagnosis Date  . GERD (gastroesophageal reflux disease)   . History of kidney stones 07/2006    x 1  . Paroxysmal atrial fibrillation     2008 and [address], PVI 09/07/12  . Hyperlipidemia   . Normal coronary arteries 2008    Normal LV function  . Hypertension    Past Surgical History  Procedure Laterality Date  . Cardiac catheterization Left 07/20/2006    No significant CAD, EF 60%, continue medical therapy  . Tee without cardioversion N/A 09/06/2012    Procedure: TRANSESOPHAGEAL ECHOCARDIOGRAM (TEE);  Surgeon: Chrystie Nose, MD;  Location: [facility] END[facility]COPY;  Service: Cardiovascular;  Laterality: N/A;  . Atrial ablation surgery  09/07/12    PVI by Dr Johney Frame    Current Outpatient Prescriptions  Medication Sig Dispense Refill  . diltiazem (TAZTIA XT) 240 MG 24 hr capsule Take 1 capsule (240 mg total) by mouth daily.  90 capsule  3  . omeprazole (PRILOSEC) 40 MG capsule Take 1-2 tablets once a day with meals      . Rivaroxaban (XARELTO) 20 MG TABS Take 1 tablet (20 mg total) by mouth daily.  30 tablet  11  . rosuvastatin (CRESTOR) 10 MG tablet Take 10 mg by mouth every evening.      . metoprolol tartrate (LOPRESSOR) 25 MG tablet Take by mouth as needed  30 tablet  3   No current facility-administered medications for this visit.    Physical Exam: Filed Vitals:   03/09/13 1216  BP: 136/79  Pulse:  70  Height: 5\' 11"  (1.803 m)  Weight: 179 lb 1.9 oz (81.248 kg)    GEN- The patient is well appearing, alert and oriented x 3 today.   Head- normocephalic, atraumatic Eyes-  Sclera clear, conjunctiva pink Ears- hearing intact Oropharynx- clear Lungs- Clear to ausculation bilaterally, normal work of breathing Heart- Regular rate and rhythm, no murmurs, rubs or gallops, PMI not laterally displaced GI- soft, NT, ND, + BS Extremities- no clubbing, cyanosis, or edema  ekg today reveals sinus bradycardia 70 bpm, incomplete RBBB  Assessment and Plan:  1. afib Doing well s/p ablation,  Fatigue is much better off of beta blockers but he finds sinus tachycardia with emotional stress to be bothersome.  He would like to take metoprolol as needed.  I will stop flecainide today His CHADS2VASC score is 2 (age, HTN).  Continue xarelto.  Follow-up with Dr Allyson Sabal as scheduled Return to see me in 12 weeks

## 2013-03-21 ENCOUNTER — Ambulatory Visit (INDEPENDENT_AMBULATORY_CARE_PROVIDER_SITE_OTHER): Payer: Medicare Other | Admitting: Cardiovascular Disease

## 2013-03-21 ENCOUNTER — Encounter: Payer: Self-pay | Admitting: Cardiovascular Disease

## 2013-03-21 VITALS — BP 126/62 | HR 89 | Ht 71.0 in | Wt 177.4 lb

## 2013-03-21 DIAGNOSIS — I4891 Unspecified atrial fibrillation: Secondary | ICD-10-CM

## 2013-03-21 DIAGNOSIS — E785 Hyperlipidemia, unspecified: Secondary | ICD-10-CM

## 2013-03-21 DIAGNOSIS — I10 Essential (primary) hypertension: Secondary | ICD-10-CM

## 2013-03-21 DIAGNOSIS — I480 Paroxysmal atrial fibrillation: Secondary | ICD-10-CM

## 2013-03-21 NOTE — Assessment & Plan Note (Signed)
Well-controlled on current medications 

## 2013-03-21 NOTE — Progress Notes (Signed)
03/21/2013 [name]   1946-04-07  [id number]  Primary Physician GREEN, Lorenda Ishihara, MD Primary Cardiologist: Runell Gess MD Roseanne Reno    HPI:  The patient is a very pleasant 67 year old, thin and fit-appearing, married Caucasian male, father of 4, grandfather to 2 grandchildren who I last saw 2 months ago. He has a history of paroxysmal atrial fibrillation maintaining sinus rhythm when I saw him in December. He has been evaluated by Dr. [name] at [facility] remotely back in May 2008 and was cath'd by Dr. [name] the month before revealing noncritical CAD with normal LV function. He does have a 35-pack-year history of tobacco abuse having quit August 1996. His other problems include history of hypertension, mild hyperlipidemia and family history of heart disease with a father who had an MI at age 9 and a brother who had stent placed at age 80. He did get occasional palpitations when I saw him back in December. He has had 2 episodes of symptomatic A-fib, the last of which occurred on June 19, 2012, which resulted in presyncope. He noticed this while he was playing golf. When he presented to the ER his heart rate was 144. He was placed on IV diltiazem and ultimately converted to sinus rhythm and was discharged home on flecainide 50 mg p.o. b.i.d. Because his CHADS2 score was 1 it was elected to keep him on aspirin.he underwent atrial fibrillation ablation by Dr. [name] on 09/07/12 with an excellent clinical result. He's had no recurrent episodes appear PAF. His Flecainide was discontinued. He is on Xarelto oral anticoagulation. He denies chest pain or shortness of breath.    Current Outpatient Prescriptions  Medication Sig Dispense Refill  . diltiazem (TAZTIA XT) 240 MG 24 hr capsule Take 1 capsule (240 mg total) by mouth daily.  90 capsule  3  . metoprolol succinate (TOPROL-XL) 25 MG 24 hr tablet Take 25 mg by mouth daily as needed.      . Omega-3 Fatty Acids (FISH  OIL PO) Take 1 capsule by mouth daily.      [name] omeprazole (PRILOSEC) 40 MG capsule Take 1-2 tablets once a day with meals      . Rivaroxaban (XARELTO) 20 MG TABS Take 1 tablet (20 mg total) by mouth daily.  30 tablet  11  . rosuvastatin (CRESTOR) 10 MG tablet Take 10 mg by mouth every evening.       No current facility-administered medications for this visit.    Allergies  Allergen Reactions  . Lopressor [Metoprolol]     Fatigue    History   Social History  . Marital Status: Married    Spouse Name: N/A    Number of Children: N/A  . Years of Education: N/A   Occupational History  . Not on file.   Social History Main Topics  . Smoking status: Former Smoker -- 35 years    Types: Cigarettes    Quit date: 11/27/1994  . Smokeless tobacco: Never Used  . Alcohol Use: 3.0 oz/week    6 drink(s) per week     Comment: 3-4 oz daily  . Drug Use: No  . Sexual Activity: Not on file   Other Topics Concern  . Not on file   Social History Narrative   Lives in [place] with spouse.   Retired Visual merchandiser     Review of Systems: General: negative for chills, fever, night sweats or weight changes.  Cardiovascular: negative for chest pain, dyspnea on  exertion, edema, orthopnea, palpitations, paroxysmal nocturnal dyspnea or shortness of breath Dermatological: negative for rash Respiratory: negative for cough or wheezing Urologic: negative for hematuria Abdominal: negative for nausea, vomiting, diarrhea, bright red blood per rectum, melena, or hematemesis Neurologic: negative for visual changes, syncope, or dizziness All other systems reviewed and are otherwise negative except as noted above.    Blood pressure 126/62, pulse 89, height 5\' 11"  (1.803 m), weight 177 lb 6.4 oz (80.468 kg).  General appearance: alert and no distress Neck: no adenopathy, no carotid bruit, no JVD, supple, symmetrical, trachea midline and thyroid not enlarged, symmetric, no  tenderness/mass/nodules Lungs: clear to auscultation bilaterally Heart: regular rate and rhythm, S1, S2 normal, no murmur, click, rub or gallop Extremities: extremities normal, atraumatic, no cyanosis or edema  EKG normal sinus rhythm at 89 with incomplete right bundle-branch block  ASSESSMENT AND PLAN:   Paroxysmal atrial fibrillation Paroxysmal atrial fibrillation status post atrial fibrillation ablation by Dr. [name] 09/07/12 without recurrent episodes. He is on Xarelto oral anticoagulation. His foot and that was discontinued. He is on long-acting Cardizem and when necessary beta blocker.  HYPERTENSION Well-controlled on current medications  HYPERLIPIDEMIA On statin drug followed by his PCP      Runell Gess MD [facility], [facility] 03/21/2013 9:34 AM

## 2013-03-21 NOTE — Assessment & Plan Note (Signed)
On statin drug followed by his PCP

## 2013-03-21 NOTE — Patient Instructions (Signed)
Your physician wants you to follow-up in: 6 months with Luke Kilroy PAC and 1 year with Dr Berry.  You will receive a reminder letter in the mail two months in advance. If you don't receive a letter, please call our office to schedule the follow-up appointment.  

## 2013-03-21 NOTE — Assessment & Plan Note (Signed)
Paroxysmal atrial fibrillation status post atrial fibrillation ablation by Dr. [name] 09/07/12 without recurrent episodes. He is on Xarelto oral anticoagulation. His foot and that was discontinued. He is on long-acting Cardizem and when necessary beta blocker.

## 2013-04-14 ENCOUNTER — Encounter: Payer: Medicare Other | Admitting: Gastroenterology

## 2013-04-19 ENCOUNTER — Other Ambulatory Visit: Payer: Self-pay | Admitting: *Deleted

## 2013-04-19 MED ORDER — DILTIAZEM HCL ER BEADS 240 MG PO CP24: 240.0000 mg | ORAL_CAPSULE | Freq: Every day | ORAL | Status: DC

## 2013-04-19 MED ORDER — ROSUVASTATIN CALCIUM 10 MG PO TABS: 10.0000 mg | ORAL_TABLET | Freq: Every evening | ORAL | Status: DC

## 2013-04-19 NOTE — Telephone Encounter (Signed)
Rx was sent to pharmacy electronically. 

## 2013-04-25 ENCOUNTER — Other Ambulatory Visit: Payer: Self-pay | Admitting: *Deleted

## 2013-04-25 DIAGNOSIS — I4891 Unspecified atrial fibrillation: Secondary | ICD-10-CM

## 2013-04-25 MED ORDER — RIVAROXABAN 20 MG PO TABS: 20.0000 mg | ORAL_TABLET | Freq: Every day | ORAL | Status: DC

## 2013-05-25 ENCOUNTER — Encounter: Payer: Self-pay | Admitting: Cardiology

## 2013-05-25 ENCOUNTER — Ambulatory Visit (INDEPENDENT_AMBULATORY_CARE_PROVIDER_SITE_OTHER): Payer: Medicare Other | Admitting: Cardiology

## 2013-05-25 VITALS — BP 110/62 | HR 93 | Ht 71.0 in | Wt 175.7 lb

## 2013-05-25 DIAGNOSIS — IMO0001 Reserved for inherently not codable concepts without codable children: Secondary | ICD-10-CM

## 2013-05-25 DIAGNOSIS — I4891 Unspecified atrial fibrillation: Secondary | ICD-10-CM

## 2013-05-25 DIAGNOSIS — E785 Hyperlipidemia, unspecified: Secondary | ICD-10-CM

## 2013-05-25 DIAGNOSIS — Z7901 Long term (current) use of anticoagulants: Secondary | ICD-10-CM

## 2013-05-25 DIAGNOSIS — I480 Paroxysmal atrial fibrillation: Secondary | ICD-10-CM

## 2013-05-25 DIAGNOSIS — R002 Palpitations: Secondary | ICD-10-CM | POA: Insufficient documentation

## 2013-05-25 DIAGNOSIS — I10 Essential (primary) hypertension: Secondary | ICD-10-CM

## 2013-05-25 DIAGNOSIS — Z0389 Encounter for observation for other suspected diseases and conditions ruled out: Secondary | ICD-10-CM

## 2013-05-25 NOTE — Assessment & Plan Note (Signed)
No problems since

## 2013-05-25 NOTE — Assessment & Plan Note (Signed)
This sounds mainly like extra systole as opposed to PAF

## 2013-05-25 NOTE — Patient Instructions (Signed)
Take your Toprol as directed for 3 days. Call if your HR is sustained greater than 110-120 Follow up with Dr [name] as scheduled.

## 2013-05-25 NOTE — Progress Notes (Signed)
05/25/2013 [name]   Mar 13, 1946  [id number]  Primary Physicia GREEN, [name], MD Primary Cardiologist: Dr Gwenlyn Found Dr [name]  HPI:  68 y/o followed by Dr Gwenlyn Found with a history of normal coronaries in 2008 and PAF. He had successful RFA June 2014 and has done well since. On Sunday prior to this visit he developed a GI illness and had nausea vomiting and diarrhea X 48 hrs. He says he has recovered. Since then he has had increases palpitations. He has not taken his prescribed prn Toprol for this. He describes extra beats and pauses.    Current Outpatient Prescriptions  Medication Sig Dispense Refill  . diltiazem (TAZTIA XT) 240 MG 24 hr capsule Take 1 capsule (240 mg total) by mouth daily.  90 capsule  3  . metoprolol succinate (TOPROL-XL) 25 MG 24 hr tablet Take 25 mg by mouth daily as needed.      . Omega-3 Fatty Acids (FISH OIL PO) Take 1 capsule by mouth daily.      [name] omeprazole (PRILOSEC) 40 MG capsule Take 20 mg by mouth daily.       . Rivaroxaban (XARELTO) 20 MG TABS tablet Take 1 tablet (20 mg total) by mouth daily.  90 tablet  2  . rosuvastatin (CRESTOR) 10 MG tablet Take 1 tablet (10 mg total) by mouth every evening.  90 tablet  1   No current facility-administered medications for this visit.    Allergies  Allergen Reactions  . Lopressor [Metoprolol]     Fatigue    History   Social History  . Marital Status: Married    Spouse Name: N/A    Number of Children: N/A  . Years of Education: N/A   Occupational History  . Not on file.   Social History Main Topics  . Smoking status: Former Smoker -- 35 years    Types: Cigarettes    Quit date: 11/27/1994  . Smokeless tobacco: Never Used  . Alcohol Use: 3.0 oz/week    6 drink(s) per week     Comment: 3-4 oz daily  . Drug Use: No  . Sexual Activity: Not on file   Other Topics Concern  . Not on file   Social History Narrative   Lives in [place] with spouse.   Retired Electrical engineer     Review of  Systems: General: negative for chills, fever, night sweats or weight changes.  Cardiovascular: negative for chest pain, dyspnea on exertion, edema, orthopnea, paroxysmal nocturnal dyspnea or shortness of breath Dermatological: negative for rash Respiratory: negative for cough or wheezing Urologic: negative for hematuria Abdominal: negative for bright red blood per rectum, melena, or hematemesis Neurologic: negative for visual changes, syncope, or dizziness All other systems reviewed and are otherwise negative except as noted above.    Blood pressure 110/62, pulse 93, height 5\' 11"  (1.803 m), weight 175 lb 11.2 oz (79.697 kg).  General appearance: alert, cooperative and no distress Lungs: clear to auscultation bilaterally Heart: regular rate and rhythm  EKG NSR, incomplete RBBB, QTc 455  ASSESSMENT AND PLAN:   Palpitations after GI illness This sounds mainly like extra systole as opposed to PAF  Paroxysmal atrial fibrillation- RFA June 2014 No problems since  Normal coronary arteries- (40-50%bDx1) 2008 .  HYPERTENSION .  HYPERLIPIDEMIA .  Chronic anticoagulation .    PLAN  I suggested to Mr Goines that his recent recurrence of palpitations was probably from his recent GI illness and would settle down. I suggested he take his  prn Toprol 25 mg as prescribed for next couple of days. He has an appointment with Dr [name] next month. He knows to call if anything changes, otherwise he can see Dr Gwenlyn Found in 5 months as scheduled.   [facility] 05/25/2013 4:32 PM

## 2013-05-31 ENCOUNTER — Encounter: Payer: Medicare Other | Admitting: Gastroenterology

## 2013-06-08 ENCOUNTER — Encounter: Payer: Self-pay | Admitting: Internal Medicine

## 2013-06-08 ENCOUNTER — Ambulatory Visit (INDEPENDENT_AMBULATORY_CARE_PROVIDER_SITE_OTHER): Payer: Medicare Other | Admitting: Internal Medicine

## 2013-06-08 VITALS — BP 142/80 | HR 85 | Ht 71.0 in | Wt 179.0 lb

## 2013-06-08 DIAGNOSIS — I480 Paroxysmal atrial fibrillation: Secondary | ICD-10-CM

## 2013-06-08 DIAGNOSIS — I4891 Unspecified atrial fibrillation: Secondary | ICD-10-CM

## 2013-06-08 MED ORDER — METOPROLOL TARTRATE 25 MG PO TABS: ORAL_TABLET | ORAL | Status: DC

## 2013-06-08 NOTE — Progress Notes (Signed)
PCP: Criselda Peaches, MD Primary Cardiologist:  Dr Wynelle Beckmann is a 68 y.o. male who presents today for electrophysiology followup.  Since his last visit, the patient reports doing very well.  He has had no afib since June.   He is pleased with his current health state.  He had recent palpitations in the setting of a GI illness which has improved. Today, he denies symptoms of chest pain, shortness of breath,  lower extremity edema,  presyncope, or syncope.  The patient is otherwise without complaint today.   Past Medical History  Diagnosis Date  . GERD (gastroesophageal reflux disease)   . History of kidney stones 07/2006    x 1  . Paroxysmal atrial fibrillation     2008 and 20014, PVI 09/07/12  . Hyperlipidemia   . Normal coronary arteries 2008    Normal LV function  . Hypertension    Past Surgical History  Procedure Laterality Date  . Cardiac catheterization Left 07/20/2006    No significant CAD, EF 60%, continue medical therapy  . Tee without cardioversion N/A 09/06/2012    Procedure: TRANSESOPHAGEAL ECHOCARDIOGRAM (TEE);  Surgeon: Pixie Casino, MD;  Location: [facility];  Service: Cardiovascular;  Laterality: N/A;  . Atrial ablation surgery  09/07/12    PVI by Dr [name]    Current Outpatient Prescriptions  Medication Sig Dispense Refill  . diltiazem (TAZTIA XT) 240 MG 24 hr capsule Take 1 capsule (240 mg total) by mouth daily.  90 capsule  3  . metoprolol succinate (TOPROL-XL) 25 MG 24 hr tablet Take 25 mg by mouth daily as needed.      [name] omeprazole (PRILOSEC) 20 MG capsule Take 20 mg by mouth daily.      . Rivaroxaban (XARELTO) 20 MG TABS tablet Take 1 tablet (20 mg total) by mouth daily.  90 tablet  2  . rosuvastatin (CRESTOR) 10 MG tablet Take 1 tablet (10 mg total) by mouth every evening.  90 tablet  1   No current facility-administered medications for this visit.    Physical Exam: Filed Vitals:   06/08/13 0924  BP: 142/80  Pulse: 85  Height: 5\' 11"   (1.803 m)  Weight: 179 lb (81.194 kg)    GEN- The patient is well appearing, alert and oriented x 3 today.   Head- normocephalic, atraumatic Eyes-  Sclera clear, conjunctiva pink Ears- hearing intact Oropharynx- clear Lungs- Clear to ausculation bilaterally, normal work of breathing Heart- Regular rate and rhythm, no murmurs, rubs or gallops, PMI not laterally displaced GI- soft, NT, ND, + BS Extremities- no clubbing, cyanosis, or edema  ekg today reveals sinus bradycardia 85 bpm, PACs, incomplete RBBB  Assessment and Plan:  1. afib Doing well s/p ablation,    He will continue to take metoprolol as needed.  His CHADS2VASC score is 2 (age, HTN).  Continue xarelto.  Follow-up with Dr Gwenlyn Found as scheduled Return to see me in 6 months

## 2013-06-08 NOTE — Patient Instructions (Signed)
Your physician recommends that you schedule a follow-up appointment in: 6 months with Dr.Allred.  

## 2013-07-01 ENCOUNTER — Telehealth: Payer: Self-pay | Admitting: Gastroenterology

## 2013-07-01 NOTE — Telephone Encounter (Signed)
Pt was given prednisone to take for back pain and he wanted to make sure it was ok for him to take the med prior to his colon. Discussed with pt that it was fine for him to take the prednisone.

## 2013-07-06 ENCOUNTER — Telehealth: Payer: Self-pay | Admitting: *Deleted

## 2013-07-06 NOTE — Telephone Encounter (Signed)
ok 

## 2013-07-06 NOTE — Telephone Encounter (Signed)
Pt is scheduled for colonoscopy 07/07/13 at 830 am, Called pt to verify that he had instructions for prep, and that he knew to hold his xarelto since he has not been seen since 01/31/13, there was no evidence of any updated instructions being sent to pt,  pt states his instructions are for the same time as his old appointment and he was just going to follow those instructions also pt knew to hold xarelto, he did not take it today 07/06/13, pt has seen cardiologist since ov for palpitations but says he feels fine and cardiologist thinks his palpitations were from a stomach virus he had and his electrolytes were off. Pt states he feels fine today and has not had any problems since he had a virus, just wanted to advise of cardio issues since ov-adm

## 2013-07-07 ENCOUNTER — Ambulatory Visit (AMBULATORY_SURGERY_CENTER): Payer: Medicare Other | Admitting: Gastroenterology

## 2013-07-07 ENCOUNTER — Encounter: Payer: Self-pay | Admitting: Gastroenterology

## 2013-07-07 VITALS — BP 125/79 | HR 62 | Temp 97.9°F | Resp 15 | Ht 71.0 in | Wt 179.0 lb

## 2013-07-07 DIAGNOSIS — D126 Benign neoplasm of colon, unspecified: Secondary | ICD-10-CM

## 2013-07-07 DIAGNOSIS — K5730 Diverticulosis of large intestine without perforation or abscess without bleeding: Secondary | ICD-10-CM

## 2013-07-07 DIAGNOSIS — Z86010 Personal history of colonic polyps: Secondary | ICD-10-CM

## 2013-07-07 MED ORDER — SODIUM CHLORIDE 0.9 % IV SOLN: 500.0000 mL | INTRAVENOUS | Status: DC

## 2013-07-07 NOTE — Op Note (Signed)
[facility]  [address] [place], [id number]   COLONOSCOPY PROCEDURE REPORT  PATIENT: Luis [name], Luis [name]  MR#: [id number] BIRTHDATE: [birth year] , 67  yrs. old GENDER: Male ENDOSCOPIST: Inda Castle, MD REFERRED HF:WYOVZ Nyoka Cowden, M.D. PROCEDURE DATE:  07/07/2013 PROCEDURE:   Colonoscopy with snare polypectomy and Colonoscopy with cold biopsy polypectomy First Screening Colonoscopy - Avg.  risk and is 50 yrs.  old or older - No.  Prior Negative Screening - Now for repeat screening. 10 or more years since last screening  History of Adenoma - Now for follow-up colonoscopy & has been > or = to 3 yrs.  N/A  Polyps Removed Today? Yes. ASA CLASS:   Class II INDICATIONS:Average risk patient for colon cancer. MEDICATIONS: MAC sedation, administered by CRNA and Propofol (Diprivan) 330 mg IV  DESCRIPTION OF PROCEDURE:   After the risks benefits and alternatives of the procedure were thoroughly explained, informed consent was obtained.  A digital rectal exam revealed no abnormalities of the rectum.   The LB [id number]  endoscope was introduced through the anus and advanced to the terminal ileum which was intubated for a short distance. No adverse events experienced.   The quality of the prep was excellent using Suprep The instrument was then slowly withdrawn as the colon was fully examined.      COLON FINDINGS: Two sessile polyps measuring 2 mm in size were found at the cecum.  A polypectomy was performed with cold forceps. Three sessile polyps were found in the ascending colon measuring 5, 10 and 13 mm, respectively..  A polypectomy was performed using hotsnare cauteryon the largest polyp.  The other 2 polyps were removed with cold polypectomy snare..  The resection was complete and the polyp tissue was completely retrieved.   A sessile polyp measuring 13 mm in size was found in the proximal sigmoid colon.  A polypectomy was performed using snare cautery.   The resection was complete and the polyp tissue was completely retrieved.   A sessile polyp was found in the rectum.  A polypectomy was performed.  The resection was complete and the polyp tissue was completely retrieved.   There was severe diverticulosis noted in the sigmoid colon with associated luminal narrowing and colonic spasm. Hemorrhoids were found.  Retroflexed views revealed no abnormalities. The time to cecum=5 minutes 16 seconds.  Withdrawal time=19 minutes 37 seconds.  The scope was withdrawn and the procedure completed. COMPLICATIONS: There were no complications.  ENDOSCOPIC IMPRESSION: 1.   Two sessile polyps measuring 2 mm in size were found at the cecum; polypectomy was performed with cold forceps 2.   Three sessile polyps were found in the ascending colon; polypectomy was performed using snare cautery 3.   Sessile polyp measuring 13 mm in size was found in the proximal sigmoid colon; polypectomy was performed using snare cautery 4.   Sessile polyp was found in the rectum; polypectomy was performed 5.   There was severe diverticulosis noted in the sigmoid colon 6.   Hemorrhoids  RECOMMENDATIONS: Colonoscopy 3 years pending pathology   eSigned:  Inda Castle, MD 07/07/2013 9:32 AM   cc:   PATIENT NAME:  Luis [name], Luis [name] MR#: [id number]

## 2013-07-07 NOTE — Progress Notes (Signed)
Report to pacu rn, vss, bbs=clear 

## 2013-07-07 NOTE — Progress Notes (Signed)
Called to room to assist during endoscopic procedure.  Patient ID and intended procedure confirmed with present staff. Received instructions for my participation in the procedure from the performing physician.  

## 2013-07-07 NOTE — Patient Instructions (Addendum)
Resume xarelto in 3 days.  Discharge instructions given with verbal understanding. Handouts on polyps,diverticulosis and hemorrhoids. Resume previous medications. YOU HAD AN ENDOSCOPIC PROCEDURE TODAY AT [facility]: Refer to the procedure report that was given to you for any specific questions about what was found during the examination.  If the procedure report does not answer your questions, please call your gastroenterologist to clarify.  If you requested that your care partner not be given the details of your procedure findings, then the procedure report has been included in a sealed envelope for you to review at your convenience later.  YOU SHOULD EXPECT: Some feelings of bloating in the abdomen. Passage of more gas than usual.  Walking can help get rid of the air that was put into your GI tract during the procedure and reduce the bloating. If you had a lower endoscopy (such as a colonoscopy or flexible sigmoidoscopy) you may notice spotting of blood in your stool or on the toilet paper. If you underwent a bowel prep for your procedure, then you may not have a normal bowel movement for a few days.  DIET: Your first meal following the procedure should be a light meal and then it is ok to progress to your normal diet.  A half-sandwich or bowl of soup is an example of a good first meal.  Heavy or fried foods are harder to digest and may make you feel nauseous or bloated.  Likewise meals heavy in dairy and vegetables can cause extra gas to form and this can also increase the bloating.  Drink plenty of fluids but you should avoid alcoholic beverages for 24 hours.  ACTIVITY: Your care partner should take you home directly after the procedure.  You should plan to take it easy, moving slowly for the rest of the day.  You can resume normal activity the day after the procedure however you should NOT DRIVE or use heavy machinery for 24 hours (because of the sedation medicines used during the  test).    SYMPTOMS TO REPORT IMMEDIATELY: A gastroenterologist can be reached at any hour.  During normal business hours, 8:30 AM to 5:00 PM Monday through Friday, call [phone].  After hours and on weekends, please call the GI answering service at [phone] who will take a message and have the physician on call contact you.   Following lower endoscopy (colonoscopy or flexible sigmoidoscopy):  Excessive amounts of blood in the stool  Significant tenderness or worsening of abdominal pains  Swelling of the abdomen that is new, acute  Fever of 100F or higher  FOLLOW UP: If any biopsies were taken you will be contacted by phone or by letter within the next 1-3 weeks.  Call your gastroenterologist if you have not heard about the biopsies in 3 weeks.  Our staff will call the home number listed on your records the next business day following your procedure to check on you and address any questions or concerns that you may have at that time regarding the information given to you following your procedure. This is a courtesy call and so if there is no answer at the home number and we have not heard from you through the emergency physician on call, we will assume that you have returned to your regular daily activities without incident.  SIGNATURES/CONFIDENTIALITY: You and/or your care partner have signed paperwork which will be entered into your electronic medical record.  These signatures attest to the fact that that the information  above on your After Visit Summary has been reviewed and is understood.  Full responsibility of the confidentiality of this discharge information lies with you and/or your care-partner. 

## 2013-07-11 ENCOUNTER — Telehealth: Payer: Self-pay | Admitting: *Deleted

## 2013-07-11 NOTE — Telephone Encounter (Signed)
  Follow up Call-  Call back number 07/07/2013 01/07/2013  Post procedure Call Back phone  # [phone]  Permission to leave phone message Yes Yes     Patient questions:  Do you have a fever, pain , or abdominal swelling? no Pain Score  0 *  Have you tolerated food without any problems? yes  Have you been able to return to your normal activities? yes  Do you have any questions about your discharge instructions: Diet   no Medications  no Follow up visit  no  Do you have questions or concerns about your Care? no  Actions: * If pain score is 4 or above: No action needed, pain <4.

## 2013-07-15 ENCOUNTER — Encounter: Payer: Self-pay | Admitting: Gastroenterology

## 2013-07-21 ENCOUNTER — Telehealth: Payer: Self-pay | Admitting: Cardiovascular Disease

## 2013-07-28 NOTE — Telephone Encounter (Signed)
Closed encounter °

## 2013-09-05 ENCOUNTER — Telehealth: Payer: Self-pay | Admitting: Cardiovascular Disease

## 2013-09-09 NOTE — Telephone Encounter (Signed)
Closed encounter °

## 2013-09-14 ENCOUNTER — Telehealth: Payer: Self-pay | Admitting: Cardiology

## 2013-09-14 NOTE — Telephone Encounter (Signed)
Pt. to see Edmonia Caprio

## 2013-09-14 NOTE — Telephone Encounter (Signed)
Pt says he feel dizzy and lightheaded,need to probably see somebody asap please.

## 2013-09-15 ENCOUNTER — Ambulatory Visit (INDEPENDENT_AMBULATORY_CARE_PROVIDER_SITE_OTHER): Payer: Medicare Other | Admitting: Cardiology

## 2013-09-15 ENCOUNTER — Encounter: Payer: Self-pay | Admitting: Cardiology

## 2013-09-15 VITALS — BP 144/66 | HR 108 | Ht 71.0 in | Wt 177.2 lb

## 2013-09-15 DIAGNOSIS — I4891 Unspecified atrial fibrillation: Secondary | ICD-10-CM

## 2013-09-15 DIAGNOSIS — I480 Paroxysmal atrial fibrillation: Secondary | ICD-10-CM

## 2013-09-15 DIAGNOSIS — R42 Dizziness and giddiness: Secondary | ICD-10-CM

## 2013-09-15 DIAGNOSIS — Z0389 Encounter for observation for other suspected diseases and conditions ruled out: Secondary | ICD-10-CM

## 2013-09-15 DIAGNOSIS — R002 Palpitations: Secondary | ICD-10-CM

## 2013-09-15 DIAGNOSIS — I10 Essential (primary) hypertension: Secondary | ICD-10-CM

## 2013-09-15 DIAGNOSIS — IMO0001 Reserved for inherently not codable concepts without codable children: Secondary | ICD-10-CM

## 2013-09-15 DIAGNOSIS — Z79899 Other long term (current) drug therapy: Secondary | ICD-10-CM

## 2013-09-15 LAB — CBC
HCT: 44 % (ref 39.0–52.0)
Hemoglobin: 15.7 g/dL (ref 13.0–17.0)
MCH: 32.5 pg (ref 26.0–34.0)
MCHC: 35.7 g/dL (ref 30.0–36.0)
MCV: 91.1 fL (ref 78.0–100.0)
Platelets: 168 10*3/uL (ref 150–400)
RBC: 4.83 MIL/uL (ref 4.22–5.81)
RDW: 13.6 % (ref 11.5–15.5)
WBC: 5.9 10*3/uL (ref 4.0–10.5)

## 2013-09-15 MED ORDER — METOPROLOL TARTRATE 25 MG PO TABS: 25.0000 mg | ORAL_TABLET | Freq: Two times a day (BID) | ORAL | Status: DC

## 2013-09-15 MED ORDER — DILTIAZEM HCL ER COATED BEADS 180 MG PO CP24: 180.0000 mg | ORAL_CAPSULE | Freq: Every day | ORAL | Status: DC

## 2013-09-15 NOTE — Progress Notes (Signed)
09/15/2013 Luis [name]   February 12, 1946  [id number]  Primary Physicia GREEN, Luis Bachelor, MD Primary Cardiologist: Dr Luis [name]  HPI:  68 y/o followed by Dr Luis [name] with a history of normal coronaries in 2008 and PAF. He had successful RFA June 2014 and has done well since. He saw Dr Luis [name] in March 2015. He had some palpitations after a GI illness this sprinfg and took Metoprolol for a couple of days and his symptoms improved.            Yesterday he was golfing and became dizzy. He says he was dizzy even before he got to the course. He did not feel his heart racing. Today he felt better. In the office his HR was 108. He was not orthostatic.    Current Outpatient Prescriptions  Medication Sig Dispense Refill  . HYDROcodone-acetaminophen (NORCO/VICODIN) 5-325 MG per tablet as needed.      . Ibuprofen (ADVIL PO) Take by mouth as needed.      . metoprolol tartrate (LOPRESSOR) 25 MG tablet Take 1 tablet (25 mg total) by mouth 2 (two) times daily.  60 tablet  0  . omeprazole (PRILOSEC) 20 MG capsule Take 20 mg by mouth daily.      . Rivaroxaban (XARELTO) 20 MG TABS tablet Take 1 tablet (20 mg total) by mouth daily.  90 tablet  2  . rosuvastatin (CRESTOR) 10 MG tablet Take 1 tablet (10 mg total) by mouth every evening.  90 tablet  1  . diltiazem (CARDIZEM CD) 180 MG 24 hr capsule Take 1 capsule (180 mg total) by mouth daily.  30 capsule  0   No current facility-administered medications for this visit.    Allergies  Allergen Reactions  . Lopressor [Metoprolol]     Fatigue    History   Social History  . Marital Status: Married    Spouse Name: N/A    Number of Children: N/A  . Years of Education: N/A   Occupational History  . Not on file.   Social History Main Topics  . Smoking status: Former Smoker -- 35 years    Types: Cigarettes    Quit date: 11/27/1994  . Smokeless tobacco: Never Used  . Alcohol Use: 3.5 oz/week    7 drink(s) per week     Comment: 3-4 oz daily  . Drug Use:  No  . Sexual Activity: Not on file   Other Topics Concern  . Not on file   Social History Narrative   Lives in [place] with spouse.   Retired Electrical engineer     Review of Systems: General: negative for chills, fever, night sweats or weight changes.  Cardiovascular: negative for chest pain, dyspnea on exertion, edema, orthopnea, palpitations, paroxysmal nocturnal dyspnea or shortness of breath Dermatological: negative for rash Respiratory: negative for cough or wheezing Urologic: negative for hematuria Abdominal: negative for nausea, vomiting, diarrhea, bright red blood per rectum, melena, or hematemesis Neurologic: negative for visual changes, syncope, or dizziness All other systems reviewed and are otherwise negative except as noted above.    Blood pressure 144/66, pulse 108, height 5\' 11"  (1.803 m), weight 177 lb 3.2 oz (80.377 kg).  General appearance: alert, cooperative and no distress Neck: no carotid bruit and no JVD Lungs: clear to auscultation bilaterally Heart: regular rate and rhythm Extremities: no edema  EKG NST, ST  ASSESSMENT AND PLAN:   Dizziness He had dizziness yesterday on the golf course  HYPERTENSION Controlled, not orthostatic today in the  office  Normal coronary arteries- (40-50%bDx1) 2008 No chest pain  Paroxysmal atrial fibrillation- RFA June 2014 Some palpitations   PLAN  I cut his Diltiazem back to 180 mg and added Metoprolol 25 mg BID. The pt thinks his symptoms are from Diltiazem. I did order a 48 hr monitor- he did not want to wear a 2 week Holter, and a CBC and BMP. He has been taking lots of NSAIDs lately.  I'll see him back next week.   Luis [name] KPA-C 09/15/2013 11:48 AM

## 2013-09-15 NOTE — Assessment & Plan Note (Signed)
Some palpitations

## 2013-09-15 NOTE — Patient Instructions (Addendum)
Your physician recommends that you schedule a follow-up appointment in: [place] has recommended that you wear a holter monitor. Holter monitors are medical devices that record the heart's electrical activity. Doctors most often use these monitors to diagnose arrhythmias. Arrhythmias are problems with the speed or rhythm of the heartbeat. The monitor is a small, portable device. You can wear one while you do your normal daily activities. This is usually used to diagnose what is causing palpitations/syncope (passing out). 61 Hours  Your physician recommends that you return for lab work BMP, CBC  Your physician has recommended you make the following change in your medication: Decrease Diltiazem to 180 mg and take Metoprolol 25 mg twice a day

## 2013-09-15 NOTE — Assessment & Plan Note (Signed)
No chest pain

## 2013-09-15 NOTE — Assessment & Plan Note (Signed)
Controlled, not orthostatic today in the office

## 2013-09-15 NOTE — Assessment & Plan Note (Signed)
He had dizziness yesterday on the golf course

## 2013-09-16 LAB — BASIC METABOLIC PANEL
BUN: 14 mg/dL (ref 6–23)
CO2: 28 mEq/L (ref 19–32)
Calcium: 9.7 mg/dL (ref 8.4–10.5)
Chloride: 104 mEq/L (ref 96–112)
Creat: 1.07 mg/dL (ref 0.50–1.35)
Glucose, Bld: 92 mg/dL (ref 70–99)
Potassium: 4.3 mEq/L (ref 3.5–5.3)
Sodium: 140 mEq/L (ref 135–145)

## 2013-09-21 ENCOUNTER — Other Ambulatory Visit: Payer: Self-pay | Admitting: *Deleted

## 2013-09-21 DIAGNOSIS — IMO0001 Reserved for inherently not codable concepts without codable children: Secondary | ICD-10-CM

## 2013-09-21 DIAGNOSIS — I10 Essential (primary) hypertension: Secondary | ICD-10-CM

## 2013-09-21 DIAGNOSIS — Z0389 Encounter for observation for other suspected diseases and conditions ruled out: Secondary | ICD-10-CM

## 2013-09-21 DIAGNOSIS — I480 Paroxysmal atrial fibrillation: Secondary | ICD-10-CM

## 2013-09-22 ENCOUNTER — Ambulatory Visit: Payer: Medicare Other | Admitting: Cardiology

## 2013-09-22 ENCOUNTER — Encounter: Payer: Self-pay | Admitting: Cardiology

## 2013-09-22 ENCOUNTER — Ambulatory Visit (INDEPENDENT_AMBULATORY_CARE_PROVIDER_SITE_OTHER): Payer: Medicare Other | Admitting: Cardiology

## 2013-09-22 VITALS — BP 143/78 | HR 66 | Ht 71.0 in | Wt 177.0 lb

## 2013-09-22 DIAGNOSIS — Z0389 Encounter for observation for other suspected diseases and conditions ruled out: Secondary | ICD-10-CM

## 2013-09-22 DIAGNOSIS — I4891 Unspecified atrial fibrillation: Secondary | ICD-10-CM

## 2013-09-22 DIAGNOSIS — IMO0001 Reserved for inherently not codable concepts without codable children: Secondary | ICD-10-CM

## 2013-09-22 DIAGNOSIS — I480 Paroxysmal atrial fibrillation: Secondary | ICD-10-CM

## 2013-09-22 DIAGNOSIS — Z7901 Long term (current) use of anticoagulants: Secondary | ICD-10-CM

## 2013-09-22 NOTE — Assessment & Plan Note (Signed)
Xarelto

## 2013-09-22 NOTE — Patient Instructions (Signed)
Kerin Ransom, PA-C has recommended making the following medication changes:  STOP Diltiazem  Please call next week to let Lurena Joiner know if your blood pressure runs high.  Lurena Joiner wants you to follow-up in 6 months. You will receive a reminder letter in the mail one months in advance. If you don't receive a letter, please call our office to schedule the follow-up appointment.

## 2013-09-22 NOTE — Progress Notes (Signed)
09/22/2013 Luis [name]   03/10/1946  [id number]  Primary Physicia GREEN, [name], MD Primary Cardiologist: Dr Gwenlyn Found  HPI:  68 y/o followed by Dr Gwenlyn Found with a history of normal coronaries in 2008 and PAF. He had successful RFA June 2014 and has done well since. He saw Dr [name] in March 2015. He had some palpitations after a GI illness this spring and took Metoprolol for a couple of days and his symptoms improved.            I saw him in the office 09/15/13 with complaints of fatigue and dizzy ness which he attributed to Diltiazem. He was tachycardic that day with a HR of 108.I suggested he start taking Metoprolol 25 mg BID and decrease his Diltiazem to 180 mg daily. I also ordered labs and a 48 hr Holter. He is here today for follow up. His Holter and labs were WNL. His HR is better- it was 100 last week, now 60. He stills like he is "in a fog" after he takes his Diltiazem. I suggested he stop his Diltiazem and see if he feels better. He is to continue Lopressor 25 mg BID. He'll let me know what his B/P is next week, he may need an ACE added.        Current Outpatient Prescriptions  Medication Sig Dispense Refill  . diltiazem (CARDIZEM CD) 180 MG 24 hr capsule Take 1 capsule (180 mg total) by mouth daily.  30 capsule  0  . HYDROcodone-acetaminophen (NORCO/VICODIN) 5-325 MG per tablet as needed.      . Ibuprofen (ADVIL PO) Take by mouth as needed.      . metoprolol tartrate (LOPRESSOR) 25 MG tablet Take 1 tablet (25 mg total) by mouth 2 (two) times daily.  60 tablet  0  . omeprazole (PRILOSEC) 20 MG capsule Take 20 mg by mouth daily.      . Rivaroxaban (XARELTO) 20 MG TABS tablet Take 1 tablet (20 mg total) by mouth daily.  90 tablet  2  . rosuvastatin (CRESTOR) 10 MG tablet Take 1 tablet (10 mg total) by mouth every evening.  90 tablet  1   No current facility-administered medications for this visit.    Allergies  Allergen Reactions  . Lopressor [Metoprolol]     Fatigue     History   Social History  . Marital Status: Married    Spouse Name: N/A    Number of Children: N/A  . Years of Education: N/A   Occupational History  . Not on file.   Social History Main Topics  . Smoking status: Former Smoker -- 35 years    Types: Cigarettes    Quit date: 11/27/1994  . Smokeless tobacco: Never Used  . Alcohol Use: 3.5 oz/week    7 drink(s) per week     Comment: 3-4 oz daily  . Drug Use: No  . Sexual Activity: Not on file   Other Topics Concern  . Not on file   Social History Narrative   Lives in [place] with spouse.   Retired Electrical engineer     Review of Systems: General: negative for chills, fever, night sweats or weight changes.  Cardiovascular: negative for chest pain, dyspnea on exertion, edema, orthopnea, palpitations, paroxysmal nocturnal dyspnea or shortness of breath Dermatological: negative for rash Respiratory: negative for cough or wheezing Urologic: negative for hematuria Abdominal: negative for nausea, vomiting, diarrhea, bright red blood per rectum, melena, or hematemesis Neurologic: negative for visual changes,  syncope, or dizziness All other systems reviewed and are otherwise negative except as noted above.    Blood pressure 143/78, pulse 66, height 5\' 11"  (1.803 m), weight 177 lb (80.287 kg).  General appearance: alert, cooperative and no distress Lungs: clear to auscultation bilaterally Heart: regular rate and rhythm   ASSESSMENT AND PLAN:   Paroxysmal atrial fibrillation- RFA June 2014 He has been having symptoms of fatigue he attributes to Diltiazem  Chronic anticoagulation Xarelto  Normal coronary arteries- (40-50%bDx1) 2008 .   PLAN  Stop Diltiazem, continue Lopressor, call next week with B/P readings  KILROY,LUKE KPA-C 09/22/2013 1:02 PM

## 2013-09-22 NOTE — Assessment & Plan Note (Signed)
He has been having symptoms of fatigue he attributes to Diltiazem

## 2013-11-01 ENCOUNTER — Other Ambulatory Visit: Payer: Self-pay | Admitting: *Deleted

## 2013-11-01 ENCOUNTER — Telehealth: Payer: Self-pay | Admitting: Cardiovascular Disease

## 2013-11-01 DIAGNOSIS — E782 Mixed hyperlipidemia: Secondary | ICD-10-CM

## 2013-11-01 MED ORDER — METOPROLOL SUCCINATE ER 50 MG PO TB24: 50.0000 mg | ORAL_TABLET | Freq: Every day | ORAL | Status: DC

## 2013-11-01 NOTE — Telephone Encounter (Signed)
I have talked to this pt. And discussed this with Kerin Ransom and received orders to change metoprolol tart 25 mg bid to Toprol XL 50 mg daily and to stop taking Crestor and we will check pts. Lipids in three months ; pt stated understanding of instructions

## 2013-11-01 NOTE — Telephone Encounter (Signed)
Please call,he wants to talk to you about his Crestor and Metoprolol.

## 2013-11-14 ENCOUNTER — Telehealth: Payer: Self-pay | Admitting: Cardiovascular Disease

## 2013-11-14 NOTE — Telephone Encounter (Signed)
Spoke with pt, he is having trouble with the metoprolol. After the morning dosage he becomes very tired and seems fuzzy headed. His bp has been running 125-130/75. He would like to try taking 1/2 tablet of metoprolol twice daily to see if that helps with his symptoms. He has tried the ER metoprolol and was not able to tolerate. He will monitor his bp and pulse and let us know if his symptoms do not improve.

## 2013-11-14 NOTE — Telephone Encounter (Signed)
Calling because he is having some trouble taking the metoprolol 25mg  twice a day and wants to speak to someone about it .[name] Please Call    Thanks

## 2013-12-01 ENCOUNTER — Other Ambulatory Visit: Payer: Self-pay

## 2013-12-05 ENCOUNTER — Encounter: Payer: Self-pay | Admitting: Internal Medicine

## 2013-12-05 ENCOUNTER — Ambulatory Visit (INDEPENDENT_AMBULATORY_CARE_PROVIDER_SITE_OTHER): Payer: Medicare Other | Admitting: Internal Medicine

## 2013-12-05 VITALS — BP 138/80 | HR 79 | Ht 71.0 in | Wt 177.8 lb

## 2013-12-05 DIAGNOSIS — I700 Atherosclerosis of aorta: Secondary | ICD-10-CM

## 2013-12-05 DIAGNOSIS — I4891 Unspecified atrial fibrillation: Secondary | ICD-10-CM

## 2013-12-05 DIAGNOSIS — I480 Paroxysmal atrial fibrillation: Secondary | ICD-10-CM

## 2013-12-05 NOTE — Addendum Note (Signed)
Addended by: Janan Halter F on: 12/05/2013 11:04 AM   Modules accepted: Orders

## 2013-12-05 NOTE — Progress Notes (Signed)
PCP: Criselda Peaches, MD Primary Cardiologist:  Dr Wynelle Beckmann is a 68 y.o. male who presents today for electrophysiology followup.  Since his last visit, the patient reports doing very well.  He has had no afib.   He is pleased with his current health state.  Today, he denies symptoms of chest pain, shortness of breath,  lower extremity edema,  presyncope, or syncope.  The patient is otherwise without complaint today.   Past Medical History  Diagnosis Date  . GERD (gastroesophageal reflux disease)   . History of kidney stones 07/2006    x 1  . Paroxysmal atrial fibrillation     2008 and 20014, PVI 09/07/12  . Hyperlipidemia   . Normal coronary arteries 2008    Normal LV function  . Hypertension   . Allergy     SEASONAL  . Arthritis     BACK AND KNEES   Past Surgical History  Procedure Laterality Date  . Cardiac catheterization Left 07/20/2006    No significant CAD, EF 60%, continue medical therapy  . Tee without cardioversion N/A 09/06/2012    Procedure: TRANSESOPHAGEAL ECHOCARDIOGRAM (TEE);  Surgeon: Pixie Casino, MD;  Location: [facility] ENDOSCOPY;  Service: Cardiovascular;  Laterality: N/A;  . Atrial ablation surgery  09/07/12    PVI by Dr [name]  . Colonoscopy    . Knee arthroscopy    . Upper gastrointestinal endoscopy      Current Outpatient Prescriptions  Medication Sig Dispense Refill  . HYDROcodone-acetaminophen (NORCO/VICODIN) 5-325 MG per tablet Take 1 tablet by mouth every 6 (six) hours as needed for moderate pain.       . metoprolol tartrate (LOPRESSOR) 25 MG tablet Take 0.5 tablets by mouth 2 (two) times daily.      [name] omeprazole (PRILOSEC) 20 MG capsule Take 20 mg by mouth daily.      . Rivaroxaban (XARELTO) 20 MG TABS tablet Take 1 tablet (20 mg total) by mouth daily.  90 tablet  2   No current facility-administered medications for this visit.    Physical Exam: Filed Vitals:   12/05/13 0945  BP: 138/80  Pulse: 79  Height: 5\' 11"  (1.803 m)  Weight:  177 lb 12.8 oz (80.65 kg)    GEN- The patient is well appearing, alert and oriented x 3 today.   Head- normocephalic, atraumatic Eyes-  Sclera clear, conjunctiva pink Ears- hearing intact Oropharynx- clear Lungs- Clear to ausculation bilaterally, normal work of breathing Heart- Regular rate and rhythm, no murmurs, rubs or gallops, PMI not laterally displaced GI- soft, NT, ND, + BS Extremities- no clubbing, cyanosis, or edema  ekg today reveals sinus bradycardia 85 bpm, PACs, incomplete RBBB  Assessment and Plan:  1. afib Doing well s/p ablation,    He will continue to take metoprolol as needed.  His CHADS2VASC score is 2 (age, HTN).  Continue xarelto.  2. Aortic calcificiation Seen on plain films by orthopaedist.  Given age, prior tobacco, and calcification of aorta, will order an abdominal US to evaluate for aortic disease  Follow-up with Dr Gwenlyn Found as scheduled Return to see me in 28 months

## 2013-12-05 NOTE — Patient Instructions (Signed)
Your physician wants you to follow-up in: 12 months with Dr Vallery Ridge will receive a reminder letter in the mail two months in advance. If you don't receive a letter, please call our office to schedule the follow-up appointment.  Your physician has requested that you have an abdominal aorta duplex. During this test, an ultrasound is used to evaluate the aorta. Allow 30 minutes for this exam. Do not eat after midnight the day before and avoid carbonated beverages

## 2013-12-08 ENCOUNTER — Ambulatory Visit (HOSPITAL_COMMUNITY): Payer: Medicare Other | Attending: Internal Medicine | Admitting: Cardiology

## 2013-12-08 DIAGNOSIS — Z87891 Personal history of nicotine dependence: Secondary | ICD-10-CM | POA: Insufficient documentation

## 2013-12-08 DIAGNOSIS — I700 Atherosclerosis of aorta: Secondary | ICD-10-CM | POA: Insufficient documentation

## 2013-12-08 DIAGNOSIS — I10 Essential (primary) hypertension: Secondary | ICD-10-CM | POA: Diagnosis not present

## 2013-12-08 DIAGNOSIS — N200 Calculus of kidney: Secondary | ICD-10-CM | POA: Diagnosis not present

## 2013-12-08 DIAGNOSIS — E785 Hyperlipidemia, unspecified: Secondary | ICD-10-CM | POA: Diagnosis not present

## 2013-12-08 NOTE — Progress Notes (Signed)
Duplex abdominal aorta/iliac arteries performed

## 2014-01-19 LAB — LIPID PANEL
Cholesterol: 191 mg/dL (ref 0–200)
HDL: 49 mg/dL (ref >39)
LDL Cholesterol: 125 mg/dL — ABNORMAL HIGH (ref 0–99)
Total CHOL/HDL Ratio: 3.9 Ratio
Triglycerides: 85 mg/dL (ref <150)
VLDL: 17 mg/dL (ref 0–40)

## 2014-01-23 ENCOUNTER — Telehealth: Payer: Self-pay | Admitting: Cardiology

## 2014-01-23 DIAGNOSIS — E785 Hyperlipidemia, unspecified: Secondary | ICD-10-CM

## 2014-01-23 NOTE — Telephone Encounter (Signed)
Spoke with patient and advised him to recheck lipids in 6 months - after working on diet and exercise. Patient agreeable. He stopped crestor r/t leg cramps at night and cost.  Lipid ordered and lab slips mailed to patient.

## 2014-01-23 NOTE — Telephone Encounter (Signed)
His LDL is a little higher than recommended at 125- goal is 100 or less. He does have normal coronaries so I'm not that concerned. I would suggest diet, exercise and re check fasting lipids in 6 months. Why did he stop Crestor- ? Side effects?  Kerin Ransom PA-C 01/23/2014 2:54 PM

## 2014-01-23 NOTE — Telephone Encounter (Signed)
Pt called in stating that he was recently taken off of Crestor and had some labs drawn and he would like for [facility] to take a look at the labs to see if he needs to go back on to Crestor or not. Please call  Thanks

## 2014-01-23 NOTE — Telephone Encounter (Signed)
Patient states has been off crestor for 4 months (triage call from 11/01/13 shows documentation when patient was instructed to stop crestor). He had a lipid profile done on 10/15. Previous labs from July scanned into [facility] for comparison. He would like to know what to do about crestor/medication management if necessary   Will defer to [place], [place]

## 2014-02-08 ENCOUNTER — Telehealth: Payer: Self-pay | Admitting: *Deleted

## 2014-02-08 NOTE — Telephone Encounter (Signed)
Xarelto samples placed at the front desk for pick up.

## 2014-03-13 ENCOUNTER — Other Ambulatory Visit: Payer: Self-pay | Admitting: Cardiovascular Disease

## 2014-03-13 MED ORDER — RIVAROXABAN 20 MG PO TABS: 20.0000 mg | ORAL_TABLET | Freq: Every day | ORAL | Status: DC

## 2014-03-13 NOTE — Telephone Encounter (Signed)
Samples left at front desk, pt called and informed.

## 2014-03-13 NOTE — Telephone Encounter (Signed)
Pt need some samples of Xarelto 20 mg please.

## 2014-03-16 ENCOUNTER — Encounter (HOSPITAL_COMMUNITY): Payer: Self-pay | Admitting: Internal Medicine

## 2014-04-05 ENCOUNTER — Telehealth: Payer: Self-pay

## 2014-04-05 ENCOUNTER — Other Ambulatory Visit: Payer: Self-pay | Admitting: Nurse Practitioner

## 2014-04-05 MED ORDER — RIVAROXABAN 20 MG PO TABS: 20.0000 mg | ORAL_TABLET | Freq: Every day | ORAL | Status: DC

## 2014-04-05 NOTE — Telephone Encounter (Signed)
Placed samples of xarelto and a rx for xarelto at the front desk

## 2014-05-16 ENCOUNTER — Ambulatory Visit: Payer: Medicare Other | Admitting: Cardiovascular Disease

## 2014-06-12 ENCOUNTER — Encounter: Payer: Self-pay | Admitting: Cardiovascular Disease

## 2014-06-12 ENCOUNTER — Ambulatory Visit (INDEPENDENT_AMBULATORY_CARE_PROVIDER_SITE_OTHER): Payer: PPO | Admitting: Cardiovascular Disease

## 2014-06-12 VITALS — BP 116/78 | HR 71 | Ht 71.0 in | Wt 179.6 lb

## 2014-06-12 DIAGNOSIS — I480 Paroxysmal atrial fibrillation: Secondary | ICD-10-CM

## 2014-06-12 DIAGNOSIS — I10 Essential (primary) hypertension: Secondary | ICD-10-CM

## 2014-06-12 NOTE — Progress Notes (Signed)
06/12/2014 Luis [name]   09-18-1945  [id number]  Primary Physician GREEN, Luis Bachelor, MD Primary Cardiologist: Luis Harp MD Luis [name]   HPI: The patient is a very pleasant 69 year old, thin and fit-appearing, married Caucasian male, father of 51, grandfather to 2 grandchildren who I last saw 15 months ago. He has a history of paroxysmal atrial fibrillation maintaining sinus rhythm when I saw him in December. He has been evaluated by Dr. Argie [name] at [facility] remotely back in May 2008 and was cath'd by Dr. Einar [name] the month before revealing noncritical CAD with normal LV function. He does have a 35-pack-year history of tobacco abuse having quit August 1996. His other problems include history of hypertension, mild hyperlipidemia and family history of heart disease with a father who had an MI at age 25 and a brother who had stent placed at age 57. He did get occasional palpitations when I saw him back in December. He has had 2 episodes of symptomatic A-fib, the last of which occurred on June 19, 2012, which resulted in presyncope. He noticed this while he was playing golf. When he presented to the ER his heart rate was 144. He was placed on IV diltiazem and ultimately converted to sinus rhythm and was discharged home on flecainide 50 mg p.o. b.i.d. Because his CHADS2 score was 1 it was elected to keep him on aspirin.he underwent atrial fibrillation ablation by Dr. Thompson [name] on 09/07/12 with an excellent clinical result. He's had no recurrent episodes appear PAF. His Flecainide was discontinued. He is on Xarelto oral anticoagulation. He denies chest pain or shortness of breath. He had an abdominal duplex that showed no evidence of aneurysm. He did stop his statin drug and had mildly elevated LDL and last checked in October (125), which will be rechecked next month.   Current Outpatient Prescriptions  Medication Sig Dispense Refill  . metoprolol tartrate (LOPRESSOR) 25 MG tablet  Take 0.5 tablets by mouth 2 (two) times daily.    [name] omeprazole (PRILOSEC) 20 MG capsule Take 20 mg by mouth daily.    . rivaroxaban (XARELTO) 20 MG TABS tablet Take 1 tablet (20 mg total) by mouth daily. 90 tablet 3   No current facility-administered medications for this visit.    No Known Allergies  History   Social History  . Marital Status: Married    Spouse Name: N/A  . Number of Children: N/A  . Years of Education: N/A   Occupational History  . Not on file.   Social History Main Topics  . Smoking status: Former Smoker -- 35 years    Types: Cigarettes    Quit date: 11/27/1994  . Smokeless tobacco: Never Used  . Alcohol Use: 3.5 oz/week    7 drink(s) per week     Comment: 3-4 oz daily  . Drug Use: No  . Sexual Activity: Not on file   Other Topics Concern  . Not on file   Social History Narrative   Lives in [place] with spouse.   Retired Electrical engineer     Review of Systems: General: negative for chills, fever, night sweats or weight changes.  Cardiovascular: negative for chest pain, dyspnea on exertion, edema, orthopnea, palpitations, paroxysmal nocturnal dyspnea or shortness of breath Dermatological: negative for rash Respiratory: negative for cough or wheezing Urologic: negative for hematuria Abdominal: negative for nausea, vomiting, diarrhea, bright red blood per rectum, melena, or hematemesis Neurologic: negative for visual changes, syncope, or dizziness All other systems  reviewed and are otherwise negative except as noted above.    Blood pressure 116/78, pulse 71, height 5\' 11"  (1.803 m), weight 179 lb 9.6 oz (81.466 kg).  General appearance: alert and no distress Neck: no adenopathy, no carotid bruit, no JVD, supple, symmetrical, trachea midline and thyroid not enlarged, symmetric, no tenderness/mass/nodules Lungs: clear to auscultation bilaterally Heart: regular rate and rhythm, S1, S2 normal, no murmur, click, rub or gallop Extremities:  extremities normal, atraumatic, no cyanosis or edema  EKG total/71 without ST or T-wave changes. I personally reviewed this EKG  ASSESSMENT AND PLAN:   Paroxysmal atrial fibrillation- RFA June 2014 History of atypical fibrillation the past status post atrial fib ablation by Luis [name] 09/07/12. He is on low-dose beta blocker and Xarelto oral anticoagulation. He has had no recurrent episodes.   Essential hypertension History of hypertension with blood pressure measured at 116/78. He is on low-dose metoprolol. Continue current medications   HYPERLIPIDEMIA History of hyperlipidemia currently off statin therapy with recent lipid profile measured 01/19/14 revealed a total cholesterol 191, LDL 125 an HDL of [place] [facility] 06/12/2014 3:45 PM

## 2014-06-12 NOTE — Assessment & Plan Note (Signed)
History of hypertension with blood pressure measured at 116/78. He is on low-dose metoprolol. Continue current medications

## 2014-06-12 NOTE — Assessment & Plan Note (Signed)
History of hyperlipidemia currently off statin therapy with recent lipid profile measured 01/19/14 revealed a total cholesterol 191, LDL 125 an HDL of 49

## 2014-06-12 NOTE — Assessment & Plan Note (Signed)
History of atypical fibrillation the past status post atrial fib ablation by Dr. [name] 09/07/12. He is on low-dose beta blocker and Xarelto oral anticoagulation. He has had no recurrent episodes.

## 2014-06-12 NOTE — Patient Instructions (Signed)
Your physician recommends that you schedule a follow-up appointment in: One year.  

## 2014-07-12 ENCOUNTER — Other Ambulatory Visit: Payer: Self-pay | Admitting: Cardiovascular Disease

## 2014-07-12 MED ORDER — METOPROLOL TARTRATE 25 MG PO TABS: 25.0000 mg | ORAL_TABLET | Freq: Two times a day (BID) | ORAL | Status: DC

## 2014-07-12 MED ORDER — METOPROLOL TARTRATE 25 MG PO TABS: 12.5000 mg | ORAL_TABLET | Freq: Two times a day (BID) | ORAL | Status: DC

## 2014-07-12 NOTE — Telephone Encounter (Signed)
°  1. Which medications need to be refilled? Metoprolol 2. Which pharmacy is medication to be sent to? [facility] on [address]  3. Do they need a 30 day or 90 day supply? 90 and refills  4. Would they like a call back once the medication has been sent to the pharmacy? yes

## 2014-07-12 NOTE — Telephone Encounter (Signed)
Rx(s) sent to pharmacy electronically.  

## 2014-07-24 ENCOUNTER — Other Ambulatory Visit: Payer: Self-pay | Admitting: Cardiology

## 2014-07-24 LAB — LIPID PANEL
Cholesterol: 205 mg/dL — ABNORMAL HIGH (ref 0–200)
HDL: 40 mg/dL (ref >=40)
LDL Cholesterol: 130 mg/dL — ABNORMAL HIGH (ref 0–99)
Total CHOL/HDL Ratio: 5.1 Ratio
Triglycerides: 174 mg/dL — ABNORMAL HIGH (ref <150)
VLDL: 35 mg/dL (ref 0–40)

## 2014-07-28 ENCOUNTER — Telehealth: Payer: Self-pay | Admitting: Cardiovascular Disease

## 2014-07-28 NOTE — Telephone Encounter (Signed)
Pt would like his lab results from Monday.

## 2014-07-28 NOTE — Telephone Encounter (Signed)
Patient is returning a call from Cheryl. 

## 2014-07-28 NOTE — Telephone Encounter (Signed)
Received call back from patient.[facility] lab was called 07/24/14 Lipid Panel faxed to office.Received Lipid Panel, gave to Dr.Berry's nurse Curt Bears.

## 2014-07-28 NOTE — Telephone Encounter (Signed)
Returned call to patient he stated he had lab work 07/24/14 and was calling to get results.Advised no results found.[facility] lab called they will fax lab results.

## 2014-08-01 ENCOUNTER — Encounter: Payer: Self-pay | Admitting: Cardiovascular Disease

## 2014-08-01 ENCOUNTER — Encounter: Payer: Self-pay | Admitting: Cardiology

## 2014-08-02 ENCOUNTER — Telehealth: Payer: Self-pay | Admitting: Cardiovascular Disease

## 2014-08-02 NOTE — Telephone Encounter (Signed)
Pt called in wanting his results from his blood work that he just recently had drawn. Please call back  Thanks

## 2014-08-02 NOTE — Telephone Encounter (Signed)
Pt. Called no answer, LMTCB

## 2014-08-03 ENCOUNTER — Other Ambulatory Visit: Payer: Self-pay | Admitting: *Deleted

## 2014-08-03 MED ORDER — ROSUVASTATIN CALCIUM 10 MG PO TABS: 10.0000 mg | ORAL_TABLET | Freq: Every day | ORAL | Status: DC

## 2014-08-03 NOTE — Telephone Encounter (Signed)
Pt. Called in to get lipid results . Results given and crestor 10 mg called in to pharmacy

## 2014-10-02 ENCOUNTER — Other Ambulatory Visit: Payer: Self-pay

## 2014-11-22 ENCOUNTER — Telehealth: Payer: Self-pay | Admitting: Cardiovascular Disease

## 2014-11-22 NOTE — Telephone Encounter (Signed)
Patient calling the office for samples of medication:   1.  What medication and dosage are you requesting samples for? Crestor 10mg  and Xarelto 20mg   2.  Are you currently out of this medication? Yes   3. Are you requesting samples to get you through until a mail order prescription arrives? No he is in the donut hole

## 2014-11-22 NOTE — Telephone Encounter (Signed)
Medication samples have been provided to the patient.  Drug name: xarelto 70  Qty: 25  LOT: [id number]  Exp.Date: 04/2017  Samples left at front desk for patient pick-up. Patient notified.  Notified no crestor samples.  Called his pharmacy and he has been getting brand-name crestor and this is $18/month Notified patient of this.  Fidel Levy 9:50 AM 11/22/2014

## 2014-12-06 ENCOUNTER — Ambulatory Visit (INDEPENDENT_AMBULATORY_CARE_PROVIDER_SITE_OTHER): Payer: PPO | Admitting: Internal Medicine

## 2014-12-06 ENCOUNTER — Encounter: Payer: Self-pay | Admitting: Internal Medicine

## 2014-12-06 VITALS — BP 122/78 | HR 66 | Ht 71.0 in | Wt 180.8 lb

## 2014-12-06 DIAGNOSIS — I480 Paroxysmal atrial fibrillation: Secondary | ICD-10-CM

## 2014-12-06 NOTE — Progress Notes (Signed)
PCP: Criselda Peaches, MD Primary Cardiologist:  Dr Wynelle Beckmann is a 69 y.o. male who presents today for electrophysiology followup.  Since his last visit, the patient reports doing very well.  He has had no afib.   He is pleased with his current health state.  Today, he denies symptoms of chest pain, shortness of breath,  lower extremity edema,  presyncope, or syncope.  The patient is otherwise without complaint today.   Past Medical History  Diagnosis Date  . GERD (gastroesophageal reflux disease)   . History of kidney stones 07/2006    x 1  . Paroxysmal atrial fibrillation     2008 and 20014, PVI 09/07/12  . Hyperlipidemia   . Normal coronary arteries 2008    Normal LV function  . Hypertension   . Allergy     SEASONAL  . Arthritis     BACK AND KNEES   Past Surgical History  Procedure Laterality Date  . Cardiac catheterization Left 07/20/2006    No significant CAD, EF 60%, continue medical therapy  . Tee without cardioversion N/A 09/06/2012    Procedure: TRANSESOPHAGEAL ECHOCARDIOGRAM (TEE);  Surgeon: Pixie Casino, MD;  Location: [facility] ENDOSCOPY;  Service: Cardiovascular;  Laterality: N/A;  . Atrial ablation surgery  09/07/12    PVI by Dr [name]  . Colonoscopy    . Knee arthroscopy    . Upper gastrointestinal endoscopy    . Atrial fibrillation ablation N/A 09/07/2012    Procedure: ATRIAL FIBRILLATION ABLATION;  Surgeon: Thompson Grayer, MD;  Location: [facility] CATH LAB;  Service: Cardiovascular;  Laterality: N/A;    Current Outpatient Prescriptions  Medication Sig Dispense Refill  . metoprolol tartrate (LOPRESSOR) 25 MG tablet Take 1 tablet (25 mg total) by mouth 2 (two) times daily. 180 tablet 3  . omeprazole (PRILOSEC) 20 MG capsule Take 20 mg by mouth daily.    . rivaroxaban (XARELTO) 20 MG TABS tablet Take 1 tablet (20 mg total) by mouth daily. 90 tablet 3  . rosuvastatin (CRESTOR) 10 MG tablet Take 1 tablet (10 mg total) by mouth daily. 90 tablet 3   No current  facility-administered medications for this visit.    Physical Exam: Filed Vitals:   12/06/14 0939  BP: 122/78  Pulse: 66  Height: 5\' 11"  (1.803 m)  Weight: 82.01 kg (180 lb 12.8 oz)    GEN- The patient is well appearing, alert and oriented x 3 today.   Head- normocephalic, atraumatic Eyes-  Sclera clear, conjunctiva pink Ears- hearing intact Oropharynx- clear Lungs- Clear to ausculation bilaterally, normal work of breathing Heart- Regular rate and rhythm, no murmurs, rubs or gallops, PMI not laterally displaced GI- soft, NT, ND, + BS Extremities- no clubbing, cyanosis, or edema  ekg today reveals sinus 66 bpm,  incomplete RBBB  Assessment and Plan:  1. afib Doing well s/p ablation,    He will continue to take metoprolol  His CHADS2VASC score is 1-2 (age, HTN-->pt not sure about htn).  We discussed risks and benefits to continuing or stopping xarelto.  At this time, he is clear that he wants to continue xarelto.  We discussed implantable loop recorder with stopping xarelto as an alternative.  At this time, he would prefer to continue his current treatment.   Follow-up with Dr Gwenlyn Found as scheduled Return to see me in 37 months

## 2014-12-06 NOTE — Patient Instructions (Signed)
Medication Instructions:  No changes were made to your medications today  Labwork: N/A  Testing/Procedures: N/A  Follow-Up: Your physician wants you to follow-up in: 12 months with Dr. [name]. You will receive a reminder letter in the mail two months in advance. If you don't receive a letter, please call our office to schedule the follow-up appointment.   Any Other Special Instructions Will Be Listed Below (If Applicable).

## 2015-01-02 ENCOUNTER — Encounter: Payer: Self-pay | Admitting: Cardiovascular Disease

## 2015-01-17 ENCOUNTER — Telehealth: Payer: Self-pay | Admitting: Internal Medicine

## 2015-01-17 NOTE — Telephone Encounter (Signed)
Left the patient a message to return my call tomorrow and we can discuss what all is going on with him at that time

## 2015-01-17 NOTE — Telephone Encounter (Signed)
New problem    Pt is out of town and said he may be in afib at present. Pt wish to speak to nurse to be advised if he need to come home.

## 2015-01-18 NOTE — Telephone Encounter (Signed)
Patient called back and had an afib episode yesterday after hiking.  He says he walks daily but pushed himself a little harder on the hike.   First time in 2 years he has had an episode.  Was trying to wait it out but became dizzy and could not stand.  He called 911 and went to the ER in [place] was 180.  He converted on his own. BP-- 130/78   I let him know I would discuss with Dr [name] Flecainide as a pill in the pocket.  He was agreeable to this and will call back if further problems or more afib

## 2015-01-19 NOTE — Telephone Encounter (Signed)
Follow Up  Pt states that he would rather see Dr. [name].  Requests a call back to discuss

## 2015-01-19 NOTE — Telephone Encounter (Signed)
Says he is getting lightheaded with exercising.  He went to walk his dog and he became very lightheaded about 5 min into his walk.  Only last a few seconds.  Says feels like it's in rhythm and not fast.  Says every 5 min when walking feels like he's going to pass out(? Heart stopping)  Wants to be hooked up to monitor or something to see what is happening

## 2015-01-22 ENCOUNTER — Ambulatory Visit (HOSPITAL_COMMUNITY)
Admission: RE | Admit: 2015-01-22 | Discharge: 2015-01-22 | Disposition: A | Discharge status: Home / Self Care | Payer: PPO | Source: Ambulatory Visit | Attending: Nurse Practitioner | Admitting: Nurse Practitioner

## 2015-01-22 ENCOUNTER — Other Ambulatory Visit: Payer: Self-pay | Admitting: Cardiovascular Disease

## 2015-01-22 VITALS — BP 138/84 | HR 77 | Ht 71.0 in | Wt 178.6 lb

## 2015-01-22 DIAGNOSIS — R42 Dizziness and giddiness: Secondary | ICD-10-CM | POA: Insufficient documentation

## 2015-01-22 DIAGNOSIS — I4891 Unspecified atrial fibrillation: Secondary | ICD-10-CM | POA: Diagnosis not present

## 2015-01-22 NOTE — Telephone Encounter (Signed)
Discussed with Dr [name] and as the patient is still experiencing symptoms will see if can be seen today in the afib clinic.  I have told him maybe around 1:30, but will forward to them to call him with time.  He was given code(0090) for parking.  Very appreciative of being able to be seen

## 2015-01-22 NOTE — Telephone Encounter (Signed)
Medication samples have been provided to the patient.  Drug name: Xarelto 20 mg Qty: 25 tabs LOT: [id number] Exp.Date: 04/2017 Drug name: Xarelto 20 mg Qty:   7 tabs LOT: [id number] Exp.Date: 07/2017  Samples left at front desk for patient pick-up. Patient notified.  [place], Chelley 5:16 PM 01/22/2015

## 2015-01-22 NOTE — Patient Instructions (Signed)
Arrive at the [facility] and report to admitting at [facility] can eat and drink as normal.  Take medications as normal.  Device Clinic will check your insertion site in 10 days -- Melissa with scheduling will call to schedule.

## 2015-01-22 NOTE — Telephone Encounter (Signed)
Luis [name] is asking for samples of Xarelto 20mg  . Please call   Thanks

## 2015-01-22 NOTE — Telephone Encounter (Signed)
appt given at 3pm.

## 2015-01-23 ENCOUNTER — Encounter (HOSPITAL_COMMUNITY)
Admission: RE | Disposition: A | Discharge status: Home / Self Care | Payer: Self-pay | Source: Ambulatory Visit | Attending: Internal Medicine

## 2015-01-23 ENCOUNTER — Ambulatory Visit (HOSPITAL_COMMUNITY)
Admission: RE | Admit: 2015-01-23 | Discharge: 2015-01-23 | Disposition: A | Discharge status: Home / Self Care | Payer: PPO | Source: Ambulatory Visit | Attending: Internal Medicine | Admitting: Internal Medicine

## 2015-01-23 ENCOUNTER — Encounter (HOSPITAL_COMMUNITY): Payer: Self-pay | Admitting: Nurse Practitioner

## 2015-01-23 DIAGNOSIS — E785 Hyperlipidemia, unspecified: Secondary | ICD-10-CM | POA: Insufficient documentation

## 2015-01-23 DIAGNOSIS — Z8249 Family history of ischemic heart disease and other diseases of the circulatory system: Secondary | ICD-10-CM | POA: Diagnosis not present

## 2015-01-23 DIAGNOSIS — I480 Paroxysmal atrial fibrillation: Secondary | ICD-10-CM | POA: Diagnosis not present

## 2015-01-23 DIAGNOSIS — Z87891 Personal history of nicotine dependence: Secondary | ICD-10-CM | POA: Diagnosis not present

## 2015-01-23 DIAGNOSIS — K219 Gastro-esophageal reflux disease without esophagitis: Secondary | ICD-10-CM | POA: Diagnosis not present

## 2015-01-23 DIAGNOSIS — Z87442 Personal history of urinary calculi: Secondary | ICD-10-CM | POA: Insufficient documentation

## 2015-01-23 DIAGNOSIS — R55 Syncope and collapse: Secondary | ICD-10-CM

## 2015-01-23 DIAGNOSIS — Z7901 Long term (current) use of anticoagulants: Secondary | ICD-10-CM | POA: Diagnosis not present

## 2015-01-23 DIAGNOSIS — I10 Essential (primary) hypertension: Secondary | ICD-10-CM | POA: Diagnosis not present

## 2015-01-23 DIAGNOSIS — I4891 Unspecified atrial fibrillation: Secondary | ICD-10-CM | POA: Diagnosis not present

## 2015-01-23 HISTORY — PX: EP IMPLANTABLE DEVICE: SHX172B

## 2015-01-23 SURGERY — LOOP RECORDER INSERTION

## 2015-01-23 MED ORDER — LIDOCAINE-EPINEPHRINE 1 %-1:100000 IJ SOLN
INTRAMUSCULAR | Status: AC
  Filled 2015-01-23: qty 1

## 2015-01-23 MED ORDER — LIDOCAINE-EPINEPHRINE 1 %-1:100000 IJ SOLN
INTRAMUSCULAR | Status: DC | PRN
  Administered 2015-01-23: 20 mL via INTRADERMAL

## 2015-01-23 SURGICAL SUPPLY — 2 items
LOOP REVEAL LINQSYS (Prosthesis & Implant Heart) ×2 IMPLANT
PACK LOOP INSERTION (CUSTOM PROCEDURE TRAY) ×3 IMPLANT

## 2015-01-23 NOTE — Interval H&P Note (Signed)
History and Physical Interval Note:  01/23/2015 7:46 AM  Luis [name]  has presented today for surgery, with the diagnosis of afib  The various methods of treatment have been discussed with the patient and family. After consideration of risks, benefits and other options for treatment, the patient has consented to  Procedure(s): Loop Recorder Insertion (N/A) as a surgical intervention .  The patient's history has been reviewed, patient examined, no change in status, stable for surgery.  I have reviewed the patient's chart and labs.  Questions were answered to the patient's satisfaction.    Pt with prior afib ablation now with recurrent palpitations suggestive of afib.  In addition, reports recent recurrent symptoms of presyncope without identified cause. Risks, benefits, and alternatives to ILR implant were discussed with the patient who wishes to proceed.  Thompson Grayer MD, [facility] 01/23/2015 7:47 AM    Thompson Grayer

## 2015-01-23 NOTE — Progress Notes (Signed)
Patient ID: Luis [name], male   DOB: September 22, 1945, 69 y.o.   MRN: [id number]     Primary Care Physician: Criselda Peaches, MD Referring Physician:[address] triage Electrophysiologist: Dr. Wilber Oliphant is a 69 y.o. male with a h/o afib s/p ablation in 6/14 that had been doing very well until the past  week. He was in the mountains with his wife and they had just finished climbing a small mountain to view a waterfall. He was able to do this and felt well. [place] returned to their [place] home and shortly after getting there, he developed afib with RVR with v rates up to 170. He was very lightheaded. They preceded to the closest EMS base and was transported to the hospital in [place]. Just short of arrival there, he converted to SR. He was kept in the ER for several hours, where he continued in SR, labs/tests were all normal. He has returned home now and is a very active person walking 4 miles a day. Since resuming his walking, he has noticed that he will become lightheaded and feel like he wants to pass out. He feels his pulse and it feels like he may be having pauses. For the most part, it is not irregular like that of afib, but regular with pauses. If he sits down or goes to one knee, the sensation will pass within 1-2 minutes. Now that he thinks of it, this was going on to a mild degree before the afib incidence in the mountains. He is very concerned about this because it is interrupting his lifestyle and for the last few days he has just sat around in the house. As long as he is inactive, he feels OK. He denies any chest discomfort or shortness of breath with activities, just the sensation that he may pass out. He continues on metoprolol and xarelto. States that he drinks two cocktails a night but this has never been a trigger for afib for him.   Today, he denies symptoms of palpitations, chest pain, shortness of breath, orthopnea, PND, lower extremity edema, dizziness, presyncope,  syncope, or neurologic sequela. The patient is tolerating medications without difficulties and is otherwise without complaint today.   Past Medical History  Diagnosis Date  . GERD (gastroesophageal reflux disease)   . History of kidney stones 07/2006    x 1  . Paroxysmal atrial fibrillation ([place])     2008 and 20014, PVI 09/07/12  . Hyperlipidemia   . Normal coronary arteries 2008    Normal LV function  . Hypertension   . Allergy     SEASONAL  . Arthritis     BACK AND KNEES   Past Surgical History  Procedure Laterality Date  . Cardiac catheterization Left 07/20/2006    No significant CAD, EF 60%, continue medical therapy  . Tee without cardioversion N/A 09/06/2012    Procedure: TRANSESOPHAGEAL ECHOCARDIOGRAM (TEE);  Surgeon: Pixie Casino, MD;  Location: [facility] ENDOSCOPY;  Service: Cardiovascular;  Laterality: N/A;  . Atrial ablation surgery  09/07/12    PVI by Dr [name]  . Colonoscopy    . Knee arthroscopy    . Upper gastrointestinal endoscopy    . Atrial fibrillation ablation N/A 09/07/2012    Procedure: ATRIAL FIBRILLATION ABLATION;  Surgeon: Thompson Grayer, MD;  Location: [facility] CATH LAB;  Service: Cardiovascular;  Laterality: N/A;    Current Outpatient Prescriptions  Medication Sig Dispense Refill  . metoprolol tartrate (LOPRESSOR) 25 MG tablet Take 1 tablet (  25 mg total) by mouth 2 (two) times daily. 180 tablet 3  . omeprazole (PRILOSEC) 20 MG capsule Take 20 mg by mouth daily.    . rivaroxaban (XARELTO) 20 MG TABS tablet Take 1 tablet (20 mg total) by mouth daily. 90 tablet 3  . rosuvastatin (CRESTOR) 10 MG tablet Take 1 tablet (10 mg total) by mouth daily. 90 tablet 3   No current facility-administered medications for this encounter.    No Known Allergies  Social History   Social History  . Marital Status: Married    Spouse Name: N/A  . Number of Children: N/A  . Years of Education: N/A   Occupational History  . Not on file.   Social History Main Topics  . Smoking  status: Former Smoker -- 35 years    Types: Cigarettes    Quit date: 11/27/1994  . Smokeless tobacco: Never Used  . Alcohol Use: 3.5 oz/week    7 drink(s) per week     Comment: 3-4 oz daily  . Drug Use: No  . Sexual Activity: Not on file   Other Topics Concern  . Not on file   Social History Narrative   Lives in [place] with spouse.   Retired Electrical engineer    Family History  Problem Relation Age of Onset  . CAD Father   . Heart Problems Father   . Stroke Father   . Hypertension Father   . Pancreatic cancer Mother   . Heart Problems Brother 51    Had stent put in    ROS- All systems are reviewed and negative except as per the HPI above  Physical Exam: Filed Vitals:   01/22/15 1509  BP: 138/84  Pulse: 77  Height: 5\' 11"  (1.803 m)  Weight: 178 lb 9.6 oz (81.012 kg)    GEN- The patient is well appearing, alert and oriented x 3 today.   Head- normocephalic, atraumatic Eyes-  Sclera clear, conjunctiva pink Ears- hearing intact Oropharynx- clear Neck- supple, no JVP Lymph- no cervical lymphadenopathy Lungs- Clear to ausculation bilaterally, normal work of breathing Heart- Regular rate and rhythm, no murmurs, rubs or gallops, PMI not laterally displaced GI- soft, NT, ND, + BS Extremities- no clubbing, cyanosis, or edema MS- no significant deformity or atrophy Skin- no rash or lesion Psych- euthymic mood, full affect Neuro- strength and sensation are intact  EKG-NSR, IRBBB, v rate 77 bpm Epic records reviewed   Assessment and Plan: 1. Afib Episode recently in [place] with RVR that spontaneously converted, first episode since ablation in 2014. Continue metoprolol  Continue xarelto  2. Lightheadedness with exercise/sensation of presyncope Discussed with pt his symptoms and how to proceed He feels that he is having pauses, heart rate does not feel irregular like afib Happening to some degree this past summer but symptoms now occur with his usual  walking and is affecting his lifestyle to the degree that he is afraid to walk. Event monitor vrs Linq discussed and pt would like to proceed with a Linq monitor. He will have this implanted with Dr. [name] 10/18. Risk of infection/brusing  and benefit of same discussed.  F/u per Dr. Lawrence Marseilles C. Silvester Reierson, [facility] [address] [place],  [address] [phone]

## 2015-01-23 NOTE — H&P (View-Only) (Signed)
Patient ID: Luis [name], male   DOB: 07-22-1945, 69 y.o.   MRN: [id number]     Primary Care Physician: Criselda Peaches, MD Referring Physician:[address] triage Electrophysiologist: Dr. Wilber Oliphant is a 69 y.o. male with a h/o afib s/p ablation in 6/14 that had been doing very well until the past  week. He was in the mountains with his wife and they had just finished climbing a small mountain to view a waterfall. He was able to do this and felt well. They returned to their mountain home and shortly after getting there, he developed afib with RVR with v rates up to 170. He was very lightheaded. They preceded to the closest EMS base and was transported to the hospital in [place]. Just short of arrival there, he converted to SR. He was kept in the ER for several hours, where he continued in SR, labs/tests were all normal. He has returned home now and is a very active person walking 4 miles a day. Since resuming his walking, he has noticed that he will become lightheaded and feel like he wants to pass out. He feels his pulse and it feels like he may be having pauses. For the most part, it is not irregular like that of afib, but regular with pauses. If he sits down or goes to one knee, the sensation will pass within 1-2 minutes. Now that he thinks of it, this was going on to a mild degree before the afib incidence in the mountains. He is very concerned about this because it is interrupting his lifestyle and for the last few days he has just sat around in the house. As long as he is inactive, he feels OK. He denies any chest discomfort or shortness of breath with activities, just the sensation that he may pass out. He continues on metoprolol and xarelto. States that he drinks two cocktails a night but this has never been a trigger for afib for him.   Today, he denies symptoms of palpitations, chest pain, shortness of breath, orthopnea, PND, lower extremity edema, dizziness, presyncope,  syncope, or neurologic sequela. The patient is tolerating medications without difficulties and is otherwise without complaint today.   Past Medical History  Diagnosis Date  . GERD (gastroesophageal reflux disease)   . History of kidney stones 07/2006    x 1  . Paroxysmal atrial fibrillation ([place])     2008 and 20014, PVI 09/07/12  . Hyperlipidemia   . Normal coronary arteries 2008    Normal LV function  . Hypertension   . Allergy     SEASONAL  . Arthritis     BACK AND KNEES   Past Surgical History  Procedure Laterality Date  . Cardiac catheterization Left 07/20/2006    No significant CAD, EF 60%, continue medical therapy  . Tee without cardioversion N/A 09/06/2012    Procedure: TRANSESOPHAGEAL ECHOCARDIOGRAM (TEE);  Surgeon: Pixie Casino, MD;  Location: [facility] ENDOSCOPY;  Service: Cardiovascular;  Laterality: N/A;  . Atrial ablation surgery  09/07/12    PVI by Dr [name]  . Colonoscopy    . Knee arthroscopy    . Upper gastrointestinal endoscopy    . Atrial fibrillation ablation N/A 09/07/2012    Procedure: ATRIAL FIBRILLATION ABLATION;  Surgeon: Thompson Grayer, MD;  Location: [facility] CATH LAB;  Service: Cardiovascular;  Laterality: N/A;    Current Outpatient Prescriptions  Medication Sig Dispense Refill  . metoprolol tartrate (LOPRESSOR) 25 MG tablet Take 1 tablet (  25 mg total) by mouth 2 (two) times daily. 180 tablet 3  . omeprazole (PRILOSEC) 20 MG capsule Take 20 mg by mouth daily.    . rivaroxaban (XARELTO) 20 MG TABS tablet Take 1 tablet (20 mg total) by mouth daily. 90 tablet 3  . rosuvastatin (CRESTOR) 10 MG tablet Take 1 tablet (10 mg total) by mouth daily. 90 tablet 3   No current facility-administered medications for this encounter.    No Known Allergies  Social History   Social History  . Marital Status: Married    Spouse Name: N/A  . Number of Children: N/A  . Years of Education: N/A   Occupational History  . Not on file.   Social History Main Topics  . Smoking  status: Former Smoker -- 35 years    Types: Cigarettes    Quit date: 11/27/1994  . Smokeless tobacco: Never Used  . Alcohol Use: 3.5 oz/week    7 drink(s) per week     Comment: 3-4 oz daily  . Drug Use: No  . Sexual Activity: Not on file   Other Topics Concern  . Not on file   Social History Narrative   Lives in [place] with spouse.   Retired Electrical engineer    Family History  Problem Relation Age of Onset  . CAD Father   . Heart Problems Father   . Stroke Father   . Hypertension Father   . Pancreatic cancer Mother   . Heart Problems Brother 72    Had stent put in    ROS- All systems are reviewed and negative except as per the HPI above  Physical Exam: Filed Vitals:   01/22/15 1509  BP: 138/84  Pulse: 77  Height: 5\' 11"  (1.803 m)  Weight: 178 lb 9.6 oz (81.012 kg)    GEN- The patient is well appearing, alert and oriented x 3 today.   Head- normocephalic, atraumatic Eyes-  Sclera clear, conjunctiva pink Ears- hearing intact Oropharynx- clear Neck- supple, no JVP Lymph- no cervical lymphadenopathy Lungs- Clear to ausculation bilaterally, normal work of breathing Heart- Regular rate and rhythm, no murmurs, rubs or gallops, PMI not laterally displaced GI- soft, NT, ND, + BS Extremities- no clubbing, cyanosis, or edema MS- no significant deformity or atrophy Skin- no rash or lesion Psych- euthymic mood, full affect Neuro- strength and sensation are intact  EKG-NSR, IRBBB, v rate 77 bpm Epic records reviewed   Assessment and Plan: 1. Afib Episode recently in [place] with RVR that spontaneously converted, first episode since ablation in 2014. Continue metoprolol  Continue xarelto  2. Lightheadedness with exercise/sensation of presyncope Discussed with pt his symptoms and how to proceed He feels that he is having pauses, heart rate does not feel irregular like afib Happening to some degree this past summer but symptoms now occur with his usual  walking and is affecting his lifestyle to the degree that he is afraid to walk. Event monitor vrs Linq discussed and pt would like to proceed with a Linq monitor. He will have this implanted with Dr. [name] 10/18. Risk of infection/brusing  and benefit of same discussed.  F/u per Dr. Lawrence Marseilles C. Kazuki Ingle, [facility] [address] [place], [place] [address] [phone]

## 2015-01-24 ENCOUNTER — Telehealth: Payer: Self-pay | Admitting: *Deleted

## 2015-01-24 NOTE — Telephone Encounter (Signed)
Pt states ILR implant site has swelling. Was implanted yesterday. No active bleeding. States bruising is dissipating. Bandage does show quarter size dried blood. I instructed pt not to remove his steri-strips until we see him unless they naturally fall off. Pt agreed. Due to proximity to implant date, pt agreed to call device clinic 01/26/15 if bleeding occurs (takes xarelto as instructed), significant swelling, or fever ensues. If pt does not call, he is aware we will see him for a full wound check 02/01/15.

## 2015-02-01 ENCOUNTER — Ambulatory Visit (INDEPENDENT_AMBULATORY_CARE_PROVIDER_SITE_OTHER): Payer: PPO | Admitting: *Deleted

## 2015-02-01 DIAGNOSIS — I480 Paroxysmal atrial fibrillation: Secondary | ICD-10-CM

## 2015-02-01 LAB — CUP PACEART INCLINIC DEVICE CHECK: MDC IDC SESS DTM: 20161027102324

## 2015-02-01 NOTE — Progress Notes (Signed)
ILR Wound check appointment. Steri-strips removed. Wound without redness or edema. Incision edges approximated, wound well healed. Normal device function. Pt with 0 tachy episodes; 0 brady episodes; 0 asystole.  Carelink summary reports QMO & ROV w/ JA PRN.

## 2015-02-15 ENCOUNTER — Encounter: Payer: Self-pay | Admitting: Internal Medicine

## 2015-02-22 ENCOUNTER — Telehealth: Payer: Self-pay | Admitting: Cardiovascular Disease

## 2015-02-22 ENCOUNTER — Ambulatory Visit (INDEPENDENT_AMBULATORY_CARE_PROVIDER_SITE_OTHER): Payer: PPO | Admitting: *Deleted

## 2015-02-22 DIAGNOSIS — R002 Palpitations: Secondary | ICD-10-CM

## 2015-02-22 DIAGNOSIS — I480 Paroxysmal atrial fibrillation: Secondary | ICD-10-CM

## 2015-02-22 NOTE — Telephone Encounter (Signed)
Informed pt that his loop recorder is checked with his home monitor every night and his home monitor sends [place] the information. Explained to the pt the difference of an alert and a summary report. Pt knows we will do monthly summary reports and that if we ever receive an alert someone would call and talk to him about that. Pt  Verbalized understanding .

## 2015-02-22 NOTE — Telephone Encounter (Signed)
New message  4. Are you calling to see if we received your device transmission? Y and N   Comments: pt called states that he is not sure why he has a device check he doesn't believe he even has a device. He says he is only wearing a monitor. Second, pt states that he is not sure how to send a signal.

## 2015-03-12 ENCOUNTER — Telehealth: Payer: Self-pay | Admitting: Cardiovascular Disease

## 2015-03-12 ENCOUNTER — Telehealth: Payer: Self-pay | Admitting: *Deleted

## 2015-03-12 ENCOUNTER — Telehealth: Payer: Self-pay | Admitting: Internal Medicine

## 2015-03-12 MED ORDER — RIVAROXABAN 20 MG PO TABS: 20.0000 mg | ORAL_TABLET | Freq: Every day | ORAL | Status: DC

## 2015-03-12 NOTE — Telephone Encounter (Signed)
Patient would like samples of Xarelto 20 mg.  He has enough medicine for tomorrow and Wednesday.

## 2015-03-12 NOTE — Telephone Encounter (Signed)
Patient calling the office for samples of medication:   1.  What medication and dosage are you requesting samples for? xarelto 20 mg  2.  Are you currently out of this medication? Almost- 2 or 3 pills left

## 2015-03-12 NOTE — Telephone Encounter (Signed)
Called patient, spoke to his wife. I let her know that we currently don't have Xarelto 20 mg (samples) available. If he could call later in the week to check to see if we had any then.

## 2015-03-12 NOTE — Telephone Encounter (Signed)
Samples available to pick up  3 bottles PATIENT AWARE

## 2015-03-15 ENCOUNTER — Telehealth: Payer: Self-pay | Admitting: Internal Medicine

## 2015-03-15 NOTE — Telephone Encounter (Signed)
Per phone note form 03/12/15, patient picked some up from the [facility] office. He stated that he did pick them up, but they only gave him two weeks worth. He is not out as indicated on phone note from today. He will call back later in the month.

## 2015-03-15 NOTE — Telephone Encounter (Signed)
New message    Patient calling the office for samples of medication:   1.  What medication and dosage are you requesting samples for?  xarelto  20 mg   2.  Are you currently out of this medication? Yes - need enough to get first of year.

## 2015-03-22 ENCOUNTER — Telehealth: Payer: Self-pay | Admitting: *Deleted

## 2015-03-22 ENCOUNTER — Encounter: Payer: Self-pay | Admitting: Internal Medicine

## 2015-03-22 NOTE — Telephone Encounter (Signed)
[facility] requesting call back.  Need to know what patient's symptoms were with symptom episode on Carelink transmission from 03/19/15.

## 2015-03-23 NOTE — Telephone Encounter (Signed)
Returned patient's call.  He states that this symptom episode awoke him, so he used his symptom activator.  Patient checked his pulse and felt that his heart rate was irregular.  He was concerned because the rate was fast.  He reports the episode lasted for a while and then he felt himself "convert".  Patient reports taking all of his medications, including his Eliquis and metoprolol, as directed.  Advised patient that I will review episode with Dr. [name] when he is back in the office on Monday and call him back.  Patient is agreeable to this plan, and is aware to call with worsening symptoms, questions, or concerns.  He is aware to seek medical attention if symptoms worsen over the weekend.  Will route call to Dr. [name] for review of symptoms.

## 2015-03-23 NOTE — Telephone Encounter (Signed)
Pt returning your call informed him that you was with a pt and I would give you a message to call him back. He verbalized understanding and said to call him back at [phone].

## 2015-03-26 ENCOUNTER — Ambulatory Visit (INDEPENDENT_AMBULATORY_CARE_PROVIDER_SITE_OTHER): Payer: PPO | Admitting: *Deleted

## 2015-03-26 DIAGNOSIS — I480 Paroxysmal atrial fibrillation: Secondary | ICD-10-CM | POA: Diagnosis not present

## 2015-03-26 LAB — CUP PACEART REMOTE DEVICE CHECK: Date Time Interrogation Session: 20161117110518

## 2015-03-26 NOTE — Progress Notes (Signed)
Carelink Summary Report 

## 2015-03-27 ENCOUNTER — Telehealth: Payer: Self-pay | Admitting: *Deleted

## 2015-03-27 NOTE — Telephone Encounter (Signed)
Encounter closed.  See phone note from 03/22/15 for additional details.

## 2015-03-27 NOTE — Telephone Encounter (Signed)
Called patient back to let him know that Chanetta Marshall, NP reviewed the transmission and patient's symptoms and recommended that he follow-up with the AF clinic for titration of rate control meds.  Patient aware and agreeable to plan.  He also states that he was able to reach [place] and that he successfully got his Carelink monitor to work.  Patient denies any questions or concerns at this time and is appreciative of call.  Staff message sent to Marzetta Board, RN, regarding AF clinic appointment.

## 2015-03-27 NOTE — Telephone Encounter (Signed)
Patient called concerned as his Carelink monitor has a red X on it, indicating the information is not transmitting from his device/monitor to the cloud.  Advised patient that he should call Carelink tech services.  He states he has called them previously and that they were no help.  Patient wonders if the monitor has a defective battery.  Advised patient that the monitor uses AC current and does not rely on batteries.  Recommended that patient call tech services for monitor trouble shooting assistance.  Gave patient phone number.    Patient is also concerned about his symptom episode from 03/22/15.  He is concerned because he "hasn't heard anything".  Advised patient that I will call him back if Dr. [name] has any additional recommendations, but to continue current treatment plan at this time.  Patient verbalizes understanding and denies additional questions or concerns at this time.  He is aware to call with worsening symptoms or questions.

## 2015-03-27 NOTE — Progress Notes (Signed)
Carelink Summary Report / Loop Recorder 

## 2015-03-27 NOTE — Telephone Encounter (Signed)
Patient called back and spoke with Pamala Hurry, CMA, stating he called the wrong number for Carelink tech services.  She gave him the phone number again.  He stated he will call this morning for monitor troubleshooting.

## 2015-04-04 ENCOUNTER — Encounter (HOSPITAL_COMMUNITY): Payer: Self-pay | Admitting: Nurse Practitioner

## 2015-04-04 ENCOUNTER — Ambulatory Visit (HOSPITAL_COMMUNITY)
Admission: RE | Admit: 2015-04-04 | Discharge: 2015-04-04 | Disposition: A | Discharge status: Home / Self Care | Payer: PPO | Source: Ambulatory Visit | Attending: Nurse Practitioner | Admitting: Nurse Practitioner

## 2015-04-04 VITALS — BP 148/82 | HR 79 | Ht 71.0 in | Wt 179.6 lb

## 2015-04-04 DIAGNOSIS — I480 Paroxysmal atrial fibrillation: Secondary | ICD-10-CM | POA: Insufficient documentation

## 2015-04-04 MED ORDER — DILTIAZEM HCL 30 MG PO TABS: 30.0000 mg | ORAL_TABLET | Freq: Four times a day (QID) | ORAL | Status: DC

## 2015-04-04 MED ORDER — METOPROLOL TARTRATE 25 MG PO TABS: 25.0000 mg | ORAL_TABLET | Freq: Two times a day (BID) | ORAL | Status: DC

## 2015-04-04 MED ORDER — DILTIAZEM HCL 30 MG PO TABS: 30.0000 mg | ORAL_TABLET | ORAL | Status: DC | PRN

## 2015-04-04 MED ORDER — METOPROLOL TARTRATE 25 MG PO TABS: 37.5000 mg | ORAL_TABLET | Freq: Two times a day (BID) | ORAL | Status: DC

## 2015-04-04 NOTE — Patient Instructions (Addendum)
Your physician has recommended you make the following change in your medication:   1) Take diltiazem 30 mg as needed 2) Increse metoprolol to 1.5 am and 1.5 pm.

## 2015-04-04 NOTE — Progress Notes (Signed)
Patient ID: Luis [name], male   DOB: 11-Apr-1945, 69 y.o.   MRN: [id number]     Primary Care Physician: Criselda Peaches, MD Referring Physician: Dr. Wilber Oliphant is a 69 y.o. male with a h/o afib s/p ablation in 2014. I saw him in October after he had a [place] in the mountains and has an episode of afib. He also was describing episodes where he thought his her was having long pauses and when he was walking would have to stop and fall to one knee for a few minutes. Linq monitor was discussed to further figure out  symptoms and was implanted the next day by Dr. [name].  He is being seen in the afib clinic today for an epiosode of afib with RVR the night of 12/12. He states that he was having a bad dream and woke up in afib. About 20 minutes of being awake, he returned to [place]. This is the only episode of afib that he has experienced since having the remote monitor. The symptoms of feeling like his heart was pausing and having to stop when walking have resolved. He thinks it may have had something to do with walking in the heat and /or anxiety.  Today, he denies symptoms of palpitations, chest pain, shortness of breath, orthopnea, PND, lower extremity edema, dizziness, presyncope, syncope, or neurologic sequela. The patient is tolerating medications without difficulties and is otherwise without complaint today.   Past Medical History  Diagnosis Date  . GERD (gastroesophageal reflux disease)   . History of kidney stones 07/2006    x 1  . Paroxysmal atrial fibrillation ([place])     2008 and 20014, PVI 09/07/12  . Hyperlipidemia   . Normal coronary arteries 2008    Normal LV function  . Hypertension   . Allergy     SEASONAL  . Arthritis     BACK AND KNEES   Past Surgical History  Procedure Laterality Date  . Cardiac catheterization Left 07/20/2006    No significant CAD, EF 60%, continue medical therapy  . Tee without cardioversion N/A 09/06/2012    Procedure: TRANSESOPHAGEAL  ECHOCARDIOGRAM (TEE);  Surgeon: Pixie Casino, MD;  Location[facility] ENDOSCOPY;  Service: Cardiovascular;  Laterality: N/A;  . Atrial ablation surgery  09/07/12    PVI by Dr [name]  . Colonoscopy    . Knee arthroscopy    . Upper gastrointestinal endoscopy    . Atrial fibrillation ablation N/A 09/07/2012    Procedure: ATRIAL FIBRILLATION ABLATION;  Surgeon: Thompson Grayer, MD;  Location: [facility] CATH LAB;  Service: Cardiovascular;  Laterality: N/A;  . Ep implantable device N/A 01/23/2015    Procedure: Loop Recorder Insertion;  Surgeon: Thompson Grayer, MD;  Location: [place] CV LAB;  Service: Cardiovascular;  Laterality: N/A;    Current Outpatient Prescriptions  Medication Sig Dispense Refill  . metoprolol tartrate (LOPRESSOR) 25 MG tablet Take 1.5 tablets (37.5 mg total) by mouth 2 (two) times daily. 270 tablet 3  . omeprazole (PRILOSEC) 20 MG capsule Take 20 mg by mouth daily.    . rivaroxaban (XARELTO) 20 MG TABS tablet Take 1 tablet (20 mg total) by mouth daily. 15 tablet 0  . rosuvastatin (CRESTOR) 10 MG tablet Take 1 tablet (10 mg total) by mouth daily. 90 tablet 3  . diltiazem (CARDIZEM) 30 MG tablet Take 1 tablet (30 mg total) by mouth as needed. 15 tablet 3   No current facility-administered medications for this encounter.  No Known Allergies  Social History   Social History  . Marital Status: Married    Spouse Name: N/A  . Number of Children: N/A  . Years of Education: N/A   Occupational History  . Not on file.   Social History Main Topics  . Smoking status: Former Smoker -- 35 years    Types: Cigarettes    Quit date: 11/27/1994  . Smokeless tobacco: Never Used  . Alcohol Use: 3.5 oz/week    7 drink(s) per week     Comment: 3-4 oz daily  . Drug Use: No  . Sexual Activity: Not on file   Other Topics Concern  . Not on file   Social History Narrative   Lives in [place] with spouse.   Retired Electrical engineer    Family History  Problem Relation Age of Onset   . CAD Father   . Heart Problems Father   . Stroke Father   . Hypertension Father   . Pancreatic cancer Mother   . Heart Problems Brother 10    Had stent put in    ROS- All systems are reviewed and negative except as per the HPI above  Physical Exam: Filed Vitals:   04/04/15 1401  BP: 148/82  Pulse: 79  Height: 5\' 11"  (1.803 m)  Weight: 179 lb 9.6 oz (81.466 kg)    GEN- The patient is well appearing, alert and oriented x 3 today.   Head- normocephalic, atraumatic Eyes-  Sclera clear, conjunctiva pink Ears- hearing intact Oropharynx- clear Neck- supple, no JVP Lymph- no cervical lymphadenopathy Lungs- Clear to ausculation bilaterally, normal work of breathing Heart- Regular rate and rhythm, no murmurs, rubs or gallops, PMI not laterally displaced GI- soft, NT, ND, + BS Extremities- no clubbing, cyanosis, or edema MS- no significant deformity or atrophy Skin- no rash or lesion Psych- euthymic mood, full affect Neuro- strength and sensation are intact  EKG- NSR at 79 bpm, v rate 79 bpm, pr int 156 ms, qrs int 90 ms, qtc 428 ms. Epic records reviewed Strips reviewed from 12/12, afib with rvr   rates around 150  Assessment and Plan: 1. PAF Discussed options with pt He is in favor of an increase of metoprolol tartrate  37.5 mg bid Cardizem 30 mg pill in pocket also given for rvr episodes  F/u as needed in afib clinic Continue remote transmissions per [place]. Clytee Heinrich, [facility] [address] [place], [place] [address] [phone]

## 2015-04-05 ENCOUNTER — Ambulatory Visit (HOSPITAL_COMMUNITY): Payer: PPO | Admitting: Nurse Practitioner

## 2015-04-09 ENCOUNTER — Other Ambulatory Visit: Payer: Self-pay | Admitting: Cardiovascular Disease

## 2015-04-23 ENCOUNTER — Ambulatory Visit (INDEPENDENT_AMBULATORY_CARE_PROVIDER_SITE_OTHER): Payer: PPO | Admitting: *Deleted

## 2015-04-23 DIAGNOSIS — I480 Paroxysmal atrial fibrillation: Secondary | ICD-10-CM

## 2015-04-24 NOTE — Progress Notes (Signed)
Carelink Summary Report / Loop Recorder 

## 2015-05-11 LAB — CUP PACEART REMOTE DEVICE CHECK: MDC IDC SESS DTM: 20161213050500

## 2015-05-23 ENCOUNTER — Ambulatory Visit (INDEPENDENT_AMBULATORY_CARE_PROVIDER_SITE_OTHER): Payer: PPO | Admitting: *Deleted

## 2015-05-23 DIAGNOSIS — I480 Paroxysmal atrial fibrillation: Secondary | ICD-10-CM

## 2015-05-23 NOTE — Progress Notes (Signed)
Carelink Summary Report / Loop Recorder 

## 2015-05-29 DIAGNOSIS — I4891 Unspecified atrial fibrillation: Secondary | ICD-10-CM | POA: Diagnosis not present

## 2015-05-29 DIAGNOSIS — I10 Essential (primary) hypertension: Secondary | ICD-10-CM | POA: Diagnosis not present

## 2015-05-29 DIAGNOSIS — Z23 Encounter for immunization: Secondary | ICD-10-CM | POA: Diagnosis not present

## 2015-06-11 NOTE — Progress Notes (Signed)
Carelink summary report received. Battery status OK. Normal device function. No new symptom episodes, tachy episodes, brady, or pause episodes. No new AF episodes. Monthly summary reports and ROV/PRNwatson

## 2015-06-14 DIAGNOSIS — H524 Presbyopia: Secondary | ICD-10-CM | POA: Diagnosis not present

## 2015-06-14 DIAGNOSIS — H5203 Hypermetropia, bilateral: Secondary | ICD-10-CM | POA: Diagnosis not present

## 2015-06-14 DIAGNOSIS — H2513 Age-related nuclear cataract, bilateral: Secondary | ICD-10-CM | POA: Diagnosis not present

## 2015-06-15 LAB — CUP PACEART REMOTE DEVICE CHECK: MDC IDC SESS DTM: 20170116113522

## 2015-06-20 LAB — CUP PACEART REMOTE DEVICE CHECK: Date Time Interrogation Session: 20170215113519

## 2015-06-20 NOTE — Progress Notes (Signed)
Carelink summary report received. Battery status OK. Normal device function. No new symptom episodes, tachy episodes, brady, or pause episodes. No new AF episodes. Monthly summary reports and ROV/PRN 

## 2015-06-22 ENCOUNTER — Ambulatory Visit (INDEPENDENT_AMBULATORY_CARE_PROVIDER_SITE_OTHER): Payer: PPO | Admitting: *Deleted

## 2015-06-22 DIAGNOSIS — I480 Paroxysmal atrial fibrillation: Secondary | ICD-10-CM

## 2015-06-22 NOTE — Progress Notes (Signed)
Carelink Summary Report / Loop Recorder 

## 2015-06-26 DIAGNOSIS — M25561 Pain in right knee: Secondary | ICD-10-CM | POA: Diagnosis not present

## 2015-06-26 DIAGNOSIS — M25562 Pain in left knee: Secondary | ICD-10-CM | POA: Diagnosis not present

## 2015-07-23 ENCOUNTER — Ambulatory Visit (INDEPENDENT_AMBULATORY_CARE_PROVIDER_SITE_OTHER): Payer: PPO | Admitting: *Deleted

## 2015-07-23 DIAGNOSIS — I480 Paroxysmal atrial fibrillation: Secondary | ICD-10-CM | POA: Diagnosis not present

## 2015-07-23 NOTE — Progress Notes (Signed)
Carelink Summary Report / Loop Recorder 

## 2015-07-30 ENCOUNTER — Other Ambulatory Visit: Payer: Self-pay | Admitting: Cardiology

## 2015-07-30 NOTE — Telephone Encounter (Signed)
Rx(s) sent to pharmacy electronically.  

## 2015-08-09 ENCOUNTER — Ambulatory Visit (INDEPENDENT_AMBULATORY_CARE_PROVIDER_SITE_OTHER): Payer: PPO | Admitting: Cardiovascular Disease

## 2015-08-09 ENCOUNTER — Encounter: Payer: Self-pay | Admitting: Cardiovascular Disease

## 2015-08-09 VITALS — BP 128/76 | HR 84 | Ht 71.0 in | Wt 173.5 lb

## 2015-08-09 DIAGNOSIS — I480 Paroxysmal atrial fibrillation: Secondary | ICD-10-CM | POA: Diagnosis not present

## 2015-08-09 DIAGNOSIS — Z0000 Encounter for general adult medical examination without abnormal findings: Secondary | ICD-10-CM

## 2015-08-09 MED ORDER — CO Q 10 100 MG PO CAPS: 200.0000 mg | ORAL_CAPSULE | Freq: Every day | ORAL | Status: DC

## 2015-08-09 MED ORDER — ROSUVASTATIN CALCIUM 10 MG PO TABS: 10.0000 mg | ORAL_TABLET | Freq: Every day | ORAL | Status: DC

## 2015-08-09 NOTE — Assessment & Plan Note (Signed)
History of hypertension blood pressure measured at 120/76. He is on Toprol. Continue current meds at current dosing

## 2015-08-09 NOTE — Progress Notes (Signed)
08/09/2015 Luis [name]   July 14, 1945  [id number]  Primary Physician GREEN, [name], MD Primary Cardiologist: Lorretta Harp MD Luis [name]   HPI:  The patient is a very pleasant 70 year old, thin and fit-appearing, married Caucasian male, father of 32, grandfather to 2 grandchildren who I last saw him in the office 06/12/14. He has a history of paroxysmal atrial fibrillation maintaining sinus rhythm when I saw him in December. He has been evaluated by [name] at [name] remotely back in May 2008 and was cath'd by Dr. [name] the month before revealing noncritical CAD with normal LV function. He does have a 35-pack-year history of tobacco abuse having quit August 1996. His other problems include history of hypertension, mild hyperlipidemia and family history of heart disease with a father who had an MI at age 34 and a brother who had stent placed at age 62. He did get occasional palpitations when I saw him back in December. He has had 2 episodes of symptomatic A-fib, the last of which occurred on June 19, 2012, which resulted in presyncope. He noticed this while he was playing golf. When he presented to the ER his heart rate was 144. He was placed on IV diltiazem and ultimately converted to sinus rhythm and was discharged home on flecainide 50 mg p.o. b.i.d. Because his CHADS2 score was 1 it was elected to keep him on aspirin.he underwent atrial fibrillation ablation by [name] on 09/07/12 with an excellent clinical result. He's had no recurrent episodes appear PAF. His Flecainide was discontinued. He is on Xarelto oral anticoagulation. He denies chest pain or shortness of breath. He had an abdominal duplex that showed no evidence of aneurysm. E is on Crestor with recent lipid profile performed by his PCP 12/13/14 revealed a total cholesterol 133, LDL 67 and HDL of 44. He had an implantable loop recorder placed by Dr. [name] in October of last year. He's had one episode of  breakthrough PAF since his ablation 3 years ago.    Current Outpatient Prescriptions  Medication Sig Dispense Refill  . diltiazem (CARDIZEM) 30 MG tablet Take 1 tablet (30 mg total) by mouth as needed. 15 tablet 3  . metoprolol tartrate (LOPRESSOR) 25 MG tablet Take 1.5 tablets (37.5 mg total) by mouth 2 (two) times daily. 270 tablet 3  . omeprazole (PRILOSEC) 20 MG capsule Take 20 mg by mouth daily.    . rosuvastatin (CRESTOR) 10 MG tablet Take 1 tablet (10 mg total) by mouth daily. 90 tablet 3  . XARELTO 20 MG TABS tablet TAKE ONE TABLET BY MOUTH ONCE DAILY 90 tablet 2  . Coenzyme Q10 (CO Q 10) 100 MG CAPS Take 200 mg by mouth daily. 30 capsule    No current facility-administered medications for this visit.    No Known Allergies  Social History   Social History  . Marital Status: Married    Spouse Name: N/A  . Number of Children: N/A  . Years of Education: N/A   Occupational History  . Not on file.   Social History Main Topics  . Smoking status: Former Smoker -- 35 years    Types: Cigarettes    Quit date: 11/27/1994  . Smokeless tobacco: Never Used  . Alcohol Use: 3.5 oz/week    7 drink(s) per week     Comment: 3-4 oz daily  . Drug Use: No  . Sexual Activity: Not on file   Other Topics Concern  . Not on  file   Social History Narrative   Lives in [place] with spouse.   Retired Electrical engineer     Review of Systems: General: negative for chills, fever, night sweats or weight changes.  Cardiovascular: negative for chest pain, dyspnea on exertion, edema, orthopnea, palpitations, paroxysmal nocturnal dyspnea or shortness of breath Dermatological: negative for rash Respiratory: negative for cough or wheezing Urologic: negative for hematuria Abdominal: negative for nausea, vomiting, diarrhea, bright red blood per rectum, melena, or hematemesis Neurologic: negative for visual changes, syncope, or dizziness All other systems reviewed and are otherwise negative  except as noted above.    Blood pressure 128/76, pulse 84, height 5\' 11"  (1.803 m), weight 173 lb 8 oz (78.699 kg).  General appearance: alert and no distress Neck: no adenopathy, no carotid bruit, no JVD, supple, symmetrical, trachea midline and thyroid not enlarged, symmetric, no tenderness/mass/nodules Lungs: clear to auscultation bilaterally Heart: regular rate and rhythm, S1, S2 normal, no murmur, click, rub or gallop Extremities: extremities normal, atraumatic, no cyanosis or edema  EKG normal sinus rhythm at 77 with incomplete right bundle-branch block I personally reviewed this EKG  ASSESSMENT AND PLAN:   HYPERLIPIDEMIA History of hyperlipidemia on Crestor with recent lipid profile performed by his PCP 12/13/14. Total cholesterol 133, LDL of 67 and HDL of 44.  Essential hypertension History of hypertension blood pressure measured at 120/76. He is on Toprol. Continue current meds at current dosing  Paroxysmal atrial fibrillation- RFA June 2014 History of paroxysmal atrial fibrillation status post A. Fib ablation by Dr. [name]  3 years ago. He recently had an implantable loop recorder implanted in October of last year. He's had one episode of PAF since his ablation. He remains on Xarelto oral anticoagulation.      Lorretta Harp MD FACP,FACC,FAHA, [facility] 08/09/2015 8:21 AM

## 2015-08-09 NOTE — Assessment & Plan Note (Signed)
History of paroxysmal atrial fibrillation status post A. Fib ablation by Dr. [name]  3 years ago. He recently had an implantable loop recorder implanted in October of last year. He's had one episode of PAF since his ablation. He remains on Xarelto oral anticoagulation.

## 2015-08-09 NOTE — Assessment & Plan Note (Signed)
History of hyperlipidemia on Crestor with recent lipid profile performed by his PCP 12/13/14. Total cholesterol 133, LDL of 67 and HDL of 44.

## 2015-08-09 NOTE — Patient Instructions (Signed)
Medication Instructions:  Your physician has recommended you make the following change in your medication:  1) START CoQ10 200 mg by mouth ONCE daily   Labwork: none  Testing/Procedures: none  Follow-Up: Your physician wants you to follow-up in: 12 months with Dr. Gwenlyn Found. You will receive a reminder letter in the mail two months in advance. If you don't receive a letter, please call our office to schedule the follow-up appointment.   Any Other Special Instructions Will Be Listed Below (If Applicable).     If you need a refill on your cardiac medications before your next appointment, please call your pharmacy.

## 2015-08-21 ENCOUNTER — Ambulatory Visit (INDEPENDENT_AMBULATORY_CARE_PROVIDER_SITE_OTHER): Payer: PPO | Admitting: *Deleted

## 2015-08-21 DIAGNOSIS — I480 Paroxysmal atrial fibrillation: Secondary | ICD-10-CM

## 2015-08-21 NOTE — Progress Notes (Signed)
Carelink Summary Report / Loop Recorder 

## 2015-08-22 LAB — CUP PACEART REMOTE DEVICE CHECK: Date Time Interrogation Session: 20170516123520

## 2015-08-31 LAB — CUP PACEART REMOTE DEVICE CHECK: Date Time Interrogation Session: 20170317113529

## 2015-09-03 LAB — CUP PACEART REMOTE DEVICE CHECK: MDC IDC SESS DTM: 20170416120514

## 2015-09-03 NOTE — Progress Notes (Signed)
Carelink summary report received. Battery status OK. Normal device function. No new symptom episodes, tachy episodes, brady, or pause episodes. No new AF episodes. Monthly summary reports and ROV/PRN 

## 2015-09-20 ENCOUNTER — Ambulatory Visit (INDEPENDENT_AMBULATORY_CARE_PROVIDER_SITE_OTHER): Payer: PPO | Admitting: *Deleted

## 2015-09-20 DIAGNOSIS — I480 Paroxysmal atrial fibrillation: Secondary | ICD-10-CM | POA: Diagnosis not present

## 2015-09-20 NOTE — Progress Notes (Signed)
Carelink Summary Report / Loop Recorder 

## 2015-10-19 LAB — CUP PACEART REMOTE DEVICE CHECK: Date Time Interrogation Session: 20170615123602

## 2015-10-22 ENCOUNTER — Ambulatory Visit (INDEPENDENT_AMBULATORY_CARE_PROVIDER_SITE_OTHER): Payer: PPO | Admitting: *Deleted

## 2015-10-22 DIAGNOSIS — I480 Paroxysmal atrial fibrillation: Secondary | ICD-10-CM | POA: Diagnosis not present

## 2015-10-22 NOTE — Progress Notes (Signed)
Carelink Summary Report / Loop Recorder 

## 2015-11-13 LAB — CUP PACEART REMOTE DEVICE CHECK: Date Time Interrogation Session: 20170715133538

## 2015-11-19 ENCOUNTER — Ambulatory Visit (INDEPENDENT_AMBULATORY_CARE_PROVIDER_SITE_OTHER): Payer: PPO | Admitting: *Deleted

## 2015-11-19 DIAGNOSIS — I480 Paroxysmal atrial fibrillation: Secondary | ICD-10-CM

## 2015-11-19 NOTE — Progress Notes (Signed)
Carelink Summary Report / Loop Recorder 

## 2015-12-06 DIAGNOSIS — M25561 Pain in right knee: Secondary | ICD-10-CM | POA: Diagnosis not present

## 2015-12-06 DIAGNOSIS — M1712 Unilateral primary osteoarthritis, left knee: Secondary | ICD-10-CM | POA: Diagnosis not present

## 2015-12-06 DIAGNOSIS — M170 Bilateral primary osteoarthritis of knee: Secondary | ICD-10-CM | POA: Diagnosis not present

## 2015-12-06 DIAGNOSIS — M1711 Unilateral primary osteoarthritis, right knee: Secondary | ICD-10-CM | POA: Diagnosis not present

## 2015-12-11 ENCOUNTER — Telehealth: Payer: Self-pay | Admitting: Internal Medicine

## 2015-12-11 ENCOUNTER — Encounter: Payer: Self-pay | Admitting: Internal Medicine

## 2015-12-11 NOTE — Telephone Encounter (Signed)
New Message: ° ° ° ° °Please call. °

## 2015-12-11 NOTE — Telephone Encounter (Signed)
Most recent transmission reviewed from [facility]. 1 new symptom episode marked 12/08/15. This episode appears to be SR with PACs and PVCs, HR controlled. Spoke with Mr. Luis [name]. He denies dizziness and syncope. He reports that he has noticed an "erratic" heart beat intermittently since Friday night, especially when he lays down and he feels these early beats at the top of his stomach. He would like Dr. [name] to be made aware. I ensured that I would have Dr. [name] review the episode and if there was any intervention deemed necessary we would be in touch with him. He is appreciative and denies any other questions at this time.

## 2015-12-13 DIAGNOSIS — I739 Peripheral vascular disease, unspecified: Secondary | ICD-10-CM | POA: Diagnosis not present

## 2015-12-13 DIAGNOSIS — D509 Iron deficiency anemia, unspecified: Secondary | ICD-10-CM | POA: Diagnosis not present

## 2015-12-13 DIAGNOSIS — Z125 Encounter for screening for malignant neoplasm of prostate: Secondary | ICD-10-CM | POA: Diagnosis not present

## 2015-12-13 DIAGNOSIS — Z0000 Encounter for general adult medical examination without abnormal findings: Secondary | ICD-10-CM | POA: Diagnosis not present

## 2015-12-13 DIAGNOSIS — E785 Hyperlipidemia, unspecified: Secondary | ICD-10-CM | POA: Diagnosis not present

## 2015-12-13 DIAGNOSIS — I10 Essential (primary) hypertension: Secondary | ICD-10-CM | POA: Diagnosis not present

## 2015-12-13 DIAGNOSIS — Z23 Encounter for immunization: Secondary | ICD-10-CM | POA: Diagnosis not present

## 2015-12-13 DIAGNOSIS — I4891 Unspecified atrial fibrillation: Secondary | ICD-10-CM | POA: Diagnosis not present

## 2015-12-15 LAB — CUP PACEART REMOTE DEVICE CHECK: Date Time Interrogation Session: 20170814140707

## 2015-12-19 ENCOUNTER — Ambulatory Visit (INDEPENDENT_AMBULATORY_CARE_PROVIDER_SITE_OTHER): Payer: PPO | Admitting: *Deleted

## 2015-12-19 DIAGNOSIS — I480 Paroxysmal atrial fibrillation: Secondary | ICD-10-CM | POA: Diagnosis not present

## 2015-12-19 NOTE — Progress Notes (Signed)
Carelink Summary Report / Loop Recorder 

## 2015-12-26 ENCOUNTER — Encounter: Payer: Self-pay | Admitting: Internal Medicine

## 2015-12-31 ENCOUNTER — Other Ambulatory Visit: Payer: Self-pay | Admitting: Cardiovascular Disease

## 2016-01-01 ENCOUNTER — Other Ambulatory Visit: Payer: Self-pay

## 2016-01-01 MED ORDER — RIVAROXABAN 20 MG PO TABS: 20.0000 mg | ORAL_TABLET | Freq: Every day | ORAL | 2 refills | Status: DC

## 2016-01-12 LAB — CUP PACEART REMOTE DEVICE CHECK: Date Time Interrogation Session: 20170913143943

## 2016-01-12 NOTE — Progress Notes (Signed)
Carelink summary report received. Battery status OK. Normal device function. No new tachy episodes, brady, or pause episodes. No new AF episodes. 1 symptom episode, SR with PAC's.  Monthly summary reports and ROV/PRN 

## 2016-01-15 DIAGNOSIS — L0293 Carbuncle, unspecified: Secondary | ICD-10-CM | POA: Diagnosis not present

## 2016-01-18 ENCOUNTER — Ambulatory Visit (INDEPENDENT_AMBULATORY_CARE_PROVIDER_SITE_OTHER): Payer: PPO | Admitting: *Deleted

## 2016-01-18 ENCOUNTER — Encounter (HOSPITAL_COMMUNITY): Payer: Self-pay | Admitting: Nurse Practitioner

## 2016-01-18 ENCOUNTER — Ambulatory Visit (HOSPITAL_COMMUNITY)
Admission: RE | Admit: 2016-01-18 | Discharge: 2016-01-18 | Disposition: A | Discharge status: Home / Self Care | Payer: PPO | Source: Ambulatory Visit | Attending: Nurse Practitioner | Admitting: Nurse Practitioner

## 2016-01-18 VITALS — BP 140/84 | HR 67 | Ht 71.0 in | Wt 176.4 lb

## 2016-01-18 DIAGNOSIS — K219 Gastro-esophageal reflux disease without esophagitis: Secondary | ICD-10-CM | POA: Insufficient documentation

## 2016-01-18 DIAGNOSIS — E785 Hyperlipidemia, unspecified: Secondary | ICD-10-CM | POA: Diagnosis not present

## 2016-01-18 DIAGNOSIS — I10 Essential (primary) hypertension: Secondary | ICD-10-CM | POA: Insufficient documentation

## 2016-01-18 DIAGNOSIS — Z7901 Long term (current) use of anticoagulants: Secondary | ICD-10-CM | POA: Insufficient documentation

## 2016-01-18 DIAGNOSIS — I4891 Unspecified atrial fibrillation: Secondary | ICD-10-CM | POA: Diagnosis not present

## 2016-01-18 DIAGNOSIS — M1990 Unspecified osteoarthritis, unspecified site: Secondary | ICD-10-CM | POA: Diagnosis not present

## 2016-01-18 DIAGNOSIS — I480 Paroxysmal atrial fibrillation: Secondary | ICD-10-CM

## 2016-01-18 MED ORDER — METOPROLOL TARTRATE 50 MG PO TABS: 50.0000 mg | ORAL_TABLET | Freq: Two times a day (BID) | ORAL | 3 refills | Status: DC

## 2016-01-18 NOTE — Progress Notes (Signed)
Carelink Summary Report / Loop Recorder 

## 2016-01-18 NOTE — Patient Instructions (Signed)
Your physician has recommended you make the following change in your medication:  1)Increase metoprolol 50mg  twice a day  Scheduler will be in touch with you to schedule appointment in 3 months with Dr. [name].

## 2016-01-18 NOTE — Progress Notes (Signed)
PCP: Criselda Peaches, MD Primary Cardiologist:  Dr Wynelle Beckmann is a 70 y.o. male who presents today for electrophysiology followup.  Since his last visit, the patient reports doing very well.  He has occasional palpitations due to PACs.  More recently, he had afib 01/16/16 at 23:01 lasting 2 hours and 14 minutes.  He reports associated palpitations.  He took diltiazem and his afib resolved before waking. Today, he denies symptoms of chest pain, shortness of breath,  lower extremity edema,  presyncope, or syncope.  The patient is otherwise without complaint today.   Past Medical History:  Diagnosis Date  . Allergy    SEASONAL  . Arthritis    BACK AND KNEES  . GERD (gastroesophageal reflux disease)   . History of kidney stones 07/2006   x 1  . Hyperlipidemia   . Hypertension   . Normal coronary arteries 2008   Normal LV function  . Paroxysmal atrial fibrillation ([place])    2008 and 20014, PVI 09/07/12   Past Surgical History:  Procedure Laterality Date  . ATRIAL ABLATION SURGERY  09/07/12   PVI by Dr [name]  . ATRIAL FIBRILLATION ABLATION N/A 09/07/2012   Procedure: ATRIAL FIBRILLATION ABLATION;  Surgeon: Thompson Grayer, MD;  Location: [facility] CATH LAB;  Service: Cardiovascular;  Laterality: N/A;  . CARDIAC CATHETERIZATION Left 07/20/2006   No significant CAD, EF 60%, continue medical therapy  . COLONOSCOPY    . EP IMPLANTABLE DEVICE N/A 01/23/2015   Procedure: Loop Recorder Insertion;  Surgeon: Thompson Grayer, MD;  Location: [place] CV LAB;  Service: Cardiovascular;  Laterality: N/A;  . KNEE ARTHROSCOPY    . TEE WITHOUT CARDIOVERSION N/A 09/06/2012   Procedure: TRANSESOPHAGEAL ECHOCARDIOGRAM (TEE);  Surgeon: Pixie Casino, MD;  Location: [facility] ENDOSCOPY;  Service: Cardiovascular;  Laterality: N/A;  . UPPER GASTROINTESTINAL ENDOSCOPY      Current Outpatient Prescriptions  Medication Sig Dispense Refill  . diltiazem (CARDIZEM) 30 MG tablet Take 1 tablet (30 mg total) by mouth as  needed. 15 tablet 3  . metoprolol tartrate (LOPRESSOR) 50 MG tablet Take 1 tablet (50 mg total) by mouth 2 (two) times daily. 60 tablet 3  . omeprazole (PRILOSEC) 20 MG capsule Take 20 mg by mouth daily.    . rivaroxaban (XARELTO) 20 MG TABS tablet Take 1 tablet (20 mg total) by mouth daily. 90 tablet 2  . rosuvastatin (CRESTOR) 10 MG tablet Take 1 tablet (10 mg total) by mouth daily. 90 tablet 3  . Coenzyme Q10 (CO Q 10) 100 MG CAPS Take 200 mg by mouth daily. (Patient not taking: Reported on 01/18/2016) 30 capsule    No current facility-administered medications for this encounter.     Physical Exam: Vitals:   01/18/16 1010  BP: 140/84  BP Location: Left Arm  Patient Position: Sitting  Cuff Size: Normal  Pulse: 67  Weight: 176 lb 6.4 oz (80 kg)  Height: 5\' 11"  (1.803 m)    GEN- The patient is well appearing, alert and oriented x 3 today.   Head- normocephalic, atraumatic Eyes-  Sclera clear, conjunctiva pink Ears- hearing intact Oropharynx- clear Lungs- Clear to ausculation bilaterally, normal work of breathing Heart- Regular rate and rhythm, no murmurs, rubs or gallops, PMI not laterally displaced GI- soft, NT, ND, + BS Extremities- no clubbing, cyanosis, or edema  ekg today reveals sinus 63 bpm,  incomplete RBBB  Assessment and Plan:  1. afib Doing well s/p ablation,   ILR is reviewed and  reveals AF burden of 0.3%.  There is confirmed AF lasting 2 hours for which he was recently symptomatic Increase metoprolol to 50mg  BID today. Continue xarelto  Return to see me in 3 months  Thompson Grayer MD, [facility] 01/18/2016 12:36 PM

## 2016-02-18 ENCOUNTER — Ambulatory Visit (INDEPENDENT_AMBULATORY_CARE_PROVIDER_SITE_OTHER): Payer: PPO | Admitting: *Deleted

## 2016-02-18 DIAGNOSIS — I480 Paroxysmal atrial fibrillation: Secondary | ICD-10-CM

## 2016-02-18 LAB — CUP PACEART REMOTE DEVICE CHECK
Date Time Interrogation Session: 20171013150734
Implantable Pulse Generator Implant Date: 20161018

## 2016-02-18 NOTE — Progress Notes (Signed)
Carelink summary report received. Battery status OK. Normal device function. No new tachy episodes, brady, or pause episodes. 1 symptom- ECG previously reviewed. 3 AF 0.4%- ECGs previously reviewed except 1 which appears AF RVR. +Xarelto +cardizem Monthly summary reports and ROV/PRN

## 2016-02-19 NOTE — Progress Notes (Signed)
Carelink Summary Report / Loop Recorder 

## 2016-02-22 ENCOUNTER — Telehealth: Payer: Self-pay | Admitting: Internal Medicine

## 2016-02-22 NOTE — Telephone Encounter (Signed)
Informed patient that there hasn't been any episodes recorded on his ILR since his last appt on 10/13. Patient voiced understanding.

## 2016-02-22 NOTE — Telephone Encounter (Signed)
Patient wants to discuss the results of his latest remote device check

## 2016-03-06 DIAGNOSIS — D509 Iron deficiency anemia, unspecified: Secondary | ICD-10-CM | POA: Diagnosis not present

## 2016-03-06 DIAGNOSIS — R319 Hematuria, unspecified: Secondary | ICD-10-CM | POA: Diagnosis not present

## 2016-03-07 DIAGNOSIS — R319 Hematuria, unspecified: Secondary | ICD-10-CM | POA: Diagnosis not present

## 2016-03-11 DIAGNOSIS — R310 Gross hematuria: Secondary | ICD-10-CM | POA: Diagnosis not present

## 2016-03-18 ENCOUNTER — Ambulatory Visit (INDEPENDENT_AMBULATORY_CARE_PROVIDER_SITE_OTHER): Payer: PPO | Admitting: *Deleted

## 2016-03-18 DIAGNOSIS — I480 Paroxysmal atrial fibrillation: Secondary | ICD-10-CM | POA: Diagnosis not present

## 2016-03-18 NOTE — Progress Notes (Signed)
Carelink Summary Report / Loop Recorder 

## 2016-03-20 DIAGNOSIS — R310 Gross hematuria: Secondary | ICD-10-CM | POA: Diagnosis not present

## 2016-03-20 DIAGNOSIS — N200 Calculus of kidney: Secondary | ICD-10-CM | POA: Diagnosis not present

## 2016-03-25 DIAGNOSIS — N200 Calculus of kidney: Secondary | ICD-10-CM | POA: Diagnosis not present

## 2016-03-25 DIAGNOSIS — R310 Gross hematuria: Secondary | ICD-10-CM | POA: Diagnosis not present

## 2016-04-02 LAB — CUP PACEART REMOTE DEVICE CHECK
Implantable Pulse Generator Implant Date: 20161018
MDC IDC SESS DTM: 20171112153957

## 2016-04-04 DIAGNOSIS — M1712 Unilateral primary osteoarthritis, left knee: Secondary | ICD-10-CM | POA: Diagnosis not present

## 2016-04-04 DIAGNOSIS — M25561 Pain in right knee: Secondary | ICD-10-CM | POA: Diagnosis not present

## 2016-04-04 DIAGNOSIS — M170 Bilateral primary osteoarthritis of knee: Secondary | ICD-10-CM | POA: Diagnosis not present

## 2016-04-04 DIAGNOSIS — G8929 Other chronic pain: Secondary | ICD-10-CM | POA: Diagnosis not present

## 2016-04-04 DIAGNOSIS — M1711 Unilateral primary osteoarthritis, right knee: Secondary | ICD-10-CM | POA: Diagnosis not present

## 2016-04-14 ENCOUNTER — Other Ambulatory Visit: Payer: Self-pay | Admitting: *Deleted

## 2016-04-14 MED ORDER — METOPROLOL TARTRATE 50 MG PO TABS: 50.0000 mg | ORAL_TABLET | Freq: Two times a day (BID) | ORAL | 3 refills | Status: DC

## 2016-04-17 ENCOUNTER — Ambulatory Visit (INDEPENDENT_AMBULATORY_CARE_PROVIDER_SITE_OTHER): Payer: PPO | Admitting: *Deleted

## 2016-04-17 DIAGNOSIS — I480 Paroxysmal atrial fibrillation: Secondary | ICD-10-CM | POA: Diagnosis not present

## 2016-04-17 NOTE — Progress Notes (Signed)
Carelink Summary Report / Loop Recorder 

## 2016-04-23 ENCOUNTER — Encounter: Payer: PPO | Admitting: Internal Medicine

## 2016-04-26 DIAGNOSIS — M545 Low back pain: Secondary | ICD-10-CM | POA: Diagnosis not present

## 2016-05-01 ENCOUNTER — Encounter: Payer: Self-pay | Admitting: Internal Medicine

## 2016-05-05 ENCOUNTER — Telehealth: Payer: Self-pay | Admitting: Internal Medicine

## 2016-05-05 NOTE — Telephone Encounter (Signed)
New Message  Pt call requesting to speak with RN. Pt would like to make and appt with only Dr. [name] for a f/u appt. Pt would also like to know what the appt is for. Please call back to discuss

## 2016-05-05 NOTE — Telephone Encounter (Signed)
Spoke with patient and have him scheduled to see Dr [name] on 05/16/16 in the afib clinic

## 2016-05-06 DIAGNOSIS — M545 Low back pain: Secondary | ICD-10-CM | POA: Diagnosis not present

## 2016-05-08 DIAGNOSIS — G8918 Other acute postprocedural pain: Secondary | ICD-10-CM | POA: Diagnosis not present

## 2016-05-08 DIAGNOSIS — M23241 Derangement of anterior horn of lateral meniscus due to old tear or injury, right knee: Secondary | ICD-10-CM | POA: Diagnosis not present

## 2016-05-08 DIAGNOSIS — M6751 Plica syndrome, right knee: Secondary | ICD-10-CM | POA: Diagnosis not present

## 2016-05-08 LAB — CUP PACEART REMOTE DEVICE CHECK
Implantable Pulse Generator Implant Date: 20161018
MDC IDC SESS DTM: 20171212163556

## 2016-05-08 NOTE — Progress Notes (Signed)
Carelink summary report received. Battery status OK. Normal device function. No new symptom episodes, tachy episodes, brady, or pause episodes. No new AF episodes. Monthly summary reports and ROV/PRN 

## 2016-05-15 ENCOUNTER — Encounter: Payer: Self-pay | Admitting: Gastroenterology

## 2016-05-16 ENCOUNTER — Encounter (HOSPITAL_COMMUNITY): Payer: Self-pay | Admitting: Nurse Practitioner

## 2016-05-16 ENCOUNTER — Ambulatory Visit (HOSPITAL_COMMUNITY)
Admission: RE | Admit: 2016-05-16 | Discharge: 2016-05-16 | Disposition: A | Discharge status: Home / Self Care | Payer: PPO | Source: Ambulatory Visit | Attending: Nurse Practitioner | Admitting: Nurse Practitioner

## 2016-05-16 ENCOUNTER — Encounter: Payer: PPO | Admitting: Internal Medicine

## 2016-05-16 VITALS — BP 152/78 | HR 68 | Ht 71.0 in | Wt 178.0 lb

## 2016-05-16 DIAGNOSIS — K219 Gastro-esophageal reflux disease without esophagitis: Secondary | ICD-10-CM | POA: Diagnosis not present

## 2016-05-16 DIAGNOSIS — E785 Hyperlipidemia, unspecified: Secondary | ICD-10-CM | POA: Insufficient documentation

## 2016-05-16 DIAGNOSIS — I10 Essential (primary) hypertension: Secondary | ICD-10-CM | POA: Insufficient documentation

## 2016-05-16 DIAGNOSIS — I480 Paroxysmal atrial fibrillation: Secondary | ICD-10-CM | POA: Insufficient documentation

## 2016-05-16 DIAGNOSIS — Z7901 Long term (current) use of anticoagulants: Secondary | ICD-10-CM | POA: Diagnosis not present

## 2016-05-16 NOTE — Progress Notes (Signed)
PCP: Criselda Peaches, MD Primary Cardiologist:  Dr Wynelle Beckmann is a 71 y.o. male who presents today for electrophysiology followup.  Since his last visit, the patient reports doing very well.  He has not had any afib since October.  He did recently have hematuria for which he had an uneventful urology evaluation.  Today, he denies symptoms of chest pain, shortness of breath,  lower extremity edema,  presyncope, or syncope.  The patient is otherwise without complaint today.   Past Medical History:  Diagnosis Date  . Allergy    SEASONAL  . Arthritis    BACK AND KNEES  . GERD (gastroesophageal reflux disease)   . History of kidney stones 07/2006   x 1  . Hyperlipidemia   . Hypertension   . Normal coronary arteries 2008   Normal LV function  . Paroxysmal atrial fibrillation ([place])    2008 and 20014, PVI 09/07/12   Past Surgical History:  Procedure Laterality Date  . ATRIAL ABLATION SURGERY  09/07/12   PVI by Dr [name]  . ATRIAL FIBRILLATION ABLATION N/A 09/07/2012   Procedure: ATRIAL FIBRILLATION ABLATION;  Surgeon: Thompson Grayer, MD;  Location: [facility] CATH LAB;  Service: Cardiovascular;  Laterality: N/A;  . CARDIAC CATHETERIZATION Left 07/20/2006   No significant CAD, EF 60%, continue medical therapy  . COLONOSCOPY    . EP IMPLANTABLE DEVICE N/A 01/23/2015   Procedure: Loop Recorder Insertion;  Surgeon: Thompson Grayer, MD;  Location: [facility] CV LAB;  Service: Cardiovascular;  Laterality: N/A;  . KNEE ARTHROSCOPY    . TEE WITHOUT CARDIOVERSION N/A 09/06/2012   Procedure: TRANSESOPHAGEAL ECHOCARDIOGRAM (TEE);  Surgeon: Pixie Casino, MD;  Location: [facility] ENDOSCOPY;  Service: Cardiovascular;  Laterality: N/A;  . UPPER GASTROINTESTINAL ENDOSCOPY      Current Outpatient Prescriptions  Medication Sig Dispense Refill  . diltiazem (CARDIZEM) 30 MG tablet Take 1 tablet (30 mg total) by mouth as needed. 15 tablet 3  . metoprolol (LOPRESSOR) 50 MG tablet Take 1 tablet (50 mg total) by  mouth 2 (two) times daily. 180 tablet 3  . omeprazole (PRILOSEC) 20 MG capsule Take 20 mg by mouth daily.    . rivaroxaban (XARELTO) 20 MG TABS tablet Take 1 tablet (20 mg total) by mouth daily. 90 tablet 2  . rosuvastatin (CRESTOR) 10 MG tablet Take 1 tablet (10 mg total) by mouth daily. 90 tablet 3   No current facility-administered medications for this encounter.     Physical Exam: Vitals:   05/16/16 1422  BP: (!) 152/78  BP Location: Left Arm  Patient Position: Sitting  Cuff Size: Normal  Pulse: 68  Weight: 178 lb (80.7 kg)  Height: 5\' 11"  (1.803 m)    GEN- The patient is well appearing, alert and oriented x 3 today.   Head- normocephalic, atraumatic Eyes-  Sclera clear, conjunctiva pink Ears- hearing intact Oropharynx- clear Lungs- Clear to ausculation bilaterally, normal work of breathing Heart- Regular rate and rhythm, no murmurs, rubs or gallops, PMI not laterally displaced GI- soft, NT, ND, + BS Extremities- no clubbing, cyanosis, or edema  ekg today reveals sinus 68 bpm,  incomplete RBBB  Assessment and Plan:  1. afib Doing well s/p ablation,   ILR is reviewed and reveals no afib.  Today, I discussed pros and cons of anticoagulation.  Given chads2vasc score of 2 guidelines would support anticoagulation however his af burden is near 0% chronically.  At this time, he is clear that he wishes to  continue anticoagulation. No changes today  I will follow with [facility] Return to see Dr Gwenlyn Found as scheduled I will see as needed  Thompson Grayer MD, [facility] 05/16/2016 2:59 PM

## 2016-05-19 ENCOUNTER — Ambulatory Visit (INDEPENDENT_AMBULATORY_CARE_PROVIDER_SITE_OTHER): Payer: PPO | Admitting: *Deleted

## 2016-05-19 DIAGNOSIS — I480 Paroxysmal atrial fibrillation: Secondary | ICD-10-CM | POA: Diagnosis not present

## 2016-05-20 DIAGNOSIS — M545 Low back pain: Secondary | ICD-10-CM | POA: Diagnosis not present

## 2016-05-20 NOTE — Progress Notes (Signed)
Carelink Summary Report / Loop Recorder 

## 2016-05-28 LAB — CUP PACEART REMOTE DEVICE CHECK
Date Time Interrogation Session: 20180111163741
MDC IDC PG IMPLANT DT: 20161018

## 2016-06-16 ENCOUNTER — Ambulatory Visit (INDEPENDENT_AMBULATORY_CARE_PROVIDER_SITE_OTHER): Payer: PPO | Admitting: *Deleted

## 2016-06-16 DIAGNOSIS — I480 Paroxysmal atrial fibrillation: Secondary | ICD-10-CM

## 2016-06-16 LAB — CUP PACEART REMOTE DEVICE CHECK
Date Time Interrogation Session: 20180210164104
Implantable Pulse Generator Implant Date: 20161018

## 2016-06-17 NOTE — Progress Notes (Signed)
Carelink Summary Report / Loop Recorder 

## 2016-06-22 LAB — CUP PACEART REMOTE DEVICE CHECK
Implantable Pulse Generator Implant Date: 20161018
MDC IDC SESS DTM: 20180312163915

## 2016-06-22 NOTE — Progress Notes (Signed)
[facility] summary report received. Battery status OK. Normal device function. No tachy episodes, brady, or pause episodes. No new AF episodes. 1 symptom- ECG appears SR w/ PACs. Monthly summary reports and ROV/PRN

## 2016-07-16 ENCOUNTER — Ambulatory Visit (INDEPENDENT_AMBULATORY_CARE_PROVIDER_SITE_OTHER): Payer: PPO | Admitting: *Deleted

## 2016-07-16 DIAGNOSIS — I480 Paroxysmal atrial fibrillation: Secondary | ICD-10-CM | POA: Diagnosis not present

## 2016-07-16 LAB — CUP PACEART REMOTE DEVICE CHECK
Date Time Interrogation Session: 20180411174016
MDC IDC PG IMPLANT DT: 20161018

## 2016-07-16 NOTE — Progress Notes (Signed)
Carelink Summary Report / Loop Recorder 

## 2016-07-24 DIAGNOSIS — M1711 Unilateral primary osteoarthritis, right knee: Secondary | ICD-10-CM | POA: Diagnosis not present

## 2016-07-24 DIAGNOSIS — M25561 Pain in right knee: Secondary | ICD-10-CM | POA: Diagnosis not present

## 2016-07-24 DIAGNOSIS — M25562 Pain in left knee: Secondary | ICD-10-CM | POA: Diagnosis not present

## 2016-07-24 DIAGNOSIS — M170 Bilateral primary osteoarthritis of knee: Secondary | ICD-10-CM | POA: Diagnosis not present

## 2016-07-24 DIAGNOSIS — M1712 Unilateral primary osteoarthritis, left knee: Secondary | ICD-10-CM | POA: Diagnosis not present

## 2016-08-15 ENCOUNTER — Ambulatory Visit (INDEPENDENT_AMBULATORY_CARE_PROVIDER_SITE_OTHER): Payer: PPO | Admitting: *Deleted

## 2016-08-15 DIAGNOSIS — I480 Paroxysmal atrial fibrillation: Secondary | ICD-10-CM

## 2016-08-18 NOTE — Progress Notes (Signed)
Carelink Summary Report / Loop Recorder 

## 2016-08-19 ENCOUNTER — Encounter: Payer: Self-pay | Admitting: Cardiovascular Disease

## 2016-08-19 ENCOUNTER — Ambulatory Visit (INDEPENDENT_AMBULATORY_CARE_PROVIDER_SITE_OTHER): Payer: PPO | Admitting: Cardiovascular Disease

## 2016-08-19 VITALS — BP 156/76 | HR 64 | Ht 71.0 in | Wt 176.6 lb

## 2016-08-19 DIAGNOSIS — I480 Paroxysmal atrial fibrillation: Secondary | ICD-10-CM

## 2016-08-19 DIAGNOSIS — I10 Essential (primary) hypertension: Secondary | ICD-10-CM | POA: Diagnosis not present

## 2016-08-19 NOTE — Progress Notes (Signed)
08/19/2016 Luis [name]   Aug 09, 1945  [id number]  Primary Physician Levin Erp, MD Primary Cardiologist: Lorretta Harp MD Lupe Carney, [place]  HPI:   The patient is a very pleasant 71 year old, thin and fit-appearing, married Caucasian male, father of 66, grandfather to 2 grandchildren who I last saw him in the office 08/09/15. He has a history of paroxysmal atrial fibrillation maintaining sinus rhythm when I saw him in December. He has been evaluated by [name] at [facility] remotely back in May 2008 and was cath'd by Dr. [name] the month before revealing noncritical CAD with normal LV function. He does have a 35-pack-year history of tobacco abuse having quit August 1996. His other problems include history of hypertension, mild hyperlipidemia and family history of heart disease with a father who had an MI at age 67 and a brother who had stent placed at age 55. He did get occasional palpitations when I saw him back in December. He has had 2 episodes of symptomatic A-fib, the last of which occurred on June 19, 2012, which resulted in presyncope. He noticed this while he was playing golf. When he presented to the ER his heart rate was 144. He was placed on IV diltiazem and ultimately converted to sinus rhythm and was discharged home on flecainide 50 mg p.o. b.i.d. Because his CHADS2 score was 1 it was elected to keep him on aspirin.he underwent atrial fibrillation ablation by [name] on 09/07/12 with an excellent clinical result. He's had no recurrent episodes appear PAF. His Flecainide was discontinued. He is on Xarelto oral anticoagulation. He denies chest pain or shortness of breath. He had an abdominal duplex that showed no evidence of aneurysm. E is on Crestor with recent lipid profile performed by his PCP 12/13/14 revealed a total cholesterol 133, LDL 67 and HDL of 44. He had an implantable loop recorder placed by Dr. [name] in October 2016.[name] He's had one episode of breakthrough  PAF since his ablation 3 years ago. Since I saw him a year ago his A. fib burden is 0. He's had no breakthrough episodes. He denies chest pain or shortness of breath.    Current Outpatient Prescriptions  Medication Sig Dispense Refill  . diltiazem (CARDIZEM) 30 MG tablet Take 1 tablet (30 mg total) by mouth as needed. 15 tablet 3  . metoprolol (LOPRESSOR) 50 MG tablet Take 1 tablet (50 mg total) by mouth 2 (two) times daily. 180 tablet 3  . omeprazole (PRILOSEC) 20 MG capsule Take 20 mg by mouth daily.    . rivaroxaban (XARELTO) 20 MG TABS tablet Take 1 tablet (20 mg total) by mouth daily. 90 tablet 2  . rosuvastatin (CRESTOR) 10 MG tablet Take 1 tablet (10 mg total) by mouth daily. 90 tablet 3   No current facility-administered medications for this visit.     No Known Allergies  Social History   Social History  . Marital status: Married    Spouse name: N/A  . Number of children: N/A  . Years of education: N/A   Occupational History  . Not on file.   Social History Main Topics  . Smoking status: Former Smoker    Years: 35.00    Types: Cigarettes    Quit date: 11/27/1994  . Smokeless tobacco: Never Used  . Alcohol use 3.5 oz/week    7 drink(s) per week     Comment: 3-4 oz daily  . Drug use: No  . Sexual activity: Not  on file   Other Topics Concern  . Not on file   Social History Narrative   Lives in [place] with spouse.   Retired Electrical engineer     Review of Systems: General: negative for chills, fever, night sweats or weight changes.  Cardiovascular: negative for chest pain, dyspnea on exertion, edema, orthopnea, palpitations, paroxysmal nocturnal dyspnea or shortness of breath Dermatological: negative for rash Respiratory: negative for cough or wheezing Urologic: negative for hematuria Abdominal: negative for nausea, vomiting, diarrhea, bright red blood per rectum, melena, or hematemesis Neurologic: negative for visual changes, syncope, or  dizziness All other systems reviewed and are otherwise negative except as noted above.    Blood pressure (!) 156/76, pulse 64, height 5\' 11"  (1.803 m), weight 176 lb 9.6 oz (80.1 kg).  General appearance: alert and no distress Neck: no adenopathy, no carotid bruit, no JVD, supple, symmetrical, trachea midline and thyroid not enlarged, symmetric, no tenderness/mass/nodules Lungs: clear to auscultation bilaterally Heart: regular rate and rhythm, S1, S2 normal, no murmur, click, rub or gallop Extremities: extremities normal, atraumatic, no cyanosis or edema  EKG sinus rhythm at 64 without ST or T-wave changes. I personally reviewed the EKG  ASSESSMENT AND PLAN:   HYPERLIPIDEMIA History of hyperlipidemia on statin therapy with lipid profile performed by his PCP 12/13/15 revealing total cholesterol of 145, LDL 71 and HDL of 58.  Essential hypertension History of essential hypertension blood pressure measured at 156/76. His blood pressure slowly inching up over time. He is on metoprolol. Continue current meds at current dosing. The blood pressure continues to rise he may benefit from addition of a low-dose ace inhibitor.  Paroxysmal atrial fibrillation- RFA June 2014 History of paroxysmal atrial fibrillation status post A. fib ablation by Dr. [name] 09/07/12. He did have a loop recorder implanted October 2016 followed monthly. His A. fib burden is 0. He is on Xarelto  oral anticoagulation.      Lorretta Harp MD FACP,FACC,FAHA, [facility] 08/19/2016 10:06 AM

## 2016-08-19 NOTE — Assessment & Plan Note (Signed)
History of paroxysmal atrial fibrillation status post A. fib ablation by Dr. [name] 09/07/12. He did have a loop recorder implanted October 2016 followed monthly. His A. fib burden is 0. He is on Xarelto  oral anticoagulation.

## 2016-08-19 NOTE — Patient Instructions (Signed)
Your physician wants you to follow-up in: 1 year with Dr Berry. You will receive a reminder letter in the mail two months in advance. If you don't receive a letter, please call our office to schedule the follow-up appointment.  

## 2016-08-19 NOTE — Assessment & Plan Note (Signed)
History of hyperlipidemia on statin therapy with lipid profile performed by his PCP 12/13/15 revealing total cholesterol of 145, LDL 71 and HDL of 58.

## 2016-08-19 NOTE — Assessment & Plan Note (Signed)
History of essential hypertension blood pressure measured at 156/76. His blood pressure slowly inching up over time. He is on metoprolol. Continue current meds at current dosing. The blood pressure continues to rise he may benefit from addition of a low-dose ace inhibitor.

## 2016-08-21 LAB — CUP PACEART REMOTE DEVICE CHECK
Implantable Pulse Generator Implant Date: 20161018
MDC IDC SESS DTM: 20180511180735

## 2016-09-15 ENCOUNTER — Ambulatory Visit (INDEPENDENT_AMBULATORY_CARE_PROVIDER_SITE_OTHER): Payer: PPO | Admitting: *Deleted

## 2016-09-15 DIAGNOSIS — I480 Paroxysmal atrial fibrillation: Secondary | ICD-10-CM

## 2016-09-15 NOTE — Progress Notes (Signed)
Carelink Summary Report / Loop Recorder 

## 2016-09-23 DIAGNOSIS — N200 Calculus of kidney: Secondary | ICD-10-CM | POA: Diagnosis not present

## 2016-09-23 DIAGNOSIS — N486 Induration penis plastica: Secondary | ICD-10-CM | POA: Diagnosis not present

## 2016-09-23 DIAGNOSIS — N420 Calculus of prostate: Secondary | ICD-10-CM | POA: Diagnosis not present

## 2016-09-23 LAB — CUP PACEART REMOTE DEVICE CHECK
Date Time Interrogation Session: 20180610181204
MDC IDC PG IMPLANT DT: 20161018

## 2016-10-06 ENCOUNTER — Other Ambulatory Visit: Payer: Self-pay | Admitting: Cardiovascular Disease

## 2016-10-14 ENCOUNTER — Ambulatory Visit (INDEPENDENT_AMBULATORY_CARE_PROVIDER_SITE_OTHER): Payer: PPO | Admitting: *Deleted

## 2016-10-14 DIAGNOSIS — I480 Paroxysmal atrial fibrillation: Secondary | ICD-10-CM | POA: Diagnosis not present

## 2016-10-15 NOTE — Progress Notes (Signed)
Carelink Summary Report / Loop Recorder 

## 2016-10-24 LAB — CUP PACEART REMOTE DEVICE CHECK
Implantable Pulse Generator Implant Date: 20161018
MDC IDC SESS DTM: 20180710201028

## 2016-11-03 ENCOUNTER — Other Ambulatory Visit: Payer: Self-pay | Admitting: Cardiovascular Disease

## 2016-11-13 ENCOUNTER — Ambulatory Visit (INDEPENDENT_AMBULATORY_CARE_PROVIDER_SITE_OTHER): Payer: PPO | Admitting: *Deleted

## 2016-11-13 DIAGNOSIS — I480 Paroxysmal atrial fibrillation: Secondary | ICD-10-CM | POA: Diagnosis not present

## 2016-11-13 NOTE — Progress Notes (Signed)
Carelink Summary Report / Loop Recorder 

## 2016-11-20 LAB — CUP PACEART REMOTE DEVICE CHECK
Implantable Pulse Generator Implant Date: 20161018
MDC IDC SESS DTM: 20180809203755

## 2016-12-12 DIAGNOSIS — I4891 Unspecified atrial fibrillation: Secondary | ICD-10-CM | POA: Diagnosis not present

## 2016-12-12 DIAGNOSIS — I10 Essential (primary) hypertension: Secondary | ICD-10-CM | POA: Diagnosis not present

## 2016-12-12 DIAGNOSIS — Z6841 Body Mass Index (BMI) 40.0 and over, adult: Secondary | ICD-10-CM | POA: Diagnosis not present

## 2016-12-12 DIAGNOSIS — Z0000 Encounter for general adult medical examination without abnormal findings: Secondary | ICD-10-CM | POA: Diagnosis not present

## 2016-12-12 DIAGNOSIS — Z23 Encounter for immunization: Secondary | ICD-10-CM | POA: Diagnosis not present

## 2016-12-12 DIAGNOSIS — N200 Calculus of kidney: Secondary | ICD-10-CM | POA: Diagnosis not present

## 2016-12-12 DIAGNOSIS — D45 Polycythemia vera: Secondary | ICD-10-CM | POA: Diagnosis not present

## 2016-12-15 ENCOUNTER — Ambulatory Visit (INDEPENDENT_AMBULATORY_CARE_PROVIDER_SITE_OTHER): Payer: PPO | Admitting: *Deleted

## 2016-12-15 DIAGNOSIS — I480 Paroxysmal atrial fibrillation: Secondary | ICD-10-CM | POA: Diagnosis not present

## 2016-12-16 NOTE — Progress Notes (Signed)
Carelink Summary Report / Loop Recorder 

## 2016-12-18 LAB — CUP PACEART REMOTE DEVICE CHECK
Date Time Interrogation Session: 20180908203916
MDC IDC PG IMPLANT DT: 20161018

## 2016-12-29 ENCOUNTER — Other Ambulatory Visit: Payer: Self-pay | Admitting: Cardiovascular Disease

## 2016-12-31 ENCOUNTER — Telehealth: Payer: Self-pay | Admitting: Cardiovascular Disease

## 2016-12-31 NOTE — Telephone Encounter (Signed)
Returned call to patient office out of Xarelto samples.

## 2016-12-31 NOTE — Telephone Encounter (Signed)
New message    Pt is calling about his medication.   Pt c/o medication issue:  1. Name of Medication: Xarelto   2. How are you currently taking this medication (dosage and times per day)? 20 mg  3. Are you having a reaction (difficulty breathing--STAT)? no  4. What is your medication issue? Pt states he went to pick up his medication and it was over 400 dollars.   Patient calling the office for samples of medication:   1.  What medication and dosage are you requesting samples for? xarelto 20 mg  2.  Are you currently out of this medication? He will be in 3 or 4 days

## 2017-01-01 ENCOUNTER — Telehealth: Payer: Self-pay

## 2017-01-01 NOTE — Telephone Encounter (Signed)
Spoke to patient Xarelto 20 mg samples left at [employer] office front desk.

## 2017-01-12 ENCOUNTER — Ambulatory Visit (INDEPENDENT_AMBULATORY_CARE_PROVIDER_SITE_OTHER): Payer: PPO | Admitting: *Deleted

## 2017-01-12 DIAGNOSIS — I480 Paroxysmal atrial fibrillation: Secondary | ICD-10-CM

## 2017-01-13 NOTE — Progress Notes (Signed)
Carelink Summary Report / Loop Recorder 

## 2017-01-15 LAB — CUP PACEART REMOTE DEVICE CHECK
Date Time Interrogation Session: 20181008204124
Implantable Pulse Generator Implant Date: 20161018

## 2017-01-26 ENCOUNTER — Telehealth: Payer: Self-pay | Admitting: Internal Medicine

## 2017-01-26 NOTE — Telephone Encounter (Signed)
New message    Patient calling the office for samples of medication:   1.  What medication and dosage are you requesting samples for? Xarelto 20 mg  2.  Are you currently out of this medication? Pt has 3 days left.

## 2017-01-27 NOTE — Telephone Encounter (Signed)
Refill pool

## 2017-02-11 ENCOUNTER — Ambulatory Visit (INDEPENDENT_AMBULATORY_CARE_PROVIDER_SITE_OTHER): Payer: PPO | Admitting: *Deleted

## 2017-02-11 DIAGNOSIS — I480 Paroxysmal atrial fibrillation: Secondary | ICD-10-CM

## 2017-02-12 LAB — CUP PACEART REMOTE DEVICE CHECK
Date Time Interrogation Session: 20181107211213
MDC IDC PG IMPLANT DT: 20161018

## 2017-02-12 NOTE — Progress Notes (Signed)
Carelink Summary Report / Loop Recorder 

## 2017-02-18 ENCOUNTER — Other Ambulatory Visit: Payer: Self-pay | Admitting: Internal Medicine

## 2017-02-18 NOTE — Telephone Encounter (Signed)
Returned call to patient Xarelto 20 mg samples left at [employer] office front desk. 

## 2017-02-18 NOTE — Telephone Encounter (Signed)
Patient calling the office for samples of medication:   1.  What medication and dosage are you requesting samples for? Xarelto  2.  Are you currently out of this medication? Enough for this week

## 2017-02-18 NOTE — Telephone Encounter (Signed)
Samples go to triage not cvrr

## 2017-03-10 ENCOUNTER — Telehealth: Payer: Self-pay | Admitting: Cardiovascular Disease

## 2017-03-10 NOTE — Telephone Encounter (Signed)
Returned call to patient Xarelto 20 mg samples left at [employer] office front desk. 

## 2017-03-10 NOTE — Telephone Encounter (Signed)
New Message     Patient calling the office for samples of medication:   1.  What medication and dosage are you requesting samples for? 20mg  Xarelto 2.  Are you currently out of this medication? Enough to get him to saturday

## 2017-03-13 ENCOUNTER — Ambulatory Visit (INDEPENDENT_AMBULATORY_CARE_PROVIDER_SITE_OTHER): Payer: PPO | Admitting: *Deleted

## 2017-03-13 DIAGNOSIS — I480 Paroxysmal atrial fibrillation: Secondary | ICD-10-CM

## 2017-03-16 NOTE — Progress Notes (Signed)
Carelink Summary Report / Loop Recorder 

## 2017-03-23 DIAGNOSIS — M25561 Pain in right knee: Secondary | ICD-10-CM | POA: Diagnosis not present

## 2017-03-23 DIAGNOSIS — M25562 Pain in left knee: Secondary | ICD-10-CM | POA: Diagnosis not present

## 2017-03-23 DIAGNOSIS — G8929 Other chronic pain: Secondary | ICD-10-CM | POA: Diagnosis not present

## 2017-03-23 DIAGNOSIS — M1712 Unilateral primary osteoarthritis, left knee: Secondary | ICD-10-CM | POA: Diagnosis not present

## 2017-03-23 DIAGNOSIS — M170 Bilateral primary osteoarthritis of knee: Secondary | ICD-10-CM | POA: Diagnosis not present

## 2017-03-23 DIAGNOSIS — M1711 Unilateral primary osteoarthritis, right knee: Secondary | ICD-10-CM | POA: Diagnosis not present

## 2017-03-23 LAB — CUP PACEART REMOTE DEVICE CHECK
Implantable Pulse Generator Implant Date: 20161018
MDC IDC SESS DTM: 20181207213856

## 2017-04-13 ENCOUNTER — Ambulatory Visit (INDEPENDENT_AMBULATORY_CARE_PROVIDER_SITE_OTHER): Payer: PPO | Admitting: *Deleted

## 2017-04-13 DIAGNOSIS — I480 Paroxysmal atrial fibrillation: Secondary | ICD-10-CM

## 2017-04-13 NOTE — Progress Notes (Signed)
Carelink Summary Report / Loop Recorder 

## 2017-04-24 LAB — CUP PACEART REMOTE DEVICE CHECK
Date Time Interrogation Session: 20190106223914
MDC IDC PG IMPLANT DT: 20161018

## 2017-04-30 DIAGNOSIS — R310 Gross hematuria: Secondary | ICD-10-CM | POA: Diagnosis not present

## 2017-04-30 DIAGNOSIS — N200 Calculus of kidney: Secondary | ICD-10-CM | POA: Diagnosis not present

## 2017-05-12 ENCOUNTER — Ambulatory Visit (INDEPENDENT_AMBULATORY_CARE_PROVIDER_SITE_OTHER): Payer: PPO | Admitting: *Deleted

## 2017-05-12 DIAGNOSIS — I480 Paroxysmal atrial fibrillation: Secondary | ICD-10-CM | POA: Diagnosis not present

## 2017-05-13 NOTE — Progress Notes (Signed)
Carelink Summary Report / Loop Recorder 

## 2017-06-05 LAB — CUP PACEART REMOTE DEVICE CHECK
Implantable Pulse Generator Implant Date: 20161018
MDC IDC SESS DTM: 20190205231030

## 2017-06-12 ENCOUNTER — Telehealth: Payer: Self-pay | Admitting: Internal Medicine

## 2017-06-12 NOTE — Telephone Encounter (Signed)
Spoke with pt and explained there results of the transmission that resulted from 05/12/17. Informed pt that he was due to see Dr. [name] and that I would call back with some dates and times for him to schedule an apt pt voiced understanding.

## 2017-06-12 NOTE — Telephone Encounter (Signed)
°  1. Has your device fired? no  2. Is you device beeping? no  3. Are you experiencing draining or swelling at device site? no  4. Are you calling to see if we received your device transmission?no  5. Have you passed out? No  Patient calling and requested to speak with Luis [name] in regards to a "test result he received from system."    Please route to [place]

## 2017-06-12 NOTE — Telephone Encounter (Signed)
Pt agreeable to apt at 8:45am on 07/06/2017 for recall with JA.

## 2017-06-14 ENCOUNTER — Other Ambulatory Visit: Payer: Self-pay | Admitting: Internal Medicine

## 2017-06-15 ENCOUNTER — Ambulatory Visit (INDEPENDENT_AMBULATORY_CARE_PROVIDER_SITE_OTHER): Payer: PPO | Admitting: *Deleted

## 2017-06-15 DIAGNOSIS — I480 Paroxysmal atrial fibrillation: Secondary | ICD-10-CM | POA: Diagnosis not present

## 2017-06-15 NOTE — Progress Notes (Signed)
Carelink Summary Report / Loop Recorder 

## 2017-07-06 ENCOUNTER — Encounter: Payer: Self-pay | Admitting: Internal Medicine

## 2017-07-06 ENCOUNTER — Ambulatory Visit: Payer: PPO | Admitting: Internal Medicine

## 2017-07-06 VITALS — BP 142/96 | HR 64 | Ht 71.0 in | Wt 178.0 lb

## 2017-07-06 DIAGNOSIS — I480 Paroxysmal atrial fibrillation: Secondary | ICD-10-CM

## 2017-07-06 NOTE — Patient Instructions (Signed)

## 2017-07-06 NOTE — Progress Notes (Signed)
PCP: Levin Erp, MD Primary Cardiologist: Dr Gwenlyn Found Primary EP: Dr [name]  Luis [name] is a 72 y.o. male who presents today for routine electrophysiology followup.  Since last being seen in our clinic, the patient reports doing very well.  Today, he denies symptoms of palpitations, chest pain, shortness of breath,  lower extremity edema, dizziness, presyncope, or syncope.  The patient is otherwise without complaint today.   Past Medical History:  Diagnosis Date  . Allergy    SEASONAL  . Arthritis    BACK AND KNEES  . GERD (gastroesophageal reflux disease)   . History of kidney stones 07/2006   x 1  . Hyperlipidemia   . Hypertension   . Normal coronary arteries 2008   Normal LV function  . Paroxysmal atrial fibrillation ([place])    2008 and 20014, PVI 09/07/12   Past Surgical History:  Procedure Laterality Date  . ATRIAL ABLATION SURGERY  09/07/12   PVI by Dr [name]  . ATRIAL FIBRILLATION ABLATION N/A 09/07/2012   Procedure: ATRIAL FIBRILLATION ABLATION;  Surgeon: Thompson Grayer, MD;  Location: [facility] CATH LAB;  Service: Cardiovascular;  Laterality: N/A;  . CARDIAC CATHETERIZATION Left 07/20/2006   No significant CAD, EF 60%, continue medical therapy  . COLONOSCOPY    . EP IMPLANTABLE DEVICE N/A 01/23/2015   Procedure: Loop Recorder Insertion;  Surgeon: Thompson Grayer, MD;  Location: [facility] CV LAB;  Service: Cardiovascular;  Laterality: N/A;  . KNEE ARTHROSCOPY    . TEE WITHOUT CARDIOVERSION N/A 09/06/2012   Procedure: TRANSESOPHAGEAL ECHOCARDIOGRAM (TEE);  Surgeon: Pixie Casino, MD;  Location: [facility] ENDOSCOPY;  Service: Cardiovascular;  Laterality: N/A;  . UPPER GASTROINTESTINAL ENDOSCOPY      ROS- all systems are reviewed and negatives except as per HPI above  Current Outpatient Medications  Medication Sig Dispense Refill  . diltiazem (CARDIZEM) 30 MG tablet Take 1 tablet (30 mg total) by mouth as needed. 15 tablet 3  . metoprolol tartrate (LOPRESSOR) 50 MG tablet TAKE ONE  TABLET BY MOUTH TWICE DAILY. 180 tablet 0  . omeprazole (PRILOSEC) 20 MG capsule Take 20 mg by mouth daily.    . rivaroxaban (XARELTO) 20 MG TABS tablet Take 1 tablet (20 mg total) by mouth daily with supper. 90 tablet 1  . rosuvastatin (CRESTOR) 10 MG tablet TAKE ONE TABLET BY MOUTH ONCE DAILY 90 tablet 3   No current facility-administered medications for this visit.     Physical Exam: Vitals:   07/06/17 0852  BP: (!) 142/96  Pulse: 64  Weight: 80.7 kg (178 lb)  Height: 5\' 11"  (1.803 m)    GEN- The patient is well appearing, alert and oriented x 3 today.   Head- normocephalic, atraumatic Eyes-  Sclera clear, conjunctiva pink Ears- hearing intact Oropharynx- clear Lungs- Clear to ausculation bilaterally, normal work of breathing Heart- Regular rate and rhythm, no murmurs, rubs or gallops, PMI not laterally displaced GI- soft, NT, ND, + BS Extremities- no clubbing, cyanosis, or edema  EKG tracing ordered today is personally reviewed and shows sinus rhythm, incomplete RBBB  Assessment and Plan:  1. Paroxysmal atrial fibrillation Well controlled by ILR interrogation post ablation No recent episodes of afib. chads2vasc score is 2.  He reports many males in his family having strokes.  He is very clear that he would like to continue anticoagulation even with low AF burden  2. HTN Stable No change required today  Follow-up with Dr Gwenlyn Found as scheduled [facility] Return in a year  Luis [name]  Luis Hosterman MD, [facility] 07/06/2017 3:27 PM

## 2017-07-17 ENCOUNTER — Ambulatory Visit (INDEPENDENT_AMBULATORY_CARE_PROVIDER_SITE_OTHER): Payer: PPO | Admitting: *Deleted

## 2017-07-17 DIAGNOSIS — I480 Paroxysmal atrial fibrillation: Secondary | ICD-10-CM | POA: Diagnosis not present

## 2017-07-20 NOTE — Progress Notes (Signed)
Carelink Summary Report / Loop Recorder 

## 2017-07-23 LAB — CUP PACEART REMOTE DEVICE CHECK
Date Time Interrogation Session: 20190311001007
Implantable Pulse Generator Implant Date: 20161018

## 2017-08-03 DIAGNOSIS — H2513 Age-related nuclear cataract, bilateral: Secondary | ICD-10-CM | POA: Diagnosis not present

## 2017-08-03 DIAGNOSIS — H31092 Other chorioretinal scars, left eye: Secondary | ICD-10-CM | POA: Diagnosis not present

## 2017-08-19 ENCOUNTER — Ambulatory Visit (INDEPENDENT_AMBULATORY_CARE_PROVIDER_SITE_OTHER): Payer: PPO | Admitting: *Deleted

## 2017-08-19 DIAGNOSIS — I480 Paroxysmal atrial fibrillation: Secondary | ICD-10-CM | POA: Diagnosis not present

## 2017-08-19 LAB — CUP PACEART REMOTE DEVICE CHECK
Implantable Pulse Generator Implant Date: 20161018
MDC IDC SESS DTM: 20190413003605

## 2017-08-20 NOTE — Progress Notes (Signed)
Carelink Summary Report / Loop Recorder 

## 2017-08-24 ENCOUNTER — Other Ambulatory Visit: Payer: Self-pay | Admitting: Cardiovascular Disease

## 2017-09-01 ENCOUNTER — Ambulatory Visit: Payer: PPO | Admitting: Cardiovascular Disease

## 2017-09-01 ENCOUNTER — Encounter: Payer: Self-pay | Admitting: Cardiovascular Disease

## 2017-09-01 DIAGNOSIS — I10 Essential (primary) hypertension: Secondary | ICD-10-CM

## 2017-09-01 DIAGNOSIS — I480 Paroxysmal atrial fibrillation: Secondary | ICD-10-CM

## 2017-09-01 NOTE — Assessment & Plan Note (Signed)
History of hyperlipidemia on statin therapy followed by his PCP 

## 2017-09-01 NOTE — Patient Instructions (Signed)

## 2017-09-01 NOTE — Assessment & Plan Note (Signed)
History of paroxysmal atrial fibrillation status post atrial fibrillation ablation by Dr. [name] 06/19/2012.  Does have a loop recorder implanted which has shown no recurrent A. fib.  He remains on Xarelto oral anticoagulation.

## 2017-09-01 NOTE — Progress Notes (Signed)
09/01/2017 Luis [name]   [birth year]  [id number]  Primary Physician Levin Erp, MD Primary Cardiologist: Lorretta Harp MD FACP, [place], [place], [place]  HPI:  Luis [name] is a 72 y.o.  thin and fit-appearing, married Caucasian male, father of 16, grandfather to 2 grandchildren who I last saw him in the office  08/19/2016. He has a history of paroxysmal atrial fibrillation maintaining sinus rhythm when I saw him in December. He has been evaluated by [name] at [facility] remotely back in May 2008 and was cath'd by Dr. [name] the month before revealing noncritical CAD with normal LV function. He does have a 35-pack-year history of tobacco abuse having quit August 1996. His other problems include history of hypertension, mild hyperlipidemia and family history of heart disease with a father who had an MI at age 56 and a brother who had stent placed at age 58. He did get occasional palpitations when I saw him back in December. He has had 2 episodes of symptomatic A-fib, the last of which occurred on June 19, 2012, which resulted in presyncope. He noticed this while he was playing golf. When he presented to the ER his heart rate was 144. He was placed on IV diltiazem and ultimately converted to sinus rhythm and was discharged home on flecainide 50 mg p.o. b.i.d. Because his CHADS2 score was 1 it was elected to keep him on aspirin.he underwent atrial fibrillation ablation by [name] on 09/07/12 with an excellent clinical result. He's had no recurrent episodes appear PAF. His Flecainide was discontinued. He is on Xarelto oral anticoagulation. He denies chest pain or shortness of breath. He had an abdominal duplex that showed no evidence of aneurysm.  He had an implantable loop recorder placed by Dr. [name] in October 2016.[name] He's had one episode of breakthrough PAF since his ablation 3 years ago. Since I saw him a year ago his A. fib burden is 0. He's had no breakthrough episodes remains on  Xarelto oral anticoagulation.  He denies chest pain or shortness of breath.     Current Meds  Medication Sig  . diltiazem (CARDIZEM) 30 MG tablet Take 1 tablet (30 mg total) by mouth as needed.  . metoprolol tartrate (LOPRESSOR) 50 MG tablet TAKE ONE TABLET BY MOUTH TWICE DAILY.  [name] omeprazole (PRILOSEC) 20 MG capsule Take 20 mg by mouth daily.  . rosuvastatin (CRESTOR) 10 MG tablet TAKE ONE TABLET BY MOUTH ONCE DAILY  . XARELTO 20 MG TABS tablet TAKE 1 TABLET BY MOUTH DAILY WITH SUPPER     No Known Allergies  Social History   Socioeconomic History  . Marital status: Married    Spouse name: Not on file  . Number of children: Not on file  . Years of education: Not on file  . Highest education level: Not on file  Occupational History  . Not on file  Social Needs  . Financial resource strain: Not on file  . Food insecurity:    Worry: Not on file    Inability: Not on file  . Transportation needs:    Medical: Not on file    Non-medical: Not on file  Tobacco Use  . Smoking status: Former Smoker    Years: 35.00    Types: Cigarettes    Last attempt to quit: 11/27/1994    Years since quitting: 22.7  . Smokeless tobacco: Never Used  Substance and Sexual Activity  . Alcohol use: Yes    Alcohol/week:  3.5 oz    Types: 7 drink(s) per week    Comment: 3-4 oz daily  . Drug use: No  . Sexual activity: Not on file  Lifestyle  . Physical activity:    Days per week: Not on file    Minutes per session: Not on file  . Stress: Not on file  Relationships  . Social connections:    Talks on phone: Not on file    Gets together: Not on file    Attends religious service: Not on file    Active member of club or organization: Not on file    Attends meetings of clubs or organizations: Not on file    Relationship status: Not on file  . Intimate partner violence:    Fear of current or ex partner: Not on file    Emotionally abused: Not on file    Physically abused: Not on file    Forced  sexual activity: Not on file  Other Topics Concern  . Not on file  Social History Narrative   Lives in [place] with spouse.   Retired Electrical engineer     Review of Systems: General: negative for chills, fever, night sweats or weight changes.  Cardiovascular: negative for chest pain, dyspnea on exertion, edema, orthopnea, palpitations, paroxysmal nocturnal dyspnea or shortness of breath Dermatological: negative for rash Respiratory: negative for cough or wheezing Urologic: negative for hematuria Abdominal: negative for nausea, vomiting, diarrhea, bright red blood per rectum, melena, or hematemesis Neurologic: negative for visual changes, syncope, or dizziness All other systems reviewed and are otherwise negative except as noted above.    Blood pressure (!) 154/79, pulse 66, height 5\' 11"  (1.803 m), weight 175 lb 6.4 oz (79.6 kg), SpO2 98 %.  General appearance: alert and no distress Neck: no adenopathy, no carotid bruit, no JVD, supple, symmetrical, trachea midline and thyroid not enlarged, symmetric, no tenderness/mass/nodules Lungs: clear to auscultation bilaterally Heart: regular rate and rhythm, S1, S2 normal, no murmur, click, rub or gallop Extremities: extremities normal, atraumatic, no cyanosis or edema Pulses: 2+ and symmetric Skin: Skin color, texture, turgor normal. No rashes or lesions Neurologic: Alert and oriented X 3, normal strength and tone. Normal symmetric reflexes. Normal coordination and gait  EKG Not  performed today  ASSESSMENT AND PLAN:   HYPERLIPIDEMIA History of hyperlipidemia on statin therapy followed by his PCP  Essential hypertension History of essential hypertension with blood pressure measured today at 154/79.  He is on metoprolol and Cardizem.  Continue current meds at current dosing.  Paroxysmal atrial fibrillation- RFA June 2014 History of paroxysmal atrial fibrillation status post atrial fibrillation ablation by Dr. [name] 06/19/2012.   Does have a loop recorder implanted which has shown no recurrent A. fib.  He remains on Xarelto oral anticoagulation.      Lorretta Harp MD FACP,FACC,FAHA, [facility] 09/01/2017 9:42 AM

## 2017-09-01 NOTE — Assessment & Plan Note (Signed)
History of essential hypertension with blood pressure measured today at 154/79.  He is on metoprolol and Cardizem.  Continue current meds at current dosing.

## 2017-09-09 ENCOUNTER — Other Ambulatory Visit: Payer: Self-pay | Admitting: Cardiovascular Disease

## 2017-09-09 NOTE — Telephone Encounter (Signed)
Rx sent to pharmacy   

## 2017-09-11 LAB — CUP PACEART REMOTE DEVICE CHECK
Date Time Interrogation Session: 20190516013551
Implantable Pulse Generator Implant Date: 20161018

## 2017-09-21 ENCOUNTER — Ambulatory Visit (INDEPENDENT_AMBULATORY_CARE_PROVIDER_SITE_OTHER): Payer: PPO | Admitting: *Deleted

## 2017-09-21 DIAGNOSIS — R319 Hematuria, unspecified: Secondary | ICD-10-CM | POA: Diagnosis not present

## 2017-09-21 DIAGNOSIS — I480 Paroxysmal atrial fibrillation: Secondary | ICD-10-CM

## 2017-09-21 DIAGNOSIS — I4891 Unspecified atrial fibrillation: Secondary | ICD-10-CM | POA: Diagnosis not present

## 2017-09-21 DIAGNOSIS — I10 Essential (primary) hypertension: Secondary | ICD-10-CM | POA: Diagnosis not present

## 2017-09-21 DIAGNOSIS — J309 Allergic rhinitis, unspecified: Secondary | ICD-10-CM | POA: Diagnosis not present

## 2017-09-22 NOTE — Progress Notes (Signed)
Carelink Summary Report / Loop Recorder 

## 2017-09-28 DIAGNOSIS — M25562 Pain in left knee: Secondary | ICD-10-CM | POA: Diagnosis not present

## 2017-09-28 DIAGNOSIS — M25561 Pain in right knee: Secondary | ICD-10-CM | POA: Diagnosis not present

## 2017-10-23 LAB — CUP PACEART REMOTE DEVICE CHECK
Date Time Interrogation Session: 20190618013526
Implantable Pulse Generator Implant Date: 20161018

## 2017-10-26 ENCOUNTER — Ambulatory Visit (INDEPENDENT_AMBULATORY_CARE_PROVIDER_SITE_OTHER): Payer: PPO | Admitting: *Deleted

## 2017-10-26 DIAGNOSIS — I480 Paroxysmal atrial fibrillation: Secondary | ICD-10-CM

## 2017-10-26 NOTE — Progress Notes (Signed)
Carelink Summary Report / Loop Recorder 

## 2017-11-02 ENCOUNTER — Other Ambulatory Visit: Payer: Self-pay | Admitting: Cardiovascular Disease

## 2017-11-24 NOTE — Telephone Encounter (Signed)
Pt advise samples are available for pick up at the front desk.  Name: Luis [name]: 4 bottles Lot # [id number] Expires: 4/21

## 2017-11-26 ENCOUNTER — Ambulatory Visit (INDEPENDENT_AMBULATORY_CARE_PROVIDER_SITE_OTHER): Payer: PPO | Admitting: *Deleted

## 2017-11-26 DIAGNOSIS — I480 Paroxysmal atrial fibrillation: Secondary | ICD-10-CM

## 2017-11-27 NOTE — Progress Notes (Signed)
Carelink Summary Report / Loop Recorder 

## 2017-12-10 LAB — CUP PACEART REMOTE DEVICE CHECK
Date Time Interrogation Session: 20190721013953
Implantable Pulse Generator Implant Date: 20161018

## 2017-12-15 DIAGNOSIS — Z23 Encounter for immunization: Secondary | ICD-10-CM | POA: Diagnosis not present

## 2017-12-15 DIAGNOSIS — I4892 Unspecified atrial flutter: Secondary | ICD-10-CM | POA: Diagnosis not present

## 2017-12-15 DIAGNOSIS — Z1211 Encounter for screening for malignant neoplasm of colon: Secondary | ICD-10-CM | POA: Diagnosis not present

## 2017-12-15 DIAGNOSIS — I10 Essential (primary) hypertension: Secondary | ICD-10-CM | POA: Diagnosis not present

## 2017-12-15 DIAGNOSIS — Z6841 Body Mass Index (BMI) 40.0 and over, adult: Secondary | ICD-10-CM | POA: Diagnosis not present

## 2017-12-15 DIAGNOSIS — Z125 Encounter for screening for malignant neoplasm of prostate: Secondary | ICD-10-CM | POA: Diagnosis not present

## 2017-12-15 DIAGNOSIS — K635 Polyp of colon: Secondary | ICD-10-CM | POA: Diagnosis not present

## 2017-12-29 ENCOUNTER — Ambulatory Visit (INDEPENDENT_AMBULATORY_CARE_PROVIDER_SITE_OTHER): Payer: PPO | Admitting: *Deleted

## 2017-12-29 DIAGNOSIS — I480 Paroxysmal atrial fibrillation: Secondary | ICD-10-CM | POA: Diagnosis not present

## 2017-12-29 LAB — CUP PACEART REMOTE DEVICE CHECK
Date Time Interrogation Session: 20190823020740
MDC IDC PG IMPLANT DT: 20161018

## 2017-12-30 NOTE — Progress Notes (Signed)
Carelink Summary Report / Loop Recorder 

## 2018-01-04 LAB — CUP PACEART REMOTE DEVICE CHECK
Date Time Interrogation Session: 20190925074122
Implantable Pulse Generator Implant Date: 20161018

## 2018-02-01 ENCOUNTER — Ambulatory Visit (INDEPENDENT_AMBULATORY_CARE_PROVIDER_SITE_OTHER): Payer: PPO | Admitting: *Deleted

## 2018-02-01 DIAGNOSIS — I10 Essential (primary) hypertension: Secondary | ICD-10-CM

## 2018-02-01 DIAGNOSIS — I480 Paroxysmal atrial fibrillation: Secondary | ICD-10-CM

## 2018-02-02 NOTE — Progress Notes (Signed)
Carelink Summary Report / Loop Recorder 

## 2018-02-24 LAB — CUP PACEART REMOTE DEVICE CHECK
MDC IDC PG IMPLANT DT: 20161018
MDC IDC SESS DTM: 20191028104102

## 2018-03-08 ENCOUNTER — Ambulatory Visit (INDEPENDENT_AMBULATORY_CARE_PROVIDER_SITE_OTHER): Payer: PPO

## 2018-03-08 DIAGNOSIS — I480 Paroxysmal atrial fibrillation: Secondary | ICD-10-CM | POA: Diagnosis not present

## 2018-03-08 NOTE — Progress Notes (Signed)
Carelink Summary Report / Loop Recorder 

## 2018-04-07 LAB — CUP PACEART REMOTE DEVICE CHECK
Date Time Interrogation Session: 20191130103705
MDC IDC PG IMPLANT DT: 20161018

## 2018-04-08 ENCOUNTER — Ambulatory Visit (INDEPENDENT_AMBULATORY_CARE_PROVIDER_SITE_OTHER): Payer: PPO

## 2018-04-08 DIAGNOSIS — I480 Paroxysmal atrial fibrillation: Secondary | ICD-10-CM

## 2018-04-08 LAB — CUP PACEART REMOTE DEVICE CHECK
Date Time Interrogation Session: 20200102110536
MDC IDC PG IMPLANT DT: 20161018

## 2018-04-09 NOTE — Progress Notes (Signed)
Carelink Summary Report / Loop Recorder 

## 2018-04-11 ENCOUNTER — Other Ambulatory Visit: Payer: Self-pay | Admitting: Cardiovascular Disease

## 2018-04-23 ENCOUNTER — Encounter: Payer: Self-pay | Admitting: Internal Medicine

## 2018-04-23 ENCOUNTER — Ambulatory Visit (INDEPENDENT_AMBULATORY_CARE_PROVIDER_SITE_OTHER): Payer: PPO | Admitting: Internal Medicine

## 2018-04-23 VITALS — BP 132/80 | HR 63 | Ht 71.0 in | Wt 177.2 lb

## 2018-04-23 DIAGNOSIS — I480 Paroxysmal atrial fibrillation: Secondary | ICD-10-CM | POA: Diagnosis not present

## 2018-04-23 LAB — CUP PACEART INCLINIC DEVICE CHECK
Date Time Interrogation Session: 20200117142148
Implantable Pulse Generator Implant Date: 20161018

## 2018-04-23 MED ORDER — DILTIAZEM HCL 30 MG PO TABS: 30.0000 mg | ORAL_TABLET | ORAL | 3 refills | Status: DC | PRN

## 2018-04-23 NOTE — Patient Instructions (Addendum)
Medication Instructions:  Your physician recommends that you continue on your current medications as directed. Please refer to the Current Medication list given to you today.  Labwork: None ordered.  Testing/Procedures: None ordered.  Follow-Up: Your physician wants you to follow-up in: one year with Dr. [name].   You will receive a reminder letter in the mail two months in advance. If you don't receive a letter, please call our office to schedule the follow-up appointment.  Remote monthly monitoring.  Any Other Special Instructions Will Be Listed Below (If Applicable).  If you need a refill on your cardiac medications before your next appointment, please call your pharmacy.

## 2018-04-23 NOTE — Progress Notes (Signed)
Electrophysiology Office Note Date: 04/23/2018  ID:  Drakkar, Medeiros 13-Apr-1945, MRN [id number]  PCP: Levin Erp, MD Primary Cardiologist: Dr Gwenlyn Found Electrophysiologist: Dr [name]  CC: Follow up for atrial fibrillation  Luis [name] is a 73 y.o. male seen today for routine electrophysiology followup.  Since last being seen in our clinic, the patient reports doing very well from a cardiac standpoint. He had one episode of palpitations about 1 month ago. ILR interrogation showed this was frequent PACs. No afib recorded on device. Of note, he does report blood in his urine which he has seen urology. This has been stable chronically and his most recent CBC with his PCP was good.  He denies chest pain, palpitations, dyspnea, PND, orthopnea, nausea, vomiting, dizziness, syncope, edema, weight gain, or early satiety.  Past Medical History:  Diagnosis Date  . Allergy    SEASONAL  . Arthritis    BACK AND KNEES  . GERD (gastroesophageal reflux disease)   . History of kidney stones 07/2006   x 1  . Hyperlipidemia   . Hypertension   . Normal coronary arteries 2008   Normal LV function  . Paroxysmal atrial fibrillation ([place])    2008 and 20014, PVI 09/07/12   Past Surgical History:  Procedure Laterality Date  . ATRIAL ABLATION SURGERY  09/07/12   PVI by Dr [name]  . ATRIAL FIBRILLATION ABLATION N/A 09/07/2012   Procedure: ATRIAL FIBRILLATION ABLATION;  Surgeon: Thompson Grayer, MD;  Location: [facility] C[facility] LAB;  Service: Cardiovascular;  Laterality: N/A;  . CARDIAC CATHETERIZATION Left 07/20/2006   No significant CAD, EF 60%, continue medical therapy  . COLONOSCOPY    . EP IMPLANTABLE DEVICE N/A 01/23/2015   Procedure: Loop Recorder Insertion;  Surgeon: Thompson Grayer, MD;  Location: [place] CV LAB;  Service: Cardiovascular;  Laterality: N/A;  . KNEE ARTHROSCOPY    . TEE WITHOUT CARDIOVERSION N/A 09/06/2012   Procedure: TRANSESOPHAGEAL ECHOCARDIOGRAM (TEE);  Surgeon: Pixie Casino, MD;   Location[facility] ENDOSCOPY;  Service: Cardiovascular;  Laterality: N/A;  . UPPER GASTROINTESTINAL ENDOSCOPY      Current Outpatient Medications  Medication Sig Dispense Refill  . diltiazem (CARDIZEM) 30 MG tablet Take 1 tablet (30 mg total) by mouth as needed. 15 tablet 3  . metoprolol tartrate (LOPRESSOR) 50 MG tablet TAKE 1 TABLET BY MOUTH TWICE DAILY 180 tablet 2  . omeprazole (PRILOSEC) 20 MG capsule Take 20 mg by mouth daily.    . rosuvastatin (CRESTOR) 10 MG tablet TAKE 1 TABLET BY MOUTH ONCE DAILY 90 tablet 3  . XARELTO 20 MG TABS tablet TAKE 1 TABLET BY MOUTH ONCE DAILY WITH  SUPPER 90 tablet 0   No current facility-administered medications for this visit.     Allergies:   Patient has no known allergies.   Social History: Social History   Socioeconomic History  . Marital status: Married    Spouse name: Not on file  . Number of children: Not on file  . Years of education: Not on file  . Highest education level: Not on file  Occupational History  . Not on file  Social Needs  . Financial resource strain: Not on file  . Food insecurity:    Worry: Not on file    Inability: Not on file  . Transportation needs:    Medical: Not on file    Non-medical: Not on file  Tobacco Use  . Smoking status: Former Smoker    Years: 35.00  Types: Cigarettes    Last attempt to quit: 11/27/1994    Years since quitting: 23.4  . Smokeless tobacco: Never Used  Substance and Sexual Activity  . Alcohol use: Yes    Alcohol/week: 7.0 standard drinks    Types: 7 drink(s) per week    Comment: 3-4 oz daily  . Drug use: No  . Sexual activity: Not on file  Lifestyle  . Physical activity:    Days per week: Not on file    Minutes per session: Not on file  . Stress: Not on file  Relationships  . Social connections:    Talks on phone: Not on file    Gets together: Not on file    Attends religious service: Not on file    Active member of club or organization: Not on file    Attends meetings  of clubs or organizations: Not on file    Relationship status: Not on file  . Intimate partner violence:    Fear of current or ex partner: Not on file    Emotionally abused: Not on file    Physically abused: Not on file    Forced sexual activity: Not on file  Other Topics Concern  . Not on file  Social History Narrative   Lives in [place] with spouse.   Retired Electrical engineer    Family History: Family History  Problem Relation Age of Onset  . Pancreatic cancer Mother   . CAD Father   . Heart Problems Father   . Stroke Father   . Hypertension Father   . Heart Problems Brother 8       Had stent put in    Review of Systems: All other systems reviewed and are otherwise negative except as noted above.   Physical Exam: VS:  There were no vitals taken for this visit. , BMI There is no height or weight on file to calculate BMI. Wt Readings from Last 3 Encounters:  09/01/17 175 lb 6.4 oz (79.6 kg)  07/06/17 178 lb (80.7 kg)  08/19/16 176 lb 9.6 oz (80.1 kg)    GEN- The patient is well appearing, alert and oriented x 3 today.   HEENT: normocephalic, atraumatic; sclera clear, conjunctiva pink; hearing intact; oropharynx clear; neck supple, no JVP Lymph- no cervical lymphadenopathy Lungs- Clear to ausculation bilaterally, normal work of breathing.  No wheezes, rales, rhonchi, diminished breath sounds. Heart- Regular rate and rhythm, no murmurs, rubs or gallops, PMI not laterally displaced GI- soft, non-tender, non-distended, bowel sounds present, no hepatosplenomegaly Extremities- no clubbing, cyanosis, or edema; DP/PT/radial pulses 2+ bilaterally MS- no significant deformity or atrophy Skin- warm and dry, no rash or lesion  Psych- euthymic mood, full affect Neuro- strength and sensation are intact   EKG:  EKG is ordered today. The ekg ordered today shows SR HR 63, PR 164, QRS 94, QTc 419   Other studies Reviewed: Additional studies/ records that were reviewed  today include: Epic notes  Assessment and Plan:  1. Paroxysmal atrial fibrillation S/p afib ablation 09/2012. Well controlled by ILR interrogation, AF burden 0% Continue diltiazem 30 mg q4-6 hrs PRN Continue Lopressor 50 mg BID Continue Xarelto 20 mg daily  This patients CHA2DS2-VASc Score and unadjusted Ischemic Stroke Rate (% per year) is equal to 2.2 % stroke rate/year from a score of 2  Above score calculated as 1 point each if present [CHF, HTN, DM, Vascular=MI/PAD/Aortic Plaque, Age if 65-74, or Male] Above score calculated as 2 points each if present [  Age > 67, or Stroke/TIA/TE]  We discussed stopping anticoagulation given no afib x several years.  Given family history of stroke, he is very clear that he wishes to continue xarelto at this time.  2. HTN Stable, no changes today   Current medicines are reviewed at length with the patient today.   The patient does not have concerns regarding his medicines.  The following changes were made today:  none  Labs/ tests ordered today include:  Orders Placed This Encounter  Procedures  . EKG 12-Lead   carelink Return in a year  Army Fossa MD 04/23/2018 8:19 AM   [facility] [address] [place] [address] [phone] (office) [phone] (fax)

## 2018-05-11 ENCOUNTER — Ambulatory Visit (INDEPENDENT_AMBULATORY_CARE_PROVIDER_SITE_OTHER): Payer: PPO

## 2018-05-11 DIAGNOSIS — I480 Paroxysmal atrial fibrillation: Secondary | ICD-10-CM | POA: Diagnosis not present

## 2018-05-14 LAB — CUP PACEART REMOTE DEVICE CHECK
Date Time Interrogation Session: 20200204110716
Implantable Pulse Generator Implant Date: 20161018

## 2018-05-18 DIAGNOSIS — C44622 Squamous cell carcinoma of skin of right upper limb, including shoulder: Secondary | ICD-10-CM | POA: Diagnosis not present

## 2018-05-18 DIAGNOSIS — D1801 Hemangioma of skin and subcutaneous tissue: Secondary | ICD-10-CM | POA: Diagnosis not present

## 2018-05-18 DIAGNOSIS — C4441 Basal cell carcinoma of skin of scalp and neck: Secondary | ICD-10-CM | POA: Diagnosis not present

## 2018-05-18 DIAGNOSIS — D485 Neoplasm of uncertain behavior of skin: Secondary | ICD-10-CM | POA: Diagnosis not present

## 2018-05-18 DIAGNOSIS — L570 Actinic keratosis: Secondary | ICD-10-CM | POA: Diagnosis not present

## 2018-05-18 DIAGNOSIS — L821 Other seborrheic keratosis: Secondary | ICD-10-CM | POA: Diagnosis not present

## 2018-05-20 NOTE — Progress Notes (Signed)
Carelink Summary Report / Loop Recorder 

## 2018-06-06 ENCOUNTER — Other Ambulatory Visit: Payer: Self-pay | Admitting: Cardiovascular Disease

## 2018-06-07 DIAGNOSIS — M25561 Pain in right knee: Secondary | ICD-10-CM | POA: Diagnosis not present

## 2018-06-07 DIAGNOSIS — M1711 Unilateral primary osteoarthritis, right knee: Secondary | ICD-10-CM | POA: Diagnosis not present

## 2018-06-07 DIAGNOSIS — M25562 Pain in left knee: Secondary | ICD-10-CM | POA: Diagnosis not present

## 2018-06-07 DIAGNOSIS — M170 Bilateral primary osteoarthritis of knee: Secondary | ICD-10-CM | POA: Diagnosis not present

## 2018-06-07 DIAGNOSIS — M1712 Unilateral primary osteoarthritis, left knee: Secondary | ICD-10-CM | POA: Diagnosis not present

## 2018-06-08 ENCOUNTER — Telehealth: Payer: Self-pay | Admitting: Nurse Practitioner

## 2018-06-08 NOTE — Telephone Encounter (Signed)
LINQ at [facility]. Discussed with patient. He is leaning towards re-implant of device. Appt made with Dr [name] to discuss (may prefer to wait until LINQII comes out). Carelink return kit ordered  [employer], NP 06/08/2018 10:40 AM

## 2018-06-14 ENCOUNTER — Encounter: Payer: PPO | Admitting: *Deleted

## 2018-06-25 ENCOUNTER — Encounter: Payer: Self-pay | Admitting: Internal Medicine

## 2018-06-30 ENCOUNTER — Telehealth: Payer: Self-pay

## 2018-06-30 NOTE — Telephone Encounter (Signed)
YOUR CARDIOLOGY TEAM HAS ARRANGED FOR AN E-VISIT FOR YOUR APPOINTMENT - PLEASE REVIEW IMPORTANT INFORMATION BELOW SEVERAL DAYS PRIOR TO YOUR APPOINTMENT  Due to the recent COVID-19 pandemic, we are transitioning in-person office visits to tele-medicine visits in an effort to decrease unnecessary exposure to our patients and staff. Medicare and most insurances are covering these visits without a copay needed. You will need a working email and a smartphone or computer with a camera and microphone. For patients that do not have these items, we can still complete the visit using a telephone but do prefer video when possible. If possible, we also ask that you have a blood pressure cuff and scale at home to measure your blood pressure, heart rate and weight prior to your scheduled appointment. Patients with clinical needs that need an in-person evaluation and testing will still be able to come to the office if absolutely necessary. If you have any questions, feel free to call our office.     DOWNLOADING THE SOFTWARE  Download the [employer] WebEx app to enable video and telephone visits with your [facility] Provider.   Instructions for downloading [employer] WebEx: - Go to https://www.webex.com/downloads.html and follow the instructions, or download the app on your smartphone ([employer] Webex Meetings). - If you have technical difficulties with downloading WebEx, please call WebEx at [phone]. - Once the app is downloaded (can be done on either mobile or desktop computer), go to Settings in the upper left hand corner.  Be sure that camera and audio are enabled.  - You will receive an email message with a link to the meeting with a time to join for your tele-health visit.  - Please download the app and have settings configured prior to the appointment time.      2-3 DAYS BEFORE YOUR APPOINTMENT  One of our staff will call you to confirm that you have been able to set up your WebEx account. We will remind  you check your blood pressure, heart rate and weight prior to your scheduled appointment. If you have an Apple Watch or Kardia, please upload any pertinent ECG strips the day before or morning of your appointment to MyChart. Our staff will also make sure you have reviewed the consent and agree to move forward with your scheduled tele-health visit.    THE DAY OF YOUR APPOINTMENT  Approximately 15-20 minutes prior to your scheduled appointment, you will receive an e-mail directly from one of our staff member's [email or web address] e-mail accounts inviting you to join a WebEx meeting.  Please do not reply to that email - simply join the WebEx meeting.  Upon joining, a member of the office staff will speak with you initially through the WebEx platform to confirm medications, vital signs for the day and any symptoms you may be experiencing.  Please have this information available prior to the time of visit start.      CONSENT FOR TELE-HEALTH VISIT - PLEASE RVIEW  I hereby voluntarily request, consent and authorize [facility] and its employed or contracted physicians, physician assistants, nurse practitioners or other licensed health care professionals (the Practitioner), to provide me with telemedicine health care services (the "Services") as deemed necessary by the treating Practitioner. I acknowledge and consent to receive the Services by the Practitioner via telemedicine. I understand that the telemedicine visit will involve communicating with the Practitioner through live audiovisual communication technology and the disclosure of certain medical information by electronic transmission. I acknowledge that I have been given the opportunity to   request an in-person assessment or other available alternative prior to the telemedicine visit and am voluntarily participating in the telemedicine visit.  I understand that I have the right to withhold or withdraw my consent to the use of telemedicine in the course of  my care at any time, without affecting my right to future care or treatment, and that the Practitioner or I may terminate the telemedicine visit at any time. I understand that I have the right to inspect all information obtained and/or recorded in the course of the telemedicine visit and may receive copies of available information for a reasonable fee.  I understand that some of the potential risks of receiving the Services via telemedicine include:  . Delay or interruption in medical evaluation due to technological equipment failure or disruption; . Information transmitted may not be sufficient (e.g. poor resolution of images) to allow for appropriate medical decision making by the Practitioner; and/or  . In rare instances, security protocols could fail, causing a breach of personal health information.  Furthermore, I acknowledge that it is my responsibility to provide information about my medical history, conditions and care that is complete and accurate to the best of my ability. I acknowledge that Practitioner's advice, recommendations, and/or decision may be based on factors not within their control, such as incomplete or inaccurate data provided by me or distortions of diagnostic images or specimens that may result from electronic transmissions. I understand that the practice of medicine is not an exact science and that Practitioner makes no warranties or guarantees regarding treatment outcomes. I acknowledge that I will receive a copy of this consent concurrently upon execution via email to the email address I last provided but may also request a printed copy by calling the office of [facility].    I understand that my insurance will be billed for this visit.   I have read or had this consent read to me. . I understand the contents of this consent, which adequately explains the benefits and risks of the Services being provided via telemedicine.  . I have been provided ample opportunity to ask  questions regarding this consent and the Services and have had my questions answered to my satisfaction. . I give my informed consent for the services to be provided through the use of telemedicine in my medical care  By participating in this telemedicine visit I agree to the above.  

## 2018-07-03 ENCOUNTER — Telehealth: Payer: Self-pay

## 2018-07-03 NOTE — Telephone Encounter (Signed)
Poke with pt to setup virtual visit. Pt questions and concerns was address. Virtual visit setup completed.

## 2018-07-05 ENCOUNTER — Telehealth (INDEPENDENT_AMBULATORY_CARE_PROVIDER_SITE_OTHER): Payer: PPO | Admitting: Internal Medicine

## 2018-07-05 DIAGNOSIS — Z7901 Long term (current) use of anticoagulants: Secondary | ICD-10-CM

## 2018-07-05 DIAGNOSIS — I10 Essential (primary) hypertension: Secondary | ICD-10-CM | POA: Diagnosis not present

## 2018-07-05 DIAGNOSIS — I480 Paroxysmal atrial fibrillation: Secondary | ICD-10-CM

## 2018-07-05 NOTE — Progress Notes (Signed)
Electrophysiology TeleHealth Note   Due to national recommendations of social distancing due to COVID 19, an audio/video telehealth visit is felt to be most appropriate for this patient at this time.  See MyChart message from today for the patient's consent to telehealth for [facility].   Date:  07/05/2018   ID:  Luis [name], Luis [name] Sep 05, 1945, MRN [id number]  Location: patient's home  Provider location: [address] [place]  Evaluation Performed: Follow-up visit  PCP:  Levin Erp, MD  Electrophysiologist:  Dr [name]  Chief Complaint:  afib  History of Present Illness:    Luis [name] is a 73 y.o. male who presents via audio/video conferencing for a telehealth visit today.  Since last being seen in our clinic, the patient reports doing very well.  Today, he denies symptoms of palpitations, chest pain, shortness of breath,  lower extremity edema, dizziness, presyncope, or syncope.  The patient is otherwise without complaint today.  The patient denies symptoms of fevers, chills, cough, or new SOB worrisome for COVID 19.  Past Medical History:  Diagnosis Date   Allergy    SEASONAL   Arthritis    BACK AND KNEES   GERD (gastroesophageal reflux disease)    History of kidney stones 07/2006   x 1   Hyperlipidemia    Hypertension    Normal coronary arteries 2008   Normal LV function   Paroxysmal atrial fibrillation ([place])    2008 and 20014, PVI 09/07/12    Past Surgical History:  Procedure Laterality Date   ATRIAL ABLATION SURGERY  09/07/12   PVI by Dr [name]   ATRIAL FIBRILLATION ABLATION N/A 09/07/2012   Procedure: ATRIAL FIBRILLATION ABLATION;  Surgeon: Thompson Grayer, MD;  Location: [facility] CATH LAB;  Service: Cardiovascular;  Laterality: N/A;   CARDIAC CATHETERIZATION Left 07/20/2006   No significant CAD, EF 60%, continue medical therapy   COLONOSCOPY     EP IMPLANTABLE DEVICE N/A 01/23/2015   Procedure: Loop Recorder Insertion;  Surgeon: Thompson Grayer, MD;  Location: [place] CV LAB;  Service: Cardiovascular;  Laterality: N/A;   KNEE ARTHROSCOPY     TEE WITHOUT CARDIOVERSION N/A 09/06/2012   Procedure: TRANSESOPHAGEAL ECHOCARDIOGRAM (TEE);  Surgeon: Pixie Casino, MD;  Location[facility] ENDOSCOPY;  Service: Cardiovascular;  Laterality: N/A;   UPPER GASTROINTESTINAL ENDOSCOPY      Current Outpatient Medications  Medication Sig Dispense Refill   diltiazem (CARDIZEM) 30 MG tablet Take 1 tablet (30 mg total) by mouth as needed. 15 tablet 3   metoprolol tartrate (LOPRESSOR) 50 MG tablet Take 1 tablet by mouth twice daily 180 tablet 3   omeprazole (PRILOSEC) 20 MG capsule Take 20 mg by mouth daily.     rosuvastatin (CRESTOR) 10 MG tablet TAKE 1 TABLET BY MOUTH ONCE DAILY 90 tablet 3   XARELTO 20 MG TABS tablet TAKE 1 TABLET BY MOUTH ONCE DAILY WITH  SUPPER 90 tablet 0   No current facility-administered medications for this visit.     Allergies:   Patient has no known allergies.   Social History:  The patient  reports that he quit smoking about 23 years ago. His smoking use included cigarettes. He quit after 35.00 years of use. He has never used smokeless tobacco. He reports current alcohol use of about 7.0 standard drinks of alcohol per week. He reports that he does not use drugs.   Family History:  The patient's  family history includes CAD in his father; Heart Problems  in his father; Heart Problems (age of onset: 27) in his brother; Hypertension in his father; Pancreatic cancer in his mother; Stroke in his father.   ROS:  Please see the history of present illness.   All other systems are personally reviewed and negative.    Exam:    Vital Signs: Wt 170 lbs today, HR 60 today,  Did not check bp  Well appearing, alert and conversant, regular work of breathing,  good skin color Eyes- anicteric, neuro- grossly intact, skin- no apparent rash or lesions or cyanosis, mouth- oral mucosa is pink   Labs/Other Tests and Data  Reviewed:    Recent Labs: No results found for requested labs within last 8760 hours.   Wt Readings from Last 3 Encounters:  04/23/18 177 lb 3.2 oz (80.4 kg)  09/01/17 175 lb 6.4 oz (79.6 kg)  07/06/17 178 lb (80.7 kg)     Other studies personally reviewed: Additional studies/ records that were reviewed today include: my prior office visits, recent ILR tracing  Review of the above records today demonstrates: as above    Last ILR remote   ASSESSMENT & PLAN:    1.  Paroxysmal atrial fibrillation S/p ablation 09/2012 Doing well, without further afib His ILR is at [facility].  We discussed at length today.  I discussed Apple Watch as an alternative today. He would like to have a new ILR placed once COVID 19 is over.  He is hoping that LINQ II is available then. chads2vasc score is 2.  He is on xarelto and having no bleeding issues.  We will call him for ILR removal with new device placement after COVID 19 has passed.  2. HTN Stable No change required today  3. COVID 19 screen The patient denies symptoms of COVID 19 at this time.  The importance of social distancing was discussed today.  Follow-up:  6 months  Current medicines are reviewed at length with the patient today.   The patient does not have concerns regarding his medicines.  The following changes were made today:  none  Labs/ tests ordered today include:  No orders of the defined types were placed in this encounter.    Patient Risk:  after full review of this patients clinical status, I feel that they are at moderate risk at this time.  Today, I have spent 22 minutes with the patient with telehealth technology discussing afib monitoring options .    Army Fossa, MD  07/05/2018 9:27 AM     [place] [address] [place] [address] [phone] (office) [phone] (fax)

## 2018-07-12 ENCOUNTER — Other Ambulatory Visit: Payer: Self-pay | Admitting: Cardiovascular Disease

## 2018-07-12 NOTE — Telephone Encounter (Signed)
Pt last saw Dr [name] 07/05/18, last labs 12/15/17 Creat 1.19, age 73, weight 80.4kg, CrCl 61.93, based on CrCl pt is on appropriate dosage of Xarelto 20mg  QD.  Will refill rx.

## 2018-08-03 DIAGNOSIS — B354 Tinea corporis: Secondary | ICD-10-CM | POA: Diagnosis not present

## 2018-08-03 DIAGNOSIS — Z85828 Personal history of other malignant neoplasm of skin: Secondary | ICD-10-CM | POA: Diagnosis not present

## 2018-08-31 ENCOUNTER — Telehealth: Payer: Self-pay | Admitting: Cardiovascular Disease

## 2018-08-31 NOTE — Telephone Encounter (Signed)
Patient was offered virtual/ Video or Tele visit is okay/ Nurse was informed/smartphone/ consent/ my chart/ pre reg completed

## 2018-09-01 ENCOUNTER — Telehealth (INDEPENDENT_AMBULATORY_CARE_PROVIDER_SITE_OTHER): Payer: PPO | Admitting: Cardiovascular Disease

## 2018-09-01 ENCOUNTER — Telehealth: Payer: Self-pay

## 2018-09-01 DIAGNOSIS — E782 Mixed hyperlipidemia: Secondary | ICD-10-CM

## 2018-09-01 DIAGNOSIS — I480 Paroxysmal atrial fibrillation: Secondary | ICD-10-CM | POA: Diagnosis not present

## 2018-09-01 DIAGNOSIS — I10 Essential (primary) hypertension: Secondary | ICD-10-CM | POA: Diagnosis not present

## 2018-09-01 DIAGNOSIS — Z7901 Long term (current) use of anticoagulants: Secondary | ICD-10-CM

## 2018-09-01 NOTE — Patient Instructions (Signed)

## 2018-09-01 NOTE — Progress Notes (Signed)
Virtual Visit via Video Note   This visit type was conducted due to national recommendations for restrictions regarding the COVID-19 Pandemic (e.g. social distancing) in an effort to limit this patient's exposure and mitigate transmission in our community.  Due to his co-morbid illnesses, this patient is at least at moderate risk for complications without adequate follow up.  This format is felt to be most appropriate for this patient at this time.  All issues noted in this document were discussed and addressed.  A limited physical exam was performed with this format.  Please refer to the patient's chart for his consent to telehealth for [facility].   Date:  09/01/2018   ID:  Luis [name], Luis [name] [birth year], MRN [id number]  Patient Location: Home Provider Location: Home  PCP:  Levin Erp, MD  Cardiologist: Dr. Quay Burow Electrophysiologist:  None   Evaluation Performed:  Follow-Up Visit  Chief Complaint: 1 year follow-up A. fib/hypertension  History of Present Illness:    Luis [name] is a 73y.o.  thin and fit-appearing, married Caucasian male, father of 49, grandfather to 2 grandchildren who I last saw him in the office  09/01/2017. He has a history of paroxysmal atrial fibrillation maintaining sinus rhythm when I saw him in December. He has been evaluated by [name] at [facility] remotely back in May 2008 and was cath'd by Dr. [name] the month before revealing noncritical CAD with normal LV function. He does have a 35-pack-year history of tobacco abuse having quit August 1996. His other problems include history of hypertension, mild hyperlipidemia and family history of heart disease with a father who had an MI at age 76 and a brother who had stent placed at age 47. He did get occasional palpitations when I saw him back in December. He has had 2 episodes of symptomatic A-fib, the last of which occurred on June 19, 2012, which resulted in presyncope. He noticed this while  he was playing golf. When he presented to the ER his heart rate was 144. He was placed on IV diltiazem and ultimately converted to sinus rhythm and was discharged home on flecainide 50 mg p.o. b.i.d. Because his CHADS2 score was 1 it was elected to keep him on aspirin.he underwent atrial fibrillation ablation by [name] on 09/07/12 with an excellent clinical result. He's had no recurrent episodes appear PAF. His Flecainide was discontinued. He is on Xarelto oral anticoagulation. He denies chest pain or shortness of breath. He had an abdominal duplex that showed no evidence of aneurysm.  He had an implantable loop recorder placed by Dr. [name] in October2016.[name] He's had one episode of breakthrough PAF since his ablation 3 years ago.Since I saw him a year ago his A. fib burden is 0. He's had no breakthrough episodes remains on Xarelto oral anticoagulation.    Since I saw him a year ago he is remained stable.  He walks and is active on a daily basis.  He just recently saw Dr. [name] for A. fib ablation follow-up and apparently his loop recorder battery is out of power and there are plans to replace that after COVID-19.   The patient does not have symptoms concerning for COVID-19 infection (fever, chills, cough, or new shortness of breath).    Past Medical History:  Diagnosis Date   Allergy    SEASONAL   Arthritis    BACK AND KNEES   GERD (gastroesophageal reflux disease)    History of kidney stones 07/2006  x 1   Hyperlipidemia    Hypertension    Normal coronary arteries 2008   Normal LV function   Paroxysmal atrial fibrillation ([place])    2008 and 20014, PVI 09/07/12   Past Surgical History:  Procedure Laterality Date   ATRIAL ABLATION SURGERY  09/07/12   PVI by Dr [name]   ATRIAL FIBRILLATION ABLATION N/A 09/07/2012   Procedure: ATRIAL FIBRILLATION ABLATION;  Surgeon: Thompson Grayer, MD;  Location: [facility] CATH LAB;  Service: Cardiovascular;  Laterality: N/A;   CARDIAC  CATHETERIZATION Left 07/20/2006   No significant CAD, EF 60%, continue medical therapy   COLONOSCOPY     EP IMPLANTABLE DEVICE N/A 01/23/2015   Procedure: Loop Recorder Insertion;  Surgeon: Thompson Grayer, MD;  Location: [facility] CV LAB;  Service: Cardiovascular;  Laterality: N/A;   KNEE ARTHROSCOPY     TEE WITHOUT CARDIOVERSION N/A 09/06/2012   Procedure: TRANSESOPHAGEAL ECHOCARDIOGRAM (TEE);  Surgeon: Pixie Casino, MD;  Location: [facility] ENDOSCOPY;  Service: Cardiovascular;  Laterality: N/A;   UPPER GASTROINTESTINAL ENDOSCOPY       No outpatient medications have been marked as taking for the 09/01/18 encounter (Appointment) with Lorretta Harp, MD.     Allergies:   Patient has no known allergies.   Social History   Tobacco Use   Smoking status: Former Smoker    Years: 35.00    Types: Cigarettes    Last attempt to quit: 11/27/1994    Years since quitting: 23.7   Smokeless tobacco: Never Used  Substance Use Topics   Alcohol use: Yes    Alcohol/week: 7.0 standard drinks    Types: 7 drink(s) per week    Comment: 3-4 oz daily   Drug use: No     Family Hx: The patient's family history includes CAD in his father; Heart Problems in his father; Heart Problems (age of onset: 10) in his brother; Hypertension in his father; Pancreatic cancer in his mother; Stroke in his father.  ROS:   Please see the history of present illness.     All other systems reviewed and are negative.   Prior CV studies:   The following studies were reviewed today:  None  Labs/Other Tests and Data Reviewed:    EKG:  No ECG reviewed.  Recent Labs: No results found for requested labs within last 8760 hours.   Recent Lipid Panel Lab Results  Component Value Date/Time   CHOL 205 (H) 07/24/2014 08:10 AM   TRIG 174 (H) 07/24/2014 08:10 AM   HDL 40 07/24/2014 08:10 AM   CHOLHDL 5.1 07/24/2014 08:10 AM   LDLCALC 130 (H) 07/24/2014 08:10 AM    Wt Readings from Last 3 Encounters:  04/23/18  177 lb 3.2 oz (80.4 kg)  09/01/17 175 lb 6.4 oz (79.6 kg)  07/06/17 178 lb (80.7 kg)     Objective:    Vital Signs:  There were no vitals taken for this visit.   VITAL SIGNS:  reviewed GEN:  no acute distress RESPIRATORY:  normal respiratory effort, symmetric expansion NEURO:  alert and oriented x 3, no obvious focal deficit PSYCH:  normal affect  ASSESSMENT & PLAN:    1. Paroxysmal atrial fibrillation- history of PAF status post ablation by Dr. [name] 09/07/2012.  He remains on Xarelto.  He has had no breakthrough episodes in the last 12 months.  Does have an implantable loop recorder which is out of power with plans to replace by Dr. [name] in the upcoming future. 2. Essential hypertension- history of  essential hypertension on metoprolol and diltiazem.  He was unable to check his vital signs at home today. 3. Hyperlipidemia- history of hyperlipidemia on Crestor with lipid profile performed 12/15/2017 revealing total cholesterol 138, LDL 62 and HDL 58.  COVID-19 Education: The signs and symptoms of COVID-19 were discussed with the patient and how to seek care for testing (follow up with PCP or arrange E-visit).  The importance of social distancing was discussed today.  Time:   Today, I have spent 6 minutes with the patient with telehealth technology discussing the above problems.     Medication Adjustments/Labs and Tests Ordered: Current medicines are reviewed at length with the patient today.  Concerns regarding medicines are outlined above.   Tests Ordered: No orders of the defined types were placed in this encounter.   Medication Changes: No orders of the defined types were placed in this encounter.   Disposition:  Follow up in 1 year(s)  Signed, Quay Burow, MD  09/01/2018 7:53 AM    [facility]

## 2018-09-01 NOTE — Telephone Encounter (Signed)
Patient and/or DPR-approved person aware of AVS instructions and verbalized understanding. avs to be released to [employer]

## 2018-10-26 ENCOUNTER — Other Ambulatory Visit: Payer: Self-pay | Admitting: Cardiovascular Disease

## 2018-11-15 ENCOUNTER — Telehealth: Payer: Self-pay | Admitting: Internal Medicine

## 2018-11-15 NOTE — Telephone Encounter (Signed)
New Message     Patient states he received letter for pacer check but states he has a loop recorder and the battery is dead on it so he wants to know if he should make appointment.  Please call patient back to discuss.

## 2018-11-15 NOTE — Telephone Encounter (Signed)
Pt states he received letter to make an appt w/ Dr. [name], says that at last appt, they discussed putting in a new ILR? Will forward to discuss appt options.

## 2018-11-22 DIAGNOSIS — Z85828 Personal history of other malignant neoplasm of skin: Secondary | ICD-10-CM | POA: Diagnosis not present

## 2018-11-22 DIAGNOSIS — D0472 Carcinoma in situ of skin of left lower limb, including hip: Secondary | ICD-10-CM | POA: Diagnosis not present

## 2018-11-22 DIAGNOSIS — D485 Neoplasm of uncertain behavior of skin: Secondary | ICD-10-CM | POA: Diagnosis not present

## 2018-12-01 ENCOUNTER — Encounter: Payer: Self-pay | Admitting: Internal Medicine

## 2018-12-01 ENCOUNTER — Telehealth (INDEPENDENT_AMBULATORY_CARE_PROVIDER_SITE_OTHER): Payer: PPO | Admitting: Internal Medicine

## 2018-12-01 ENCOUNTER — Telehealth: Payer: Self-pay

## 2018-12-01 ENCOUNTER — Other Ambulatory Visit: Payer: Self-pay

## 2018-12-01 VITALS — Ht 71.0 in | Wt 170.0 lb

## 2018-12-01 DIAGNOSIS — I10 Essential (primary) hypertension: Secondary | ICD-10-CM | POA: Diagnosis not present

## 2018-12-01 DIAGNOSIS — I480 Paroxysmal atrial fibrillation: Secondary | ICD-10-CM | POA: Diagnosis not present

## 2018-12-01 NOTE — Telephone Encounter (Signed)
-----   Message from Thompson Grayer, MD sent at 12/01/2018 10:02 AM EDT ----- Please call and schedule in office loop recorder placement the next day that I am in the office as DOD.

## 2018-12-01 NOTE — Telephone Encounter (Signed)
Call placed to Pt.  Pt scheduled for loop removal with reimplant on 12/24/2018 at 8:15 am.  Message sent to precert.

## 2018-12-01 NOTE — Progress Notes (Signed)
Electrophysiology TeleHealth Note  Due to national recommendations of social distancing due to Benjamin 19, an audio telehealth visit is felt to be most appropriate for this patient at this time.  Verbal consent was obtained by me for the telehealth visit today.  The patient does not have capability for a virtual visit.  A phone visit is therefore required today.   Date:  12/01/2018   ID:  Kay, Khan [birth year], MRN [id number]  Location: patient's home  Provider location:  [facility]  Evaluation Performed: Follow-up visit  PCP:  Levin Erp, MD   Electrophysiologist:  Dr [name] Cardiology:  Dr Gwenlyn Found Chief Complaint:  palpitations  History of Present Illness:    Luis [name] is a 73 y.o. male who presents via telehealth conferencing today.  Since last being seen in our clinic, the patient reports doing very well.  He reports occasional heart "pauses" which she describes as a "skip" lasting several seconds.  Today, he denies symptoms of exertional chest pain, shortness of breath,  lower extremity edema, dizziness, presyncope, or syncope. He does have some chest wall pain that he states "I dont think its anything". He walks 3-4 miles per day without any symptoms.  The patient is otherwise without complaint today.  The patient denies symptoms of fevers, chills, cough, or new SOB worrisome for COVID 19.  Past Medical History:  Diagnosis Date  . Allergy    SEASONAL  . Arthritis    BACK AND KNEES  . GERD (gastroesophageal reflux disease)   . History of kidney stones 07/2006   x 1  . Hyperlipidemia   . Hypertension   . Normal coronary arteries 2008   Normal LV function  . Paroxysmal atrial fibrillation ([place])    2008 and 20014, PVI 09/07/12    Past Surgical History:  Procedure Laterality Date  . ATRIAL ABLATION SURGERY  09/07/12   PVI by Dr [name]  . ATRIAL FIBRILLATION ABLATION N/A 09/07/2012   Procedure: ATRIAL FIBRILLATION ABLATION;  Surgeon: Thompson Grayer, MD;   Location: [facility] CATH LAB;  Service: Cardiovascular;  Laterality: N/A;  . CARDIAC CATHETERIZATION Left 07/20/2006   No significant CAD, EF 60%, continue medical therapy  . COLONOSCOPY    . EP IMPLANTABLE DEVICE N/A 01/23/2015   Procedure: Loop Recorder Insertion;  Surgeon: Thompson Grayer, MD;  Location: [place] CV LAB;  Service: Cardiovascular;  Laterality: N/A;  . KNEE ARTHROSCOPY    . TEE WITHOUT CARDIOVERSION N/A 09/06/2012   Procedure: TRANSESOPHAGEAL ECHOCARDIOGRAM (TEE);  Surgeon: Pixie Casino, MD;  Location: [facility] ENDOSCOPY;  Service: Cardiovascular;  Laterality: N/A;  . UPPER GASTROINTESTINAL ENDOSCOPY      Current Outpatient Medications  Medication Sig Dispense Refill  . diltiazem (CARDIZEM) 30 MG tablet Take 1 tablet (30 mg total) by mouth as needed. 15 tablet 3  . metoprolol tartrate (LOPRESSOR) 50 MG tablet Take 1 tablet by mouth twice daily 180 tablet 3  . omeprazole (PRILOSEC) 20 MG capsule Take 20 mg by mouth daily.    . rosuvastatin (CRESTOR) 10 MG tablet Take 1 tablet by mouth once daily 90 tablet 0  . XARELTO 20 MG TABS tablet TAKE 1 TABLET BY MOUTH ONCE DAILY WITH SUPPER 90 tablet 1   No current facility-administered medications for this visit.     Allergies:   Patient has no known allergies.   Social History:  The patient  reports that he quit smoking about 24 years ago. His smoking use included cigarettes. He quit  after 35.00 years of use. He has never used smokeless tobacco. He reports current alcohol use of about 7.0 standard drinks of alcohol per week. He reports that he does not use drugs.   Family History:  The patient's family history includes CAD in his father; Heart Problems in his father; Heart Problems (age of onset: 67) in his brother; Hypertension in his father; Pancreatic cancer in his mother; Stroke in his father.   ROS:  Please see the history of present illness.   All other systems are personally reviewed and negative.    Exam:    Vital Signs:  Ht 5'  11" (1.803 m)   Wt 170 lb (77.1 kg)   BMI 23.71 kg/m   Well sounding   Labs/Other Tests and Data Reviewed:    Recent Labs: No results found for requested labs within last 8760 hours.   Wt Readings from Last 3 Encounters:  12/01/18 170 lb (77.1 kg)  09/01/18 170 lb (77.1 kg)  04/23/18 177 lb 3.2 oz (80.4 kg)    ASSESSMENT & PLAN:    1.  paroxysmal atrial fibrillation Well controlled post ablation His ILR is at EOL He and I agree that long term monitoring is a good tool for afib management post ablation and to further evaluate his palpitations and sensation of "heart pauses".   Risks and benefits to removal of his old ILR and placement of a new one were discussed at length.  He wishes to proceed.  2. HTN Stable No change required today  Follow-up:  In office ILR placement at the next available time.   Patient Risk:  after full review of this patients clinical status, I feel that they are at moderate risk at this time.  Today, I have spent 15 minutes with the patient with telehealth technology discussing arrhythmia management .    Army Fossa, MD  12/01/2018 9:53 AM     [place] [address] [phone] (office) [phone] (fax)

## 2018-12-22 ENCOUNTER — Telehealth: Payer: Self-pay

## 2018-12-24 ENCOUNTER — Other Ambulatory Visit: Payer: Self-pay

## 2018-12-24 ENCOUNTER — Ambulatory Visit: Payer: PPO | Admitting: Internal Medicine

## 2018-12-24 ENCOUNTER — Encounter: Payer: Self-pay | Admitting: Internal Medicine

## 2018-12-24 ENCOUNTER — Encounter: Payer: PPO | Admitting: Internal Medicine

## 2018-12-24 VITALS — BP 140/76 | HR 62 | Ht 71.0 in | Wt 176.0 lb

## 2018-12-24 DIAGNOSIS — Z95818 Presence of other cardiac implants and grafts: Secondary | ICD-10-CM | POA: Diagnosis not present

## 2018-12-24 DIAGNOSIS — I10 Essential (primary) hypertension: Secondary | ICD-10-CM

## 2018-12-24 DIAGNOSIS — I480 Paroxysmal atrial fibrillation: Secondary | ICD-10-CM

## 2018-12-24 HISTORY — PX: OTHER SURGICAL HISTORY: SHX169

## 2018-12-24 NOTE — Patient Instructions (Addendum)
Medication Instructions:  Your physician recommends that you continue on your current medications as directed. Please refer to the Current Medication list given to you today.  Labwork: None ordered.  Testing/Procedures: None ordered.  Follow-Up:  Your physician wants you to follow-up in: 12 months with Dr. [name].      Implantable Loop Recorder Placement, Care After Refer to this sheet in the next few weeks. These instructions provide you with information about caring for yourself after your procedure. Your health care provider may also give you more specific instructions. Your treatment has been planned according to current medical practices, but problems sometimes occur. Call your health care provider if you have any problems or questions after your procedure. What can I expect after the procedure? After the procedure, it is common to have:  Soreness or pain near the cut from surgery (incision).  Some swelling or bruising near the incision.  Follow these instructions at home:  Medicines  Take over-the-counter and prescription medicines only as told by your health care provider.  If you were prescribed an antibiotic medicine, take it as told by your health care provider. Do not stop taking the antibiotic even if you start to feel better.  Bathing Do not take baths, swim, or use a hot tub until your health care provider approves. You may shower 24 hours after removal of your monitor.  Incision care  Follow instructions from your health care provider about how to take care of your incision. Make sure you: ? Remove your top dressing after 24 hours (before you shower) ? Leave stitches (sutures), skin glue, or adhesive strips in place. These skin closures may need to stay in place for 2 weeks or longer. If adhesive strip edges start to loosen and curl up, you may trim the loose edges. Do not remove adhesive strips completely unless your health care provider tells you to do  that.  Check your incision area every day for signs of infection. Check for: ? More redness, swelling, or pain. ? Fluid or blood. ? Warmth. ? Pus or a bad smell.  Contact a health care provider if:  You have more redness, swelling, or pain around your incision.  You have more fluid or blood coming from your incision.  Your incision feels warm to the touch.  You have pus or a bad smell coming from your incision.  You have a fever.  You have pain that is not relieved by your pain medicine.  You have triggered your device because of fainting (syncope) or because of a heartbeat that feels like it is racing, slow, fluttering, or skipping (palpitations).

## 2018-12-24 NOTE — Progress Notes (Addendum)
PCP: Levin Erp, MD   Primary EP: Dr [name]  Luis [name] is a 73 y.o. male who presents today for routine electrophysiology followup.  Since last being seen in our clinic, the patient reports doing very well.  Today, he denies symptoms of palpitations, chest pain, shortness of breath,  lower extremity edema, dizziness, presyncope, or syncope.  The patient is otherwise without complaint today.   Past Medical History:  Diagnosis Date  . Allergy    SEASONAL  . Arthritis    BACK AND KNEES  . GERD (gastroesophageal reflux disease)   . History of kidney stones 07/2006   x 1  . Hyperlipidemia   . Hypertension   . Normal coronary arteries 2008   Normal LV function  . Paroxysmal atrial fibrillation ([place])    2008 and 20014, PVI 09/07/12   Past Surgical History:  Procedure Laterality Date  . ATRIAL ABLATION SURGERY  09/07/12   PVI by Dr [name]  . ATRIAL FIBRILLATION ABLATION N/A 09/07/2012   Procedure: ATRIAL FIBRILLATION ABLATION;  Surgeon: Thompson Grayer, MD;  Location[facility] CATH LAB;  Service: Cardiovascular;  Laterality: N/A;  . CARDIAC CATHETERIZATION Left 07/20/2006   No significant CAD, EF 60%, continue medical therapy  . COLONOSCOPY    . EP IMPLANTABLE DEVICE N/A 01/23/2015   Procedure: Loop Recorder Insertion;  Surgeon: Thompson Grayer, MD;  Location: [place] CV LAB;  Service: Cardiovascular;  Laterality: N/A;  . KNEE ARTHROSCOPY    . TEE WITHOUT CARDIOVERSION N/A 09/06/2012   Procedure: TRANSESOPHAGEAL ECHOCARDIOGRAM (TEE);  Surgeon: Pixie Casino, MD;  Location: [facility] ENDOSCOPY;  Service: Cardiovascular;  Laterality: N/A;  . UPPER GASTROINTESTINAL ENDOSCOPY      ROS- all systems are reviewed and negatives except as per HPI above  Current Outpatient Medications  Medication Sig Dispense Refill  . diltiazem (CARDIZEM) 30 MG tablet Take 1 tablet (30 mg total) by mouth as needed. 15 tablet 3  . metoprolol tartrate (LOPRESSOR) 50 MG tablet Take 1 tablet by mouth twice daily 180  tablet 3  . omeprazole (PRILOSEC) 20 MG capsule Take 20 mg by mouth daily.    . rosuvastatin (CRESTOR) 10 MG tablet Take 1 tablet by mouth once daily 90 tablet 0  . XARELTO 20 MG TABS tablet TAKE 1 TABLET BY MOUTH ONCE DAILY WITH SUPPER 90 tablet 1   No current facility-administered medications for this visit.     Physical Exam: Vitals:   12/24/18 0815  BP: 140/76  Pulse: 62  SpO2: 99%  Weight: 176 lb (79.8 kg)  Height: 5\' 11"  (1.803 m)    GEN- The patient is well appearing, alert and oriented x 3 today.   Head- normocephalic, atraumatic Eyes-  Sclera clear, conjunctiva pink Ears- hearing intact Oropharynx- clear Lungs- Clear to ausculation bilaterally, normal work of breathing Heart- Regular rate and rhythm, no murmurs, rubs or gallops, PMI not laterally displaced GI- soft, NT, ND, + BS Extremities- no clubbing, cyanosis, or edema  Wt Readings from Last 3 Encounters:  12/24/18 176 lb (79.8 kg)  12/01/18 170 lb (77.1 kg)  09/01/18 170 lb (77.1 kg)    Assessment and Plan:  1. Paroxysmal fibrillation Doing great post ablation 2014 His ILR is at RRT.  He is very clear that he would like new ILR implanted.  I agree that ILR has been very beneficial for AF management post ablation and for further evaluation of palpitations He is on xarelto for chads2vasc score of 3. Risks and benefits to ILR  removal with new ILR implanted were discussed at length with the patient who wishes to proceed  2. HTN Stable No change required today  Thompson Grayer MD, [facility] 12/24/2018 8:48 AM     ILR removal with new device implanted:  Informed written consent was obtained.  The patient required no sedation for the procedure today.   The patients left chest was therefore prepped and draped in the usual sterile fashion.  The skin overlying the ILR monitor was infiltrated with lidocaine for local analgesia.  A 0.5-cm incision was made over the site.  The previously implanted ILR was exposed and removed  using a combination of sharp and blunt dissection. A new Medtronic Reveal LINQ 2 [id number]) was implanted in its place.   Steri- Strips and a sterile dressing were then applied. EBL<53ml.  There were no early apparent complications.     CONCLUSIONS:   1. Successful explantation of a Medtronic Reveal LINQ implantable loop recorder with reimplantation of a Medtronic Reveal LINQ 2  2. No early apparent complications.        Thompson Grayer MD, [facility] 12/24/2018 8:50 AM

## 2019-01-03 ENCOUNTER — Other Ambulatory Visit: Payer: Self-pay | Admitting: Cardiovascular Disease

## 2019-01-03 NOTE — Telephone Encounter (Signed)
18m, 79.8 kg, Scr 1.19 12/16/18, ccr 60.96mlmin, lovw/allred 12/24/18

## 2019-01-04 DIAGNOSIS — M25561 Pain in right knee: Secondary | ICD-10-CM | POA: Diagnosis not present

## 2019-01-04 DIAGNOSIS — M25562 Pain in left knee: Secondary | ICD-10-CM | POA: Diagnosis not present

## 2019-01-05 ENCOUNTER — Other Ambulatory Visit: Payer: Self-pay

## 2019-01-05 ENCOUNTER — Encounter: Payer: Self-pay | Admitting: *Deleted

## 2019-01-05 ENCOUNTER — Telehealth: Payer: PPO | Admitting: *Deleted

## 2019-01-05 DIAGNOSIS — I480 Paroxysmal atrial fibrillation: Secondary | ICD-10-CM

## 2019-01-05 NOTE — Progress Notes (Signed)
Patient verbally consented to loop recorder wound check via virtual visit due to COVID-19.  LINQ II wound check via FaceTime (per patient request). Steri-strips removed during visit. Incision edges appear fully approximated, wound healing well. No signs/symptoms of infection noted. Carelink app up to date with daily transmission. No episodes since implant. Patient educated about wound care and Carelink app. Patient denies additional questions at this time. Monthly summary reports and ROV with Dr. [name] on 12/19/2019.

## 2019-01-05 NOTE — Telephone Encounter (Signed)
Virtual visit wound check completed. See encounter note.

## 2019-01-18 DIAGNOSIS — Z23 Encounter for immunization: Secondary | ICD-10-CM | POA: Diagnosis not present

## 2019-01-25 ENCOUNTER — Other Ambulatory Visit: Payer: Self-pay | Admitting: Cardiovascular Disease

## 2019-01-26 ENCOUNTER — Ambulatory Visit (INDEPENDENT_AMBULATORY_CARE_PROVIDER_SITE_OTHER): Payer: PPO | Admitting: *Deleted

## 2019-01-26 DIAGNOSIS — I480 Paroxysmal atrial fibrillation: Secondary | ICD-10-CM

## 2019-01-27 LAB — CUP PACEART REMOTE DEVICE CHECK
Date Time Interrogation Session: 20201021145849
Implantable Pulse Generator Implant Date: 20200918

## 2019-02-09 NOTE — Progress Notes (Signed)
Carelink Summary Report / Loop Recorder 

## 2019-02-28 ENCOUNTER — Ambulatory Visit (HOSPITAL_COMMUNITY): Payer: PPO

## 2019-02-28 ENCOUNTER — Ambulatory Visit (HOSPITAL_COMMUNITY): Payer: PPO | Admitting: Anesthesiology

## 2019-02-28 ENCOUNTER — Encounter (HOSPITAL_COMMUNITY)
Admission: AD | Disposition: A | Discharge status: Home / Self Care | Payer: Self-pay | Source: Ambulatory Visit | Attending: Urology

## 2019-02-28 ENCOUNTER — Ambulatory Visit (INDEPENDENT_AMBULATORY_CARE_PROVIDER_SITE_OTHER): Payer: PPO | Admitting: *Deleted

## 2019-02-28 ENCOUNTER — Other Ambulatory Visit: Payer: Self-pay

## 2019-02-28 ENCOUNTER — Ambulatory Visit (HOSPITAL_COMMUNITY)
Admission: AD | Admit: 2019-02-28 | Discharge: 2019-02-28 | Disposition: A | Discharge status: Home / Self Care | Payer: PPO | Source: Ambulatory Visit | Attending: Urology | Admitting: Urology

## 2019-02-28 ENCOUNTER — Other Ambulatory Visit: Payer: Self-pay | Admitting: Urology

## 2019-02-28 ENCOUNTER — Encounter (HOSPITAL_COMMUNITY): Payer: Self-pay | Admitting: *Deleted

## 2019-02-28 DIAGNOSIS — Z20828 Contact with and (suspected) exposure to other viral communicable diseases: Secondary | ICD-10-CM | POA: Insufficient documentation

## 2019-02-28 DIAGNOSIS — Z87891 Personal history of nicotine dependence: Secondary | ICD-10-CM | POA: Insufficient documentation

## 2019-02-28 DIAGNOSIS — I480 Paroxysmal atrial fibrillation: Secondary | ICD-10-CM | POA: Diagnosis not present

## 2019-02-28 DIAGNOSIS — I10 Essential (primary) hypertension: Secondary | ICD-10-CM | POA: Diagnosis not present

## 2019-02-28 DIAGNOSIS — E785 Hyperlipidemia, unspecified: Secondary | ICD-10-CM | POA: Diagnosis not present

## 2019-02-28 DIAGNOSIS — Z87442 Personal history of urinary calculi: Secondary | ICD-10-CM | POA: Insufficient documentation

## 2019-02-28 DIAGNOSIS — N202 Calculus of kidney with calculus of ureter: Secondary | ICD-10-CM | POA: Diagnosis not present

## 2019-02-28 DIAGNOSIS — Z7901 Long term (current) use of anticoagulants: Secondary | ICD-10-CM | POA: Insufficient documentation

## 2019-02-28 DIAGNOSIS — N132 Hydronephrosis with renal and ureteral calculous obstruction: Secondary | ICD-10-CM | POA: Diagnosis not present

## 2019-02-28 DIAGNOSIS — R319 Hematuria, unspecified: Secondary | ICD-10-CM | POA: Diagnosis not present

## 2019-02-28 DIAGNOSIS — N201 Calculus of ureter: Secondary | ICD-10-CM | POA: Diagnosis not present

## 2019-02-28 DIAGNOSIS — Z79899 Other long term (current) drug therapy: Secondary | ICD-10-CM | POA: Diagnosis not present

## 2019-02-28 DIAGNOSIS — K219 Gastro-esophageal reflux disease without esophagitis: Secondary | ICD-10-CM | POA: Insufficient documentation

## 2019-02-28 DIAGNOSIS — R1084 Generalized abdominal pain: Secondary | ICD-10-CM | POA: Diagnosis not present

## 2019-02-28 DIAGNOSIS — R310 Gross hematuria: Secondary | ICD-10-CM | POA: Diagnosis not present

## 2019-02-28 HISTORY — DX: Other complications of anesthesia, initial encounter: T88.59XA

## 2019-02-28 HISTORY — DX: Cardiac arrhythmia, unspecified: I49.9

## 2019-02-28 HISTORY — PX: CYSTOSCOPY W/ RETROGRADES: SHX1426

## 2019-02-28 LAB — BASIC METABOLIC PANEL
Anion gap: 8 (ref 5–15)
BUN: 16 mg/dL (ref 8–23)
CO2: 24 mmol/L (ref 22–32)
Calcium: 9.5 mg/dL (ref 8.9–10.3)
Chloride: 108 mmol/L (ref 98–111)
Creatinine, Ser: 1.21 mg/dL (ref 0.61–1.24)
GFR calc Af Amer: >60 mL/min (ref >60)
GFR calc non Af Amer: 58 mL/min — ABNORMAL LOW (ref >60)
Glucose, Bld: 98 mg/dL (ref 70–99)
Potassium: 4 mmol/L (ref 3.5–5.1)
Sodium: 140 mmol/L (ref 135–145)

## 2019-02-28 LAB — CUP PACEART REMOTE DEVICE CHECK
Date Time Interrogation Session: 20201123110140
Implantable Pulse Generator Implant Date: 20200918

## 2019-02-28 LAB — PROTIME-INR
INR: 1.4 — ABNORMAL HIGH (ref 0.8–1.2)
Prothrombin Time: 16.8 seconds — ABNORMAL HIGH (ref 11.4–15.2)

## 2019-02-28 LAB — CBC
HCT: 48.9 % (ref 39.0–52.0)
Hemoglobin: 16.5 g/dL (ref 13.0–17.0)
MCH: 32.8 pg (ref 26.0–34.0)
MCHC: 33.7 g/dL (ref 30.0–36.0)
MCV: 97.2 fL (ref 80.0–100.0)
Platelets: 150 10*3/uL (ref 150–400)
RBC: 5.03 MIL/uL (ref 4.22–5.81)
RDW: 13.1 % (ref 11.5–15.5)
WBC: 8 10*3/uL (ref 4.0–10.5)
nRBC: 0 % (ref 0.0–0.2)

## 2019-02-28 LAB — SARS CORONAVIRUS 2 BY RT PCR (HOSPITAL ORDER, PERFORMED IN ~~LOC~~ HOSPITAL LAB): SARS Coronavirus 2: NEGATIVE

## 2019-02-28 SURGERY — CYSTOSCOPY, WITH RETROGRADE PYELOGRAM
Anesthesia: General | Laterality: Left

## 2019-02-28 MED ORDER — DEXAMETHASONE SODIUM PHOSPHATE 10 MG/ML IJ SOLN
INTRAMUSCULAR | Status: DC | PRN
  Administered 2019-02-28: 8 mg via INTRAVENOUS

## 2019-02-28 MED ORDER — LIDOCAINE 2% (20 MG/ML) 5 ML SYRINGE
INTRAMUSCULAR | Status: DC | PRN
  Administered 2019-02-28: 80 mg via INTRAVENOUS

## 2019-02-28 MED ORDER — OXYBUTYNIN CHLORIDE ER 10 MG PO TB24: 10.0000 mg | ORAL_TABLET | Freq: Every day | ORAL | 2 refills | Status: DC

## 2019-02-28 MED ORDER — FENTANYL CITRATE (PF) 100 MCG/2ML IJ SOLN
INTRAMUSCULAR | Status: DC | PRN
  Administered 2019-02-28 (×2): 50 ug via INTRAVENOUS

## 2019-02-28 MED ORDER — CELECOXIB 200 MG PO CAPS: 400.0000 mg | ORAL_CAPSULE | Freq: Once | ORAL | Status: DC

## 2019-02-28 MED ORDER — STERILE WATER FOR IRRIGATION IR SOLN
Status: DC | PRN
  Administered 2019-02-28: 1000 mL

## 2019-02-28 MED ORDER — ROCURONIUM BROMIDE 10 MG/ML (PF) SYRINGE
PREFILLED_SYRINGE | INTRAVENOUS | Status: AC
  Filled 2019-02-28: qty 10

## 2019-02-28 MED ORDER — PROMETHAZINE HCL 25 MG/ML IJ SOLN: 6.2500 mg | INTRAMUSCULAR | Status: DC | PRN

## 2019-02-28 MED ORDER — PROPOFOL 10 MG/ML IV BOLUS
INTRAVENOUS | Status: AC
  Filled 2019-02-28: qty 20

## 2019-02-28 MED ORDER — DEXAMETHASONE SODIUM PHOSPHATE 10 MG/ML IJ SOLN
INTRAMUSCULAR | Status: AC
  Filled 2019-02-28: qty 1

## 2019-02-28 MED ORDER — FENTANYL CITRATE (PF) 100 MCG/2ML IJ SOLN
INTRAMUSCULAR | Status: AC
  Filled 2019-02-28: qty 2

## 2019-02-28 MED ORDER — CIPROFLOXACIN IN D5W 400 MG/200ML IV SOLN
400.0000 mg | INTRAVENOUS | Status: AC
  Administered 2019-02-28: 400 mg via INTRAVENOUS
  Filled 2019-02-28: qty 200

## 2019-02-28 MED ORDER — OXYBUTYNIN CHLORIDE 5 MG PO TABS
ORAL_TABLET | ORAL | Status: AC
  Filled 2019-02-28: qty 1

## 2019-02-28 MED ORDER — ACETAMINOPHEN 500 MG PO TABS: 1000.0000 mg | ORAL_TABLET | Freq: Once | ORAL | Status: DC

## 2019-02-28 MED ORDER — ONDANSETRON HCL 4 MG/2ML IJ SOLN
INTRAMUSCULAR | Status: DC | PRN
  Administered 2019-02-28: 4 mg via INTRAVENOUS

## 2019-02-28 MED ORDER — SUCCINYLCHOLINE CHLORIDE 200 MG/10ML IV SOSY
PREFILLED_SYRINGE | INTRAVENOUS | Status: DC | PRN
  Administered 2019-02-28: 140 mg via INTRAVENOUS

## 2019-02-28 MED ORDER — LACTATED RINGERS IV SOLN
INTRAVENOUS | Status: DC
  Administered 2019-02-28: 17:00:00 via INTRAVENOUS

## 2019-02-28 MED ORDER — ONDANSETRON HCL 4 MG/2ML IJ SOLN
INTRAMUSCULAR | Status: AC
  Filled 2019-02-28: qty 2

## 2019-02-28 MED ORDER — LIDOCAINE 2% (20 MG/ML) 5 ML SYRINGE
INTRAMUSCULAR | Status: AC
  Filled 2019-02-28: qty 5

## 2019-02-28 MED ORDER — FENTANYL CITRATE (PF) 100 MCG/2ML IJ SOLN: 25.0000 ug | INTRAMUSCULAR | Status: DC | PRN

## 2019-02-28 MED ORDER — PROPOFOL 10 MG/ML IV BOLUS
INTRAVENOUS | Status: DC | PRN
  Administered 2019-02-28: 150 mg via INTRAVENOUS

## 2019-02-28 SURGICAL SUPPLY — 13 items
BAG URO CATCHER STRL LF (MISCELLANEOUS) ×3 IMPLANT
CATH INTERMIT  6FR 70CM (CATHETERS) ×3 IMPLANT
CLOTH BEACON ORANGE TIMEOUT ST (SAFETY) ×3 IMPLANT
GLOVE BIOGEL M STRL SZ7.5 (GLOVE) ×3 IMPLANT
GOWN STRL REUS W/TWL LRG LVL3 (GOWN DISPOSABLE) ×6 IMPLANT
GUIDEWIRE STR DUAL SENSOR (WIRE) ×3 IMPLANT
KIT TURNOVER KIT A (KITS) ×2 IMPLANT
MANIFOLD NEPTUNE II (INSTRUMENTS) ×3 IMPLANT
PACK CYSTO (CUSTOM PROCEDURE TRAY) ×3 IMPLANT
STENT CONTOUR 6FRX26X.038 (STENTS) ×2 IMPLANT
TUBING CONNECTING 10 (TUBING) ×2 IMPLANT
TUBING CONNECTING 10' (TUBING) ×1
TUBING UROLOGY SET (TUBING) IMPLANT

## 2019-02-28 NOTE — Anesthesia Preprocedure Evaluation (Addendum)
Anesthesia Evaluation  Patient identified by MRN, date of birth, ID band Patient awake    Reviewed: Allergy & Precautions, NPO status , Patient's Chart, lab work & pertinent test results  History of Anesthesia Complications Negative for: history of anesthetic complications  Airway Mallampati: II  TM Distance: >3 FB Neck ROM: Full    Dental  (+) Partial Upper   Pulmonary neg pulmonary ROS, former smoker,    Pulmonary exam normal        Cardiovascular hypertension, Pt. on medications and Pt. on home beta blockers Normal cardiovascular exam+ dysrhythmias Atrial Fibrillation + pacemaker   Study Conclusions   - Left ventricle: The cavity size was normal. Wall thickness  was normal. Systolic function was normal. The estimated  ejection fraction was in the range of 55% to 60%.  - Aortic valve: Structurally normal valve. Trileaflet. Cusp  separation was normal.  - Aorta: The aorta was normal, not dilated, and  non-diseased.  - Mitral valve: Structurally normal valve. Trivial  regurgitation.  - Left atrium: The atrium was dilated. No evidence of  thrombus in the atrial cavity or appendage.  - Right ventricle: The cavity size was normal. Wall  thickness was normal. Systolic function was normal.  - Right atrium: No evidence of thrombus in the atrial cavity  or appendage.  - Atrial septum: No defect or patent foramen ovale was  identified. Echo contrast study showed no right-to-left  atrial level shunt, following an increase in RA pressure  induced by provocative maneuvers.  - Tricuspid valve: Structurally normal valve. Trivial  regurgitation.  - Pulmonic valve: Structurally normal valve. No  regurgitation.    Neuro/Psych negative neurological ROS     GI/Hepatic Neg liver ROS, GERD  ,  Endo/Other  negative endocrine ROS  Renal/GU      Musculoskeletal negative musculoskeletal ROS (+)   Abdominal   Peds  Hematology negative hematology ROS (+)   Anesthesia Other Findings Day of surgery medications reviewed with the patient.  Reproductive/Obstetrics                            Anesthesia Physical Anesthesia Plan  ASA: II  Anesthesia Plan: General   Post-op Pain Management:    Induction: Intravenous, Rapid sequence and Cricoid pressure planned  PONV Risk Score and Plan: 2 and Ondansetron and Dexamethasone  Airway Management Planned: LMA  Additional Equipment:   Intra-op Plan:   Post-operative Plan: Extubation in OR  Informed Consent: I have reviewed the patients History and Physical, chart, labs and discussed the procedure including the risks, benefits and alternatives for the proposed anesthesia with the patient or authorized representative who has indicated his/her understanding and acceptance.     Dental advisory given  Plan Discussed with: CRNA and Anesthesiologist  Anesthesia Plan Comments:        Anesthesia Quick Evaluation

## 2019-02-28 NOTE — Anesthesia Procedure Notes (Addendum)
Procedure Name: Intubation Date/Time: 02/28/2019 6:15 PM Performed by: West Pugh, CRNA Pre-anesthesia Checklist: Patient identified, Emergency Drugs available, Suction available, Patient being monitored and Timeout performed Patient Re-evaluated:Patient Re-evaluated prior to induction Oxygen Delivery Method: Circle system utilized Preoxygenation: Pre-oxygenation with 100% oxygen Induction Type: IV induction, Rapid sequence and Cricoid Pressure applied Laryngoscope Size: Mac and 3 Grade View: Grade I Tube type: Oral Tube size: 7.5 mm Number of attempts: 1 Airway Equipment and Method: Stylet Placement Confirmation: ETT inserted through vocal cords under direct vision,  positive ETCO2,  CO2 detector and breath sounds checked- equal and bilateral Secured at: 22 cm Tube secured with: Tape Dental Injury: Teeth and Oropharynx as per pre-operative assessment

## 2019-02-28 NOTE — H&P (Signed)
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 Karsten Ro comes in today with hematuria since Saturday and left flank pain that started this morning. He has had some weight says nausea but no vomiting. He denies any fevers and chills. He is a very active gentleman.     ALLERGIES: No Allergies    MEDICATIONS: Omeprazole 20 mg capsule,delayed release 1 capsule PO Daily  Crestor 5 mg tablet Oral  Metoprolol Tartrate 25 mg tablet Oral  Xarelto 20 mg tablet     GU PSH: Cystoscopy - 2017 Locm 300-399Mg /Ml Iodine,1Ml - 2017     NON-GU PSH: No Non-GU PSH    GU PMH: Gross hematuria (Worsening, Chronic), Will send urine for cytology. No acute findings on RUS and stable left lower pole renal calculus. Will have pt RTC for possible cysto w/Dr. Karsten Ro. May need repeat CT hematuria protocol. 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 It causes some curvature to the left but he is able to achieve good erections and is able to have intercourse without difficulty. We discussed treatment of this should it get to the point where he is unable to engage in intercourse and we discussed surgical management versus injection therapies. He said it is not bad enough at this point that he would need to consider those options. 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 It certainly is possible that this may contribute to his gross hematuria at times. - 2018 Renal calculus (Improving), Left, He had 2 stones in his kidney previously and appears to have only a single calculus there now. It appears he has  passed one of his stones. He indicates that he goes on vacations that take him far away from sterilization wondered if he could have some pain pills to have on hand in case he passes this final stone. I thought that was reasonable. - 2018, Left, - 2017      PMH Notes: Gross hematuria: He developed gross hematuria in 12/17 on Xarelto for atrial fibrillation. He underwent evaluation with a CT scan and cystoscopy which revealed left renal calculi but no other abnormalities.   10/01/17: He returns today for follow-up of gross hematuria. A renal ultrasound at his last visit revealed no mass or hydronephrosis. A KUB showed a single left lower pole renal calculus. A urine cytology was done and found to be negative.     NON-GU PMH: Personal history of other diseases of the circulatory system, History of hypertension - 2014 Atrial Fibrillation Encounter for general adult medical examination without abnormal findings, Encounter for preventive health examination    FAMILY HISTORY: Cancer - Mother Family Health Status Number - Mother, Runs In Family Heart Disease - Father Urologic Disorder - Father   SOCIAL HISTORY: Marital Status: Married Preferred Language: English; Ethnicity: Not Hispanic Or Latino; Race: White Current Smoking Status: Patient does not smoke anymore.   Tobacco Use Assessment Completed: Used Tobacco in last 30 days? Does not use smokeless tobacco. Drinks 1 drink per day. Types of alcohol consumed: Liquor.  Does not use drugs. Drinks 1 caffeinated drink per day.    REVIEW OF  SYSTEMS:    GU Review Male:   Patient denies frequent urination, hard to postpone urination, burning/ pain with urination, get up at night to urinate, leakage of urine, stream starts and stops, trouble starting your stream, have to strain to urinate , erection problems, and penile pain.  Gastrointestinal (Upper):   Patient denies nausea, vomiting, and indigestion/ heartburn.  Gastrointestinal (Lower):    Patient denies diarrhea and constipation.  Constitutional:   Patient denies fever, night sweats, weight loss, and fatigue.  Skin:   Patient denies skin rash/ lesion and itching.  Eyes:   Patient denies blurred vision and double vision.  Ears/ Nose/ Throat:   Patient denies sore throat and sinus problems.  Hematologic/Lymphatic:   Patient denies swollen glands and easy bruising.  Cardiovascular:   Patient denies leg swelling and chest pains.  Respiratory:   Patient denies cough and shortness of breath.  Endocrine:   Patient denies excessive thirst.  Musculoskeletal:   Patient denies back pain and joint pain.  Neurological:   Patient denies headaches and dizziness.  Psychologic:   Patient denies depression and anxiety.   VITAL SIGNS:      02/28/2019 12:46 PM  Weight 170 lb / 77.11 kg  Height 71 in / 180.34 cm  BP 160/72 mmHg  Pulse 70 /min  BMI 23.7 kg/m   MULTI-SYSTEM PHYSICAL EXAMINATION:    Constitutional: Well-nourished. No physical deformities. Normally developed. Good grooming.  Neck: Neck symmetrical, not swollen. Normal tracheal position.  Respiratory: No labored breathing, no use of accessory muscles.   Cardiovascular: Normal temperature, normal extremity pulses, no swelling, no varicosities.  Skin: No paleness, no jaundice, no cyanosis. No lesion, no ulcer, no rash.  Neurologic / Psychiatric: Oriented to time, oriented to place, oriented to person. No depression, no anxiety, no agitation.  Gastrointestinal: No mass, no tenderness, no rigidity, non obese abdomen.  Eyes: Normal conjunctivae. Normal eyelids.  Ears, Nose, Mouth, and Throat: Left ear no scars, no lesions, no masses. Right ear no scars, no lesions, no masses. Nose no scars, no lesions, no masses. Normal hearing. Normal lips.  Musculoskeletal: Normal gait and station of head and neck. Mild tenderness to palpation left flank     PAST DATA REVIEWED:  Source Of History:  Patient  Records Review:   POC Tool  Urine  Test Review:   Urinalysis  X-Ray Review: KUB: Reviewed Films. Discussed With Patient. Difficult to see patients calculus on KUB C.T. Abdomen/Pelvis: Reviewed Films. Discussed With Patient. I reviewed CT scan which showed a left proximal 4 mm in width by 7 mm longitudinally left ureteral calculus    08/18/06  PSA  Total PSA 0.85    Notes:                     12/15/2017: PSA 1.5, BUN 17   PROCEDURES:         C.T. ABD-Pelv w/o - [id number]      Patient confirmed No Neulasta OnPro Device.            KUB - [id number]  A single view of the abdomen is obtained.      Patient confirmed No Neulasta OnPro Device.            Urinalysis w/Scope Dipstick Dipstick Cont'd Micro  Color: Red Bilirubin: Neg mg/dL WBC/hpf: 6 - 10/hpf  Appearance: Cloudy Ketones: Neg mg/dL RBC/hpf: >60/hpf  Specific Gravity: 1.025 Blood: 3+ ery/uL Bacteria: Few (10-25/hpf)  pH: 5.5 Protein: 1+ mg/dL Cystals: NS (Not  Seen)  Glucose: Neg mg/dL Urobilinogen: 0.2 mg/dL Casts: NS (Not Seen)    Nitrites: Neg Trichomonas: Not Present    Leukocyte Esterase: 2+ leu/uL Mucous: Not Present      Epithelial Cells: 0 - 5/hpf      Yeast: NS (Not Seen)      Sperm: Not Present    Notes: MICROSCOPIC NOT CONCENTRATED.    ASSESSMENT:      ICD-10 Details  1 GU:   Gross hematuria - R31.0 Stable  2   Renal and ureteral calculus - N20.2 Had a lengthy discussion with the patient about the findings on the CT scan and management options including medical expulsive therapy, ureteroscopy, and ESWL. Over the course of the afternoon the patient states that he is unable to proceed with medical expulsive therapy secondary to pain and would like a stent. The risks and benefits of the stent were discussed including pain, bleeding, infection, need for staged procedure, damage to surrounding structures, inability remove the stone. He understands these risks and has elected to proceed.   PLAN:    Cystoscopy with left ureteral stent placement today

## 2019-02-28 NOTE — Transfer of Care (Signed)
Immediate Anesthesia Transfer of Care Note  Patient: Luis [name]  Procedure(s) Performed: CYSTOSCOPY WITH RETROGRADE PYELOGRAM (Left )  Patient Location: PACU  Anesthesia Type:General  Level of Consciousness: awake, alert  and patient cooperative  Airway & Oxygen Therapy: Patient Spontanous Breathing and Patient connected to face mask oxygen  Post-op Assessment: Report given to RN and Post -op Vital signs reviewed and stable  Post vital signs: Reviewed and stable  Last Vitals:  Vitals Value Taken Time  BP 144/78 02/28/19 1838  Temp    Pulse 79 02/28/19 1840  Resp 15 02/28/19 1840  SpO2 100 % 02/28/19 1840  Vitals shown include unvalidated device data.  Last Pain:  Vitals:   02/28/19 1839  TempSrc:   PainSc: (P) Asleep      Patients Stated Pain Goal: 3 ([id number])  Complications: No apparent anesthesia complications

## 2019-02-28 NOTE — Discharge Instructions (Signed)
Ureteral Stent Implantation, Care After °This sheet gives you information about how to care for yourself after your procedure. Your health care provider may also give you more specific instructions. If you have problems or questions, contact your health care provider. °What can I expect after the procedure? °After the procedure, it is common to have: °· Nausea. °· Mild pain when you urinate. You may feel this pain in your lower back or lower abdomen. The pain should stop within a few minutes after you urinate. This may last for up to 1 week. °· A small amount of blood in your urine for several days. °Follow these instructions at home: °Medicines °· Take over-the-counter and prescription medicines only as told by your health care provider. °· If you were prescribed an antibiotic medicine, take it as told by your health care provider. Do not stop taking the antibiotic even if you start to feel better. °· Do not drive for 24 hours if you were given a sedative during your procedure. °· Ask your health care provider if the medicine prescribed to you requires you to avoid driving or using heavy machinery. °Activity °· Rest as told by your health care provider. °· Avoid sitting for a long time without moving. Get up to take short walks every 1-2 hours. This is important to improve blood flow and breathing. Ask for help if you feel weak or unsteady. °· Return to your normal activities as told by your health care provider. Ask your health care provider what activities are safe for you. °General instructions ° °· Watch for any blood in your urine. Call your health care provider if the amount of blood in your urine increases. °· If you have a catheter: °? Follow instructions from your health care provider about taking care of your catheter and collection bag. °? Do not take baths, swim, or use a hot tub until your health care provider approves. Ask your health care provider if you may take showers. You may only be allowed to  take sponge baths. °· Drink enough fluid to keep your urine pale yellow. °· Do not use any products that contain nicotine or tobacco, such as cigarettes, e-cigarettes, and chewing tobacco. These can delay healing after surgery. If you need help quitting, ask your health care provider. °· Keep all follow-up visits as told by your health care provider. This is important. °Contact a health care provider if: °· You have pain that gets worse or does not get better with medicine, especially pain when you urinate. °· You have difficulty urinating. °· You feel nauseous or you vomit repeatedly during a period of more than 2 days after the procedure. °Get help right away if: °· Your urine is dark red or has blood clots in it. °· You are leaking urine (have incontinence). °· The end of the stent comes out of your urethra. °· You cannot urinate. °· You have sudden, sharp, or severe pain in your abdomen or lower back. °· You have a fever. °· You have swelling or pain in your legs. °· You have difficulty breathing. °Summary °· After the procedure, it is common to have mild pain when you urinate that goes away within a few minutes after you urinate. This may last for up to 1 week. °· Watch for any blood in your urine. Call your health care provider if the amount of blood in your urine increases. °· Take over-the-counter and prescription medicines only as told by your health care provider. °· Drink   enough fluid to keep your urine pale yellow. This information is not intended to replace advice given to you by your health care provider. Make sure you discuss any questions you have with your health care provider. Document Released: 11/24/2012 Document Revised: 12/29/2017 Document Reviewed: 12/30/2017 Elsevier Patient Education  2020 [place] stent placement instructions   Definitions:  Ureter: The duct that transports urine from the kidney to the bladder. Stent: A plastic hollow tube that is placed into the  ureter, from the kidney to the bladder to prevent the ureter from swelling shut.  General instructions:  Despite the fact that no skin incisions were used, the area around the ureter and bladder is raw and irritated. The stent is a foreign body which can further irritate the bladder wall. This irritation is manifested by increased frequency of urination, both day and night, and by an increase in the urge to urinate. In some, the urge to urinate is present almost always. Sometimes the urge is strong enough that you may not be able to stop your self from urinating. This can often be controlled with medication but does not occur in everyone. A stent can safely be left in place for 3 months or greater.  You may see some blood in your urine while the stent is in place and a few days afterward. Do not be alarmed, even if the urine is clear for a while. Get off your feet and drink lots of fluids until clearing occurs. If you start to pass clots or don't improve, call us.  Diet:  You may return to your normal diet immediately. Because of the raw surface of your bladder, alcohol, spicy foods, foods high in acid and drinks with caffeine may cause irritation or frequency and should be used in moderation. To keep your urine flowing freely and avoid constipation, drink plenty of fluids during the day (8-10 glasses). Tip: Avoid cranberry juice because it is very acidic.  Activity:  Your physical activity doesn't need to be restricted. However, if you are very active, you may see some blood in the urine. We suggest that you reduce your activity under the circumstances until the bleeding has stopped.  Bowels:  It is important to keep your bowels regular during the postoperative period. Straining with bowel movements can cause bleeding. A bowel movement every other day is reasonable. Use a mild laxative if needed, such as milk of magnesia 2-3 tablespoons, or 2 Dulcolax tablets. Call if you continue to have  problems. If you had been taking narcotics for pain, before, during or after your surgery, you may be constipated. Take a laxative if necessary.  Medication:  You should resume your pre-surgery medications unless told not to. In addition you may be given an antibiotic to prevent or treat infection. Antibiotics are not always necessary. All medication should be taken as prescribed until the bottles are finished unless you are having an unusual reaction to one of the drugs.  Problems you should report to [place]:  a. Fever greater than 101F. b. Heavy bleeding, or clots (see notes above about blood in urine). c. Inability to urinate. d. Drug reactions (hives, rash, nausea, vomiting, diarrhea). e. Severe burning or pain with urination that is not improving.    You will be contacted by [facility] with your next surgery date.

## 2019-02-28 NOTE — Op Note (Signed)
PATIENT:  MAJEED TAMASHIRO  Preoperative diagnosis:  1. Left ureteral calculus  Postoperative diagnosis:  1. same  Procedure:  1. Cystoscopy 2. Right ureteral stent placement (6Fr x 26cm)    Surgeon: Jacalyn Lefevre, M.D.  Findings: 1.  Normal anterior urethra 2.  Mild lateral lobe hypertrophy no intravesical median lobe 3.  Bladder mucosa normal without masses/erythema/stones 4.  Cloudy urine drained from left ureteral orifice after cannulating with wire 5.  6 [place] by 26 cm left JJ ureteral stent  Antibiotics: 400 mg IV Cipro  Anesthesia: General  Complications: None  EBL: Minimal  Specimens: None  Indication: 73 year old man presented to the office with left flank pain and CT scan showed a 7 x 4 mm left proximal ureteral calculus.  Urinalysis with RBCs, WBCs and bacteria.  Description of procedure: Risks and benefits of the procedure were discussed with the patient and informed consent was obtained.  The patient was taken to the operating room and general anesthesia was induced.  The patient was placed in the dorsal lithotomy position, prepped and draped in the usual sterile fashion, and preoperative antibiotics were administered. A preoperative time-out was performed.   Cystourethroscopy was performed.  The patient's urethra was examined and was normal. The bladder was then systematically examined in its entirety. There was no evidence for any bladder tumors, stones, or other mucosal pathology.    Attention then turned to the left ureteral orifice and a 0.35 sensor wire was used to cannulate the orifice.  The wire was advanced to the kidney with fluoroscopic guidance.  Next a 6 [place] by 26 cm ureteral stent was then placed over the wire with fluoroscopic and cystoscopic guidance.  The wire was removed fluoroscopy demonstrated a proximal curl in the kidney and a distal curl was seen in the bladder under direct visualization.  The bladder was decompressed and the cystoscope  was removed.  The patient appeared to tolerate the procedure well and without complications.  The patient was able to be awakened and transferred to the recovery unit in satisfactory condition.    Disposition following procedure: Stable  Follow-up: The patient be scheduled for definitive stone management with ureteroscopy in approximately 2 weeks.

## 2019-02-28 NOTE — Interval H&P Note (Signed)
History and Physical Interval Note:  02/28/2019 5:19 PM  Luis [name]  has presented today for surgery, with the diagnosis of left kidney stone.  The various methods of treatment have been discussed with the patient and family. After consideration of risks, benefits and other options for treatment, the patient has consented to  Procedure(s): CYSTOSCOPY WITH RETROGRADE PYELOGRAM (Left) as a surgical intervention.  The patient's history has been reviewed, patient examined, no change in status, stable for surgery.  I have reviewed the patient's chart and labs.  Questions were answered to the patient's satisfaction.     Reyah Streeter D Lindy Garczynski

## 2019-02-28 NOTE — H&P (View-Only) (Signed)
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 Karsten Ro comes in today with hematuria since Saturday and left flank pain that started this morning. He has had some weight says nausea but no vomiting. He denies any fevers and chills. He is a very active gentleman.     ALLERGIES: No Allergies    MEDICATIONS: Omeprazole 20 mg capsule,delayed release 1 capsule PO Daily  Crestor 5 mg tablet Oral  Metoprolol Tartrate 25 mg tablet Oral  Xarelto 20 mg tablet     GU PSH: Cystoscopy - 2017 Locm 300-399Mg /Ml Iodine,1Ml - 2017     NON-GU PSH: No Non-GU PSH    GU PMH: Gross hematuria (Worsening, Chronic), Will send urine for cytology. No acute findings on RUS and stable left lower pole renal calculus. Will have pt RTC for possible cysto w/Dr. Karsten Ro. May need repeat CT hematuria protocol. 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 It causes some curvature to the left but he is able to achieve good erections and is able to have intercourse without difficulty. We discussed treatment of this should it get to the point where he is unable to engage in intercourse and we discussed surgical management versus injection therapies. He said it is not bad enough at this point that he would need to consider those options. 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 It certainly is possible that this may contribute to his gross hematuria at times. - 2018 Renal calculus (Improving), Left, He had 2 stones in his kidney previously and appears to have only a single calculus there now. It appears he has  passed one of his stones. He indicates that he goes on vacations that take him far away from sterilization wondered if he could have some pain pills to have on hand in case he passes this final stone. I thought that was reasonable. - 2018, Left, - 2017      PMH Notes: Gross hematuria: He developed gross hematuria in 12/17 on Xarelto for atrial fibrillation. He underwent evaluation with a CT scan and cystoscopy which revealed left renal calculi but no other abnormalities.   10/01/17: He returns today for follow-up of gross hematuria. A renal ultrasound at his last visit revealed no mass or hydronephrosis. A KUB showed a single left lower pole renal calculus. A urine cytology was done and found to be negative.     NON-GU PMH: Personal history of other diseases of the circulatory system, History of hypertension - 2014 Atrial Fibrillation Encounter for general adult medical examination without abnormal findings, Encounter for preventive health examination    FAMILY HISTORY: Cancer - Mother Family Health Status Number - Mother, Runs In Family Heart Disease - Father Urologic Disorder - Father   SOCIAL HISTORY: Marital Status: Married Preferred Language: English; Ethnicity: Not Hispanic Or Latino; Race: White Current Smoking Status: Patient does not smoke anymore.   Tobacco Use Assessment Completed: Used Tobacco in last 30 days? Does not use smokeless tobacco. Drinks 1 drink per day. Types of alcohol consumed: Liquor.  Does not use drugs. Drinks 1 caffeinated drink per day.    REVIEW OF  SYSTEMS:    GU Review Male:   Patient denies frequent urination, hard to postpone urination, burning/ pain with urination, get up at night to urinate, leakage of urine, stream starts and stops, trouble starting your stream, have to strain to urinate , erection problems, and penile pain.  Gastrointestinal (Upper):   Patient denies nausea, vomiting, and indigestion/ heartburn.  Gastrointestinal (Lower):    Patient denies diarrhea and constipation.  Constitutional:   Patient denies fever, night sweats, weight loss, and fatigue.  Skin:   Patient denies skin rash/ lesion and itching.  Eyes:   Patient denies blurred vision and double vision.  Ears/ Nose/ Throat:   Patient denies sore throat and sinus problems.  Hematologic/Lymphatic:   Patient denies swollen glands and easy bruising.  Cardiovascular:   Patient denies leg swelling and chest pains.  Respiratory:   Patient denies cough and shortness of breath.  Endocrine:   Patient denies excessive thirst.  Musculoskeletal:   Patient denies back pain and joint pain.  Neurological:   Patient denies headaches and dizziness.  Psychologic:   Patient denies depression and anxiety.   VITAL SIGNS:      02/28/2019 12:46 PM  Weight 170 lb / 77.11 kg  Height 71 in / 180.34 cm  BP 160/72 mmHg  Pulse 70 /min  BMI 23.7 kg/m   MULTI-SYSTEM PHYSICAL EXAMINATION:    Constitutional: Well-nourished. No physical deformities. Normally developed. Good grooming.  Neck: Neck symmetrical, not swollen. Normal tracheal position.  Respiratory: No labored breathing, no use of accessory muscles.   Cardiovascular: Normal temperature, normal extremity pulses, no swelling, no varicosities.  Skin: No paleness, no jaundice, no cyanosis. No lesion, no ulcer, no rash.  Neurologic / Psychiatric: Oriented to time, oriented to place, oriented to person. No depression, no anxiety, no agitation.  Gastrointestinal: No mass, no tenderness, no rigidity, non obese abdomen.  Eyes: Normal conjunctivae. Normal eyelids.  Ears, Nose, Mouth, and Throat: Left ear no scars, no lesions, no masses. Right ear no scars, no lesions, no masses. Nose no scars, no lesions, no masses. Normal hearing. Normal lips.  Musculoskeletal: Normal gait and station of head and neck. Mild tenderness to palpation left flank     PAST DATA REVIEWED:  Source Of History:  Patient  Records Review:   POC Tool  Urine  Test Review:   Urinalysis  X-Ray Review: KUB: Reviewed Films. Discussed With Patient. Difficult to see patients calculus on KUB C.T. Abdomen/Pelvis: Reviewed Films. Discussed With Patient. I reviewed CT scan which showed a left proximal 4 mm in width by 7 mm longitudinally left ureteral calculus    08/18/06  PSA  Total PSA 0.85    Notes:                     12/15/2017: PSA 1.5, BUN 17   PROCEDURES:         C.T. ABD-Pelv w/o - [id number]      Patient confirmed No Neulasta OnPro Device.            KUB - [id number]  A single view of the abdomen is obtained.      Patient confirmed No Neulasta OnPro Device.            Urinalysis w/Scope Dipstick Dipstick Cont'd Micro  Color: Red Bilirubin: Neg mg/dL WBC/hpf: 6 - 10/hpf  Appearance: Cloudy Ketones: Neg mg/dL RBC/hpf: >60/hpf  Specific Gravity: 1.025 Blood: 3+ ery/uL Bacteria: Few (10-25/hpf)  pH: 5.5 Protein: 1+ mg/dL Cystals: NS (Not  Seen)  Glucose: Neg mg/dL Urobilinogen: 0.2 mg/dL Casts: NS (Not Seen)    Nitrites: Neg Trichomonas: Not Present    Leukocyte Esterase: 2+ leu/uL Mucous: Not Present      Epithelial Cells: 0 - 5/hpf      Yeast: NS (Not Seen)      Sperm: Not Present    Notes: MICROSCOPIC NOT CONCENTRATED.    ASSESSMENT:      ICD-10 Details  1 GU:   Gross hematuria - R31.0 Stable  2   Renal and ureteral calculus - N20.2 Had a lengthy discussion with the patient about the findings on the CT scan and management options including medical expulsive therapy, ureteroscopy, and ESWL. Over the course of the afternoon the patient states that he is unable to proceed with medical expulsive therapy secondary to pain and would like a stent. The risks and benefits of the stent were discussed including pain, bleeding, infection, need for staged procedure, damage to surrounding structures, inability remove the stone. He understands these risks and has elected to proceed.   PLAN:    Cystoscopy with left ureteral stent placement today

## 2019-03-01 ENCOUNTER — Other Ambulatory Visit: Payer: Self-pay | Admitting: Urology

## 2019-03-01 ENCOUNTER — Encounter (HOSPITAL_COMMUNITY): Payer: Self-pay | Admitting: Urology

## 2019-03-01 NOTE — Anesthesia Postprocedure Evaluation (Signed)
Anesthesia Post Note  Patient: Luis [name]  Procedure(s) Performed: CYSTOSCOPY WITH RETROGRADE PYELOGRAM (Left )     Patient location during evaluation: PACU Anesthesia Type: General Level of consciousness: sedated Pain management: pain level controlled Vital Signs Assessment: post-procedure vital signs reviewed and stable Respiratory status: spontaneous breathing and respiratory function stable Cardiovascular status: stable Postop Assessment: no apparent nausea or vomiting Anesthetic complications: no    Last Vitals:  Vitals:   02/28/19 1845 02/28/19 1900  BP: 134/75   Pulse: 70 63  Resp: 13 12  Temp:    SpO2: 100% 98%    Last Pain:  Vitals:   02/28/19 1900  TempSrc:   PainSc: 0-No pain                 Markise Haymer DANIEL

## 2019-03-02 ENCOUNTER — Other Ambulatory Visit: Payer: Self-pay

## 2019-03-02 ENCOUNTER — Encounter (HOSPITAL_BASED_OUTPATIENT_CLINIC_OR_DEPARTMENT_OTHER): Payer: Self-pay | Admitting: *Deleted

## 2019-03-02 NOTE — Progress Notes (Signed)
Spoke w/ via phone for pre-op interview---Luis [name] needs dos----   I stat 8           [name] results------lov dr [name] ecp md 12-24-18 chart/epic, loop recorder check 02-28-19 chart/epicelg 12-24-18 chart/epic COVID test ------03-04-2019 Arrive at -------715 am 03-08-2019 NPO after ------midnight Medications to take morning of surgery -----metorpolol, tartrtae, omeprazole, oxybutynin Diabetic medication -----n/a Patient Special Instructions ----- Pre-Op special Istructions ----- Patient verbalized understanding of instructions that were given at this phone interview. Patient denies shortness of breath, chest pain, fever, cough a this phone interview.

## 2019-03-04 ENCOUNTER — Other Ambulatory Visit (HOSPITAL_COMMUNITY)
Admission: RE | Admit: 2019-03-04 | Discharge: 2019-03-04 | Disposition: A | Discharge status: Home / Self Care | Payer: PPO | Source: Ambulatory Visit | Attending: Urology | Admitting: Urology

## 2019-03-04 DIAGNOSIS — Z01812 Encounter for preprocedural laboratory examination: Secondary | ICD-10-CM | POA: Diagnosis not present

## 2019-03-04 DIAGNOSIS — Z20828 Contact with and (suspected) exposure to other viral communicable diseases: Secondary | ICD-10-CM | POA: Diagnosis not present

## 2019-03-04 LAB — SARS CORONAVIRUS 2 (TAT 6-24 HRS): SARS Coronavirus 2: NEGATIVE

## 2019-03-08 ENCOUNTER — Encounter (HOSPITAL_BASED_OUTPATIENT_CLINIC_OR_DEPARTMENT_OTHER)
Admission: RE | Disposition: A | Discharge status: Home / Self Care | Payer: Self-pay | Source: Home / Self Care | Attending: Urology

## 2019-03-08 ENCOUNTER — Ambulatory Visit (HOSPITAL_BASED_OUTPATIENT_CLINIC_OR_DEPARTMENT_OTHER): Payer: PPO | Admitting: Anesthesiology

## 2019-03-08 ENCOUNTER — Encounter (HOSPITAL_BASED_OUTPATIENT_CLINIC_OR_DEPARTMENT_OTHER): Payer: Self-pay

## 2019-03-08 ENCOUNTER — Ambulatory Visit (HOSPITAL_BASED_OUTPATIENT_CLINIC_OR_DEPARTMENT_OTHER)
Admission: RE | Admit: 2019-03-08 | Discharge: 2019-03-08 | Disposition: A | Discharge status: Home / Self Care | Payer: PPO | Attending: Urology | Admitting: Urology

## 2019-03-08 DIAGNOSIS — N201 Calculus of ureter: Secondary | ICD-10-CM | POA: Insufficient documentation

## 2019-03-08 DIAGNOSIS — Z79899 Other long term (current) drug therapy: Secondary | ICD-10-CM | POA: Insufficient documentation

## 2019-03-08 DIAGNOSIS — I480 Paroxysmal atrial fibrillation: Secondary | ICD-10-CM | POA: Insufficient documentation

## 2019-03-08 DIAGNOSIS — E785 Hyperlipidemia, unspecified: Secondary | ICD-10-CM | POA: Insufficient documentation

## 2019-03-08 DIAGNOSIS — I10 Essential (primary) hypertension: Secondary | ICD-10-CM | POA: Insufficient documentation

## 2019-03-08 DIAGNOSIS — R310 Gross hematuria: Secondary | ICD-10-CM | POA: Diagnosis present

## 2019-03-08 DIAGNOSIS — K219 Gastro-esophageal reflux disease without esophagitis: Secondary | ICD-10-CM | POA: Diagnosis not present

## 2019-03-08 DIAGNOSIS — Z950 Presence of cardiac pacemaker: Secondary | ICD-10-CM | POA: Insufficient documentation

## 2019-03-08 DIAGNOSIS — Z87891 Personal history of nicotine dependence: Secondary | ICD-10-CM | POA: Insufficient documentation

## 2019-03-08 DIAGNOSIS — Z7901 Long term (current) use of anticoagulants: Secondary | ICD-10-CM | POA: Diagnosis not present

## 2019-03-08 DIAGNOSIS — Z87442 Personal history of urinary calculi: Secondary | ICD-10-CM | POA: Diagnosis not present

## 2019-03-08 HISTORY — PX: CYSTOSCOPY/URETEROSCOPY/HOLMIUM LASER/STENT PLACEMENT: SHX6546

## 2019-03-08 LAB — POCT I-STAT, CHEM 8
BUN: 16 mg/dL (ref 8–23)
Calcium, Ion: 1.35 mmol/L (ref 1.15–1.40)
Chloride: 105 mmol/L (ref 98–111)
Creatinine, Ser: 1.1 mg/dL (ref 0.61–1.24)
Glucose, Bld: 111 mg/dL — ABNORMAL HIGH (ref 70–99)
HCT: 46 % (ref 39.0–52.0)
Hemoglobin: 15.6 g/dL (ref 13.0–17.0)
Potassium: 4.1 mmol/L (ref 3.5–5.1)
Sodium: 142 mmol/L (ref 135–145)
TCO2: 27 mmol/L (ref 22–32)

## 2019-03-08 SURGERY — CYSTOSCOPY/URETEROSCOPY/HOLMIUM LASER/STENT PLACEMENT
Anesthesia: General | Site: Ureter | Laterality: Left

## 2019-03-08 MED ORDER — LIDOCAINE 2% (20 MG/ML) 5 ML SYRINGE
INTRAMUSCULAR | Status: DC | PRN
  Administered 2019-03-08: 80 mg via INTRAVENOUS

## 2019-03-08 MED ORDER — EPHEDRINE SULFATE 50 MG/ML IJ SOLN
INTRAMUSCULAR | Status: DC | PRN
  Administered 2019-03-08: 5 mg via INTRAVENOUS

## 2019-03-08 MED ORDER — CEFAZOLIN SODIUM 1 G IJ SOLR
INTRAMUSCULAR | Status: AC
  Filled 2019-03-08: qty 10

## 2019-03-08 MED ORDER — CIPROFLOXACIN HCL 500 MG PO TABS: 500.0000 mg | ORAL_TABLET | Freq: Every day | ORAL | 0 refills | Status: AC

## 2019-03-08 MED ORDER — EPHEDRINE 5 MG/ML INJ
INTRAVENOUS | Status: AC
  Filled 2019-03-08: qty 10

## 2019-03-08 MED ORDER — LACTATED RINGERS IV SOLN
INTRAVENOUS | Status: DC
  Administered 2019-03-08 (×2): via INTRAVENOUS
  Filled 2019-03-08: qty 1000

## 2019-03-08 MED ORDER — ACETAMINOPHEN 500 MG PO TABS
ORAL_TABLET | ORAL | Status: AC
  Filled 2019-03-08: qty 2

## 2019-03-08 MED ORDER — CEFAZOLIN SODIUM-DEXTROSE 2-3 GM-%(50ML) IV SOLR
INTRAVENOUS | Status: DC | PRN
  Administered 2019-03-08: 2 g via INTRAVENOUS

## 2019-03-08 MED ORDER — FENTANYL CITRATE (PF) 100 MCG/2ML IJ SOLN
INTRAMUSCULAR | Status: DC | PRN
  Administered 2019-03-08 (×2): 25 ug via INTRAVENOUS
  Administered 2019-03-08: 50 ug via INTRAVENOUS

## 2019-03-08 MED ORDER — ACETAMINOPHEN 500 MG PO TABS
1000.0000 mg | ORAL_TABLET | Freq: Once | ORAL | Status: AC
  Administered 2019-03-08: 1000 mg via ORAL
  Filled 2019-03-08: qty 2

## 2019-03-08 MED ORDER — PROPOFOL 10 MG/ML IV BOLUS
INTRAVENOUS | Status: DC | PRN
  Administered 2019-03-08: 130 mg via INTRAVENOUS

## 2019-03-08 MED ORDER — ONDANSETRON HCL 4 MG/2ML IJ SOLN
INTRAMUSCULAR | Status: AC
  Filled 2019-03-08: qty 2

## 2019-03-08 MED ORDER — KETOROLAC TROMETHAMINE 30 MG/ML IJ SOLN
INTRAMUSCULAR | Status: AC
  Filled 2019-03-08: qty 1

## 2019-03-08 MED ORDER — DEXAMETHASONE SODIUM PHOSPHATE 10 MG/ML IJ SOLN
INTRAMUSCULAR | Status: DC | PRN
  Administered 2019-03-08: 5 mg via INTRAVENOUS

## 2019-03-08 MED ORDER — CEFAZOLIN SODIUM-DEXTROSE 2-4 GM/100ML-% IV SOLN
INTRAVENOUS | Status: AC
  Filled 2019-03-08: qty 100

## 2019-03-08 MED ORDER — DEXAMETHASONE SODIUM PHOSPHATE 10 MG/ML IJ SOLN
INTRAMUSCULAR | Status: AC
  Filled 2019-03-08: qty 1

## 2019-03-08 MED ORDER — FENTANYL CITRATE (PF) 100 MCG/2ML IJ SOLN
INTRAMUSCULAR | Status: AC
  Filled 2019-03-08: qty 2

## 2019-03-08 MED ORDER — KETOROLAC TROMETHAMINE 30 MG/ML IJ SOLN
INTRAMUSCULAR | Status: DC | PRN
  Administered 2019-03-08: 30 mg via INTRAVENOUS

## 2019-03-08 MED ORDER — PROPOFOL 10 MG/ML IV BOLUS
INTRAVENOUS | Status: AC
  Filled 2019-03-08: qty 20

## 2019-03-08 MED ORDER — SODIUM CHLORIDE 0.9 % IR SOLN
Status: DC | PRN
  Administered 2019-03-08: 3000 mL

## 2019-03-08 MED ORDER — LIDOCAINE 2% (20 MG/ML) 5 ML SYRINGE
INTRAMUSCULAR | Status: AC
  Filled 2019-03-08: qty 5

## 2019-03-08 MED ORDER — ONDANSETRON HCL 4 MG/2ML IJ SOLN
INTRAMUSCULAR | Status: DC | PRN
  Administered 2019-03-08: 4 mg via INTRAVENOUS

## 2019-03-08 SURGICAL SUPPLY — 23 items
BAG DRAIN URO-CYSTO SKYTR STRL (DRAIN) ×3 IMPLANT
BAG DRN UROCATH (DRAIN) ×1
BASKET ZERO TIP NITINOL 2.4FR (BASKET) ×2 IMPLANT
BSKT STON RTRVL ZERO TP 2.4FR (BASKET) ×1
CATH URET 5FR 28IN OPEN ENDED (CATHETERS) ×3 IMPLANT
CLOTH BEACON ORANGE TIMEOUT ST (SAFETY) ×3 IMPLANT
DRSG TEGADERM 4X4.75 (GAUZE/BANDAGES/DRESSINGS) ×3 IMPLANT
EXTRACTOR STONE 1.7FRX115CM (UROLOGICAL SUPPLIES) IMPLANT
FIBER LASER FLEXIVA 200 (UROLOGICAL SUPPLIES) IMPLANT
GLOVE BIO SURGEON STRL SZ 6.5 (GLOVE) ×2 IMPLANT
GLOVE BIO SURGEONS STRL SZ 6.5 (GLOVE) ×1
GOWN STRL REUS W/TWL LRG LVL3 (GOWN DISPOSABLE) ×3 IMPLANT
GUIDEWIRE ANG ZIPWIRE 038X150 (WIRE) IMPLANT
GUIDEWIRE STR DUAL SENSOR (WIRE) ×3 IMPLANT
KIT TURNOVER CYSTO (KITS) IMPLANT
MANIFOLD NEPTUNE II (INSTRUMENTS) ×3 IMPLANT
PACK CYSTO (CUSTOM PROCEDURE TRAY) ×3 IMPLANT
SHEATH URETERAL 12FRX28CM (UROLOGICAL SUPPLIES) IMPLANT
SHEATH URETERAL 12FRX35CM (MISCELLANEOUS) ×2 IMPLANT
STENT URET 6FRX26 CONTOUR (STENTS) ×2 IMPLANT
TUBE CONNECTING 12'X1/4 (SUCTIONS) ×1
TUBE CONNECTING 12X1/4 (SUCTIONS) ×2 IMPLANT
TUBING UROLOGY SET (TUBING) ×3 IMPLANT

## 2019-03-08 NOTE — Discharge Instructions (Signed)
Post Anesthesia Home Care Instructions  Activity: Get plenty of rest for the remainder of the day. A responsible adult should stay with you for 24 hours following the procedure.  For the next 24 hours, DO NOT: -Drive a car -Paediatric nurse -Drink alcoholic beverages -Take any medication unless instructed by your physician -Make any legal decisions or sign important papers.  Meals: Start with liquid foods such as gelatin or soup. Progress to regular foods as tolerated. Avoid greasy, spicy, heavy foods. If nausea and/or vomiting occur, drink only clear liquids until the nausea and/or vomiting subsides. Call your physician if vomiting continues.  Special Instructions/Symptoms: Your throat may feel dry or sore from the anesthesia or the breathing tube placed in your throat during surgery. If this causes discomfort, gargle with warm salt water. The discomfort should disappear within 24 hours.  If you had a scopolamine patch placed behind your ear for the management of post- operative nausea and/or vomiting:  1. The medication in the patch is effective for 72 hours, after which it should be removed.  Wrap patch in a tissue and discard in the trash. Wash hands thoroughly with soap and water. 2. You may remove the patch earlier than 72 hours if you experience unpleasant side effects which may include dry mouth, dizziness or visual disturbances. 3. Avoid touching the patch. Wash your hands with soap and water after contact with the patch.   Post stone removal/stent placement surgery instructions   Definitions:  Ureter: The duct that transports urine from the kidney to the bladder. Stent: A plastic hollow tube that is placed into the ureter, from the kidney to the bladder to prevent the ureter from swelling shut.  General instructions:  Despite the fact that no skin incisions were used, the area around the ureter and bladder is raw and irritated. The stent is a foreign body which will  further irritate the bladder wall. This irritation is manifested by increased frequency of urination, both day and night, and by an increase in the urge to urinate. In some, the urge to urinate is present almost always. Sometimes the urge is strong enough that you may not be able to stop your self from urinating. The only real cure is to remove the stent and then give time for the bladder wall to heal which can't be done until the danger of the ureter swelling shut has passed. (This varies from 2-21 days).  You may see some blood in your urine while the stent is in place and a few days afterward. Do not be alarmed, even if the urine is clear for a while. Get off your feet and drink lots of fluids until clearing occurs. If you start to pass clots or don't improve, call us.  If you have a string coming from your urethra:  The stent string is attached to your ureteral stent.  Do not pull on thisIf you have a string coming from your urethra:  The stent string is attached to your ureteral stent.  Do not pull on this.  Diet:  You may return to your normal diet immediately. Because of the raw surface of your bladder, alcohol, spicy foods, foods high in acid and drinks with caffeine may cause irritation or frequency and should be used in moderation. To keep your urine flowing freely and avoid constipation, drink plenty of fluids during the day (8-10 glasses). Tip: Avoid cranberry juice because it is very acidic.  Activity:  Your physical activity doesn't need to be  restricted. However, if you are very active, you may see some blood in the urine. We suggest that you reduce your activity under the circumstances until the bleeding has stopped.  Bowels:  It is important to keep your bowels regular during the postoperative period. Straining with bowel movements can cause bleeding. A bowel movement every other day is reasonable. Use a mild laxative if needed, such as milk of magnesia 2-3 tablespoons, or 2 Dulcolax  tablets. Call if you continue to have problems. If you had been taking narcotics for pain, before, during or after your surgery, you may be constipated. Take a laxative if necessary.     Medication:  You should resume your pre-surgery medications unless told not to. DO NOT RESUME YOUR ASPIRIN, or any other medicines like ibuprofen, motrin, excedrin, advil, aleve, vitamin E, fish oil as these can all cause bleeding x 7 days. In addition you may be given an antibiotic to prevent or treat infection. Antibiotics are not always necessary. All medication should be taken as prescribed until the bottles are finished unless you are having an unusual reaction to one of the drugs.  Problems you should report to [place]:  a. Fever greater than 101F. b. Heavy bleeding, or clots (see notes above about blood in urine). c. Inability to urinate. d. Drug reactions (hives, rash, nausea, vomiting, diarrhea). e. Severe burning or pain with urination that is not improving.  Followup:  You will need a followup appointment to monitor your progress in most cases. Please call the office for this appointment when you get home if your appointment has not already been scheduled. Usually the first appointment will be about 5-14 days after your surgery and if you have a stent in place it will likely be removed at that time.  -you can remove your stent on Monday, 03/14/19 -remove the bandage from your penis and pull string until stent (blue tube) has been completely removed

## 2019-03-08 NOTE — Interval H&P Note (Signed)
History and Physical Interval Note:  03/08/2019 8:55 AM   Patient is status post a left ureteral stent placed 02/28/2019. He is experienced gross hematuria with clots on and off but has remained on Xarelto. We discussed risks and benefits of left ureteroscopy, laser lithotripsy and stent exchange including pain, bleeding, stent discomfort, damage to surrounding structures including bladder/ureter/kidney, need for future intervention and inability to remove stone. Informed consent was obtained.  Luis [name]  has presented today for surgery, with the diagnosis of LEFT KIDNEY STONE.  The various methods of treatment have been discussed with the patient and family. After consideration of risks, benefits and other options for treatment, the patient has consented to  Procedure(s) with comments: CYSTOSCOPY/URETEROSCOPY/HOLMIUM LASER/STENT PLACEMENT (Left) - 1 HR as a surgical intervention.  The patient's history has been reviewed, patient examined, no change in status, stable for surgery.  I have reviewed the patient's chart and labs.  Questions were answered to the patient's satisfaction.     [name] D Linzee Depaul

## 2019-03-08 NOTE — Transfer of Care (Signed)
Immediate Anesthesia Transfer of Care Note  Patient: Luis [name]  Procedure(s) Performed: Procedure(s) (LRB): CYSTOSCOPY/URETEROSCOPY/HOLMIUM LASER/STENT PLACEMENT (Left)  Patient Location: PACU  Anesthesia Type: General  Level of Consciousness: awake, oriented, sedated and patient cooperative  Airway & Oxygen Therapy: Patient Spontanous Breathing and Patient connected to face mask oxygen  Post-op Assessment: Report given to PACU RN and Post -op Vital signs reviewed and stable  Post vital signs: Reviewed and stable  Complications: No apparent anesthesia complications Last Vitals:  Vitals Value Taken Time  BP    Temp    Pulse 77 03/08/19 1010  Resp 17 03/08/19 1010  SpO2 100 % 03/08/19 1010  Vitals shown include unvalidated device data.  Last Pain:  Vitals:   03/08/19 0739  TempSrc: Oral  PainSc: 0-No pain      Patients Stated Pain Goal: 8 (03/08/19 0739)

## 2019-03-08 NOTE — Anesthesia Postprocedure Evaluation (Signed)
Anesthesia Post Note  Patient: Luis [name]  Procedure(s) Performed: CYSTOSCOPY/URETEROSCOPY/HOLMIUM LASER/STENT PLACEMENT (Left Ureter)     Patient location during evaluation: PACU Anesthesia Type: General Level of consciousness: awake and alert, oriented and patient cooperative Pain management: pain level controlled Vital Signs Assessment: post-procedure vital signs reviewed and stable Respiratory status: spontaneous breathing, nonlabored ventilation and respiratory function stable Cardiovascular status: blood pressure returned to baseline and stable Postop Assessment: no apparent nausea or vomiting Anesthetic complications: no    Last Vitals:  Vitals:   03/08/19 0739 03/08/19 1015  BP: 129/76 (!) 142/81  Pulse: 64 79  Resp: 16 15  Temp: 36.4 C   SpO2: 100% 100%    Last Pain:  Vitals:   03/08/19 1010  TempSrc:   PainSc: 0-No pain                 Pervis Hocking

## 2019-03-08 NOTE — Op Note (Signed)
PATIENT:  Luis [name]  PRE-OPERATIVE DIAGNOSIS:  left Ureteral calculus  POST-OPERATIVE DIAGNOSIS: Same  PROCEDURE:  1. Cystoscopy, left ureteroscopy, basket stone extraction, and stent exchange  SURGEON: Jacalyn Lefevre, MD  INDICATION: let ureteral calculus  ANESTHESIA:  General  EBL:  Minimal  DRAINS: 6Fr x 26JJ ureteral stent  SPECIMEN:  Ureteral calculus  DESCRIPTION OF PROCEDURE: The patient was taken to the major OR and placed on the table. General anesthesia was administered and then the patient was moved to the dorsal lithotomy position. The genitalia was sterilely prepped and draped. An official timeout was performed.  Initially the 37 French cystoscope with 30 lens was passed under direct vision into the bladder. The bladder was then fully inspected. It was noted be free of any tumors, stones or inflammatory lesions. Ureteral orifices were of normal configuration and position.  The previous stent was seen emanating from the left ureteral orifice and was grasped with a grasper and brought to the urethral meatus.  A sensor wire was then advanced through the ureteral stent up to the kidney and position was confirmed with fluoroscopy.  Semirigid ureteroscopy then took place up to the renal pelvis.  No stone was seen so a second wire was advanced to the ureteroscope and ureteroscope was removed.  Ureteral access sheath was then placed over the working wire and the inner sheath and wire removed.  Flexible ureteroscopy then took place and revealed 6 mm calculus in the lower pole.  The stone was basket extracted.  Examination of the rest of the renal pelvis revealed no other stones.  The ureteral access sheath was removed and using the ureteroscope examining the ureter on the way out.  A 6 x 26 cm JJ stent was then placed over the wire with fluoroscopic guidance.  The patient's bladder was decompressed and the cystoscope was then removed. The patient tolerated the procedure well no  intraoperative complications.  PLAN OF CARE: Discharge to home after PACU  PATIENT DISPOSITION:  PACU - hemodynamically stable.

## 2019-03-08 NOTE — Anesthesia Preprocedure Evaluation (Addendum)
Anesthesia Evaluation  Patient identified by MRN, date of birth, ID band Patient awake  General Assessment Comment:Patient reports anesthesiologist telling him postop that he aspirated during his Afib ablation. No prolonged ventilation or postop ICU admission. Was not taking any PPIs at the time for his GERD  Reviewed: Allergy & Precautions, NPO status , Patient's Chart, lab work & pertinent test results  History of Anesthesia Complications (+) history of anesthetic complications  Airway Mallampati: II  TM Distance: >3 FB Neck ROM: Full    Dental  (+) Partial Upper, Dental Advisory Given, Teeth Intact   Pulmonary former smoker,    Pulmonary exam normal        Cardiovascular hypertension, Pt. on medications and Pt. on home beta blockers Normal cardiovascular exam+ dysrhythmias Atrial Fibrillation + pacemaker   PAF on xarelto  Last echo 2014: - Left ventricle: The cavity size was normal. Wall thickness  was normal. Systolic function was normal. The estimated  ejection fraction was in the range of 55% to 60%.  - Aortic valve: Structurally normal valve. Trileaflet. Cusp  separation was normal.  - Aorta: The aorta was normal, not dilated, and  non-diseased.  - Mitral valve: Structurally normal valve. Trivial  regurgitation.  - Left atrium: The atrium was dilated. No evidence of  thrombus in the atrial cavity or appendage.  - Right ventricle: The cavity size was normal. Wall  thickness was normal. Systolic function was normal.  - Right atrium: No evidence of thrombus in the atrial cavity  or appendage.  - Atrial septum: No defect or patent foramen ovale was  identified. Echo contrast study showed no right-to-left  atrial level shunt, following an increase in RA pressure  induced by provocative maneuvers.  - Tricuspid valve: Structurally normal valve. Trivial  regurgitation.  - Pulmonic valve: Structurally  normal valve. No  regurgitation.    Neuro/Psych negative neurological ROS     GI/Hepatic Neg liver ROS, GERD  Medicated and Controlled,Hx anal fissures  Diverticular disease  Esophageal stricture   Endo/Other  negative endocrine ROS  Renal/GU    BPH    Musculoskeletal  (+) Arthritis , Osteoarthritis,    Abdominal Normal abdominal exam  (+)   Peds  Hematology negative hematology ROS (+)   Anesthesia Other Findings HLD  Reproductive/Obstetrics                          Anesthesia Physical  Anesthesia Plan  ASA: II  Anesthesia Plan: General   Post-op Pain Management:    Induction: Intravenous  PONV Risk Score and Plan: 2 and Ondansetron and Dexamethasone  Airway Management Planned: LMA  Additional Equipment: None  Intra-op Plan:   Post-operative Plan: Extubation in OR  Informed Consent: I have reviewed the patients History and Physical, chart, labs and discussed the procedure including the risks, benefits and alternatives for the proposed anesthesia with the patient or authorized representative who has indicated his/her understanding and acceptance.     Dental advisory given  Plan Discussed with: CRNA  Anesthesia Plan Comments:      Anesthesia Quick Evaluation

## 2019-03-08 NOTE — Anesthesia Procedure Notes (Signed)
Procedure Name: LMA Insertion Date/Time: 03/08/2019 9:20 AM Performed by: Suan Halter, CRNA Pre-anesthesia Checklist: Patient identified, Emergency Drugs available, Suction available and Patient being monitored Patient Re-evaluated:Patient Re-evaluated prior to induction Oxygen Delivery Method: Circle system utilized Preoxygenation: Pre-oxygenation with 100% oxygen Induction Type: IV induction Ventilation: Mask ventilation without difficulty LMA: LMA inserted LMA Size: 4.0 Number of attempts: 1 Airway Equipment and Method: Bite block Placement Confirmation: positive ETCO2 Tube secured with: Tape Dental Injury: Teeth and Oropharynx as per pre-operative assessment

## 2019-03-09 ENCOUNTER — Encounter (HOSPITAL_BASED_OUTPATIENT_CLINIC_OR_DEPARTMENT_OTHER): Payer: Self-pay | Admitting: Urology

## 2019-03-10 DIAGNOSIS — L570 Actinic keratosis: Secondary | ICD-10-CM | POA: Diagnosis not present

## 2019-03-10 DIAGNOSIS — L821 Other seborrheic keratosis: Secondary | ICD-10-CM | POA: Diagnosis not present

## 2019-03-10 DIAGNOSIS — Z85828 Personal history of other malignant neoplasm of skin: Secondary | ICD-10-CM | POA: Diagnosis not present

## 2019-03-10 DIAGNOSIS — D692 Other nonthrombocytopenic purpura: Secondary | ICD-10-CM | POA: Diagnosis not present

## 2019-03-23 ENCOUNTER — Telehealth (INDEPENDENT_AMBULATORY_CARE_PROVIDER_SITE_OTHER): Payer: PPO | Admitting: Internal Medicine

## 2019-03-23 ENCOUNTER — Encounter: Payer: Self-pay | Admitting: Internal Medicine

## 2019-03-23 VITALS — Ht 71.0 in | Wt 169.0 lb

## 2019-03-23 DIAGNOSIS — I480 Paroxysmal atrial fibrillation: Secondary | ICD-10-CM

## 2019-03-23 DIAGNOSIS — I10 Essential (primary) hypertension: Secondary | ICD-10-CM

## 2019-03-23 DIAGNOSIS — D6869 Other thrombophilia: Secondary | ICD-10-CM

## 2019-03-23 NOTE — Addendum Note (Signed)
Addended by: Stanton Kidney on: 03/23/2019 10:21 AM   Modules accepted: Orders

## 2019-03-23 NOTE — Patient Instructions (Signed)
Medication Instructions:  Your physician has recommended you make the following change in your medication:  1. STOP Xarelto  * If you need a refill on your cardiac medications before your next appointment, please call your pharmacy.   Labwork: None ordered  Testing/Procedures: None ordered  Follow-Up: At [facility], you and your health needs are our priority.  As part of our continuing mission to provide you with exceptional heart care, we have created designated Provider Care Teams.  These Care Teams include your primary Cardiologist (physician) and Advanced Practice Providers (APPs -  Physician Assistants and Nurse Practitioners) who all work together to provide you with the care you need, when you need it.  You will need a follow up appointment in 1 year.  Please call our office 2 months in advance to schedule this appointment.  You may see Dr [name] or one of the following Advanced Practice Providers on your designated Care Team:    Chanetta Marshall, NP  Tommye Standard, PA-C  Oda Kilts, [place]   Thank you for choosing [facility]!!

## 2019-03-23 NOTE — Progress Notes (Signed)
Electrophysiology TeleHealth Note   Due to national recommendations of social distancing due to [place] 19, an audio telehealth visit is felt to be most appropriate for this patient at this time.  See MyChart message from today for the patient's consent to telehealth for [facility].   Date:  03/23/2019   ID:  Meilech, Luchsinger 11/01/1945, MRN [id number]  Location: patient's home  Provider location:  [facility]  Evaluation Performed: Follow-up visit  PCP:  Levin Erp, MD   Electrophysiologist:  Dr [name]  Chief Complaint:  palpitations  History of Present Illness:    Luis [name] is a 73 y.o. male who presents via telehealth conferencing today.  Since last being seen in our clinic, the patient reports doing very well.  No symptoms of afib.  Today, he denies symptoms of palpitations, chest pain, shortness of breath,  lower extremity edema, dizziness, presyncope, or syncope.  His primary concern is with kidney stones.  + hematuria and pain.  The patient is otherwise without complaint today.  The patient denies symptoms of fevers, chills, cough, or new SOB worrisome for COVID 19.  Past Medical History:  Diagnosis Date  . Allergy    SEASONAL  . Arthritis    BACK AND KNEES  . Complication of anesthesia    pt states aspirated following ablation   . Dysrhythmia   . GERD (gastroesophageal reflux disease)   . History of kidney stones 07/2006   x 1  . Hyperlipidemia   . Hypertension   . Normal coronary arteries 2008   Normal LV function  . Paroxysmal atrial fibrillation ([place])    2008 and 20014, PVI 09/07/12    Past Surgical History:  Procedure Laterality Date  . ATRIAL ABLATION SURGERY  09/07/12   PVI by Dr [name]  . ATRIAL FIBRILLATION ABLATION N/A 09/07/2012   Procedure: ATRIAL FIBRILLATION ABLATION;  Surgeon: Thompson Grayer, MD;  Location: [facility] CATH LAB;  Service: Cardiovascular;  Laterality: N/A;  . CARDIAC CATHETERIZATION Left 07/20/2006   No significant CAD, EF  60%, continue medical therapy  . COLONOSCOPY    . CYSTOSCOPY W/ RETROGRADES Left 02/28/2019   Procedure: CYSTOSCOPY WITH RETROGRADE PYELOGRAM;  Surgeon: Robley Fries, MD;  Location: [facility] OR[facility];  Service: Urology;  Laterality: Left;  . CYSTOSCOPY/URETEROSCOPY/HOLMIUM LASER/STENT PLACEMENT Left 03/08/2019   Procedure: CYSTOSCOPY/URETEROSCOPY/HOLMIUM LASER/STENT PLACEMENT;  Surgeon: Robley Fries, MD;  Location: [facility];  Service: Urology;  Laterality: Left;  1 HR  . EP IMPLANTABLE DEVICE N/A 01/23/2015   Procedure: Loop Recorder Insertion;  Surgeon: Thompson Grayer, MD;  Location: [place] CV LAB;  Service: Cardiovascular;  Laterality: N/A;  . implantable loop recorder placement  12/24/2018   previous ILR removed and a new MDT Reveal [id number] implanted in its place in office by Dr [name]  . KNEE ARTHROSCOPY Right   . TEE WITHOUT CARDIOVERSION N/A 09/06/2012   Procedure: TRANSESOPHAGEAL ECHOCARDIOGRAM (TEE);  Surgeon: Pixie Casino, MD;  Location: [facility] ENDOSCOPY;  Service: Cardiovascular;  Laterality: N/A;  . UPPER GASTROINTESTINAL ENDOSCOPY      Current Outpatient Medications  Medication Sig Dispense Refill  . diltiazem (CARDIZEM) 30 MG tablet Take 1 tablet (30 mg total) by mouth as needed. 15 tablet 3  . metoprolol tartrate (LOPRESSOR) 50 MG tablet Take 1 tablet by mouth twice daily 180 tablet 3  . omeprazole (PRILOSEC) 20 MG capsule Take 20 mg by mouth daily.    . rosuvastatin (CRESTOR) 10 MG tablet Take 1 tablet  by mouth once daily (Patient taking differently: 2 (two) times daily at 8 am and 4 pm. ) 90 tablet 2  . XARELTO 20 MG TABS tablet TAKE 1 TABLET BY MOUTH ONCE DAILY WITH SUPPER 90 tablet 0   No current facility-administered medications for this visit.    Allergies:   Patient has no known allergies.   Social History:  The patient  reports that he quit smoking about 24 years ago. His smoking use included cigarettes. He quit after 35.00 years of use. He has never  used smokeless tobacco. He reports current alcohol use of about 7.0 standard drinks of alcohol per week. He reports that he does not use drugs.   Family History:  The patient's family history includes CAD in his father; Heart Problems in his father; Heart Problems (age of onset: 54) in his brother; Hypertension in his father; Pancreatic cancer in his mother; Stroke in his father.   ROS:  Please see the history of present illness.   All other systems are personally reviewed and negative.    Exam:    Vital Signs:  Ht 5\' 11"  (1.803 m)   Wt 169 lb (76.7 kg)   BMI 23.57 kg/m   Well sounding, alert and conversant   Labs/Other Tests and Data Reviewed:    Recent Labs: 02/28/2019: Platelets 150 03/08/2019: BUN 16; Creatinine, Ser 1.10; Hemoglobin 15.6; Potassium 4.1; Sodium 142   Wt Readings from Last 3 Encounters:  03/23/19 169 lb (76.7 kg)  03/08/19 173 lb 1 oz (78.5 kg)  02/28/19 173 lb 6.4 oz (78.7 kg)     Last device remote is reviewed from [place] PDF which reveals no arrhythmias    ASSESSMENT & PLAN:    1.  Paroxysmal atrial fibrillation No afib by ILR Though his chads2vasc score is 3, he is having hematuria.  He has had no afib by ILR. Through a shared decision making process, he has decided to stop anticoagulation at this time.  We will stop xarelto at this time.  If he has more afib by ILR, he may be more willing to restart anticoagulation  2. HTN Stable No change required today  Follow-up:    Patient Risk:  after full review of this patients clinical status, I feel that they are at moderate risk at this time.  Today, I have spent 15 minutes with the patient with telehealth technology discussing arrhythmia management .    Army Fossa, MD  03/23/2019 9:30 AM     [facility] [address] [place] [address] [phone] (office) [phone] (fax)

## 2019-03-24 DIAGNOSIS — N202 Calculus of kidney with calculus of ureter: Secondary | ICD-10-CM | POA: Diagnosis not present

## 2019-03-27 ENCOUNTER — Other Ambulatory Visit: Payer: Self-pay | Admitting: Urology

## 2019-03-27 MED ORDER — SULFAMETHOXAZOLE-TRIMETHOPRIM 800-160 MG PO TABS: 1.0000 | ORAL_TABLET | Freq: Two times a day (BID) | ORAL | 0 refills | Status: DC

## 2019-03-28 DIAGNOSIS — R109 Unspecified abdominal pain: Secondary | ICD-10-CM | POA: Diagnosis not present

## 2019-03-28 DIAGNOSIS — N202 Calculus of kidney with calculus of ureter: Secondary | ICD-10-CM | POA: Diagnosis not present

## 2019-03-29 DIAGNOSIS — N202 Calculus of kidney with calculus of ureter: Secondary | ICD-10-CM | POA: Diagnosis not present

## 2019-03-31 ENCOUNTER — Ambulatory Visit (INDEPENDENT_AMBULATORY_CARE_PROVIDER_SITE_OTHER): Payer: PPO | Admitting: *Deleted

## 2019-03-31 DIAGNOSIS — R002 Palpitations: Secondary | ICD-10-CM

## 2019-03-31 LAB — CUP PACEART REMOTE DEVICE CHECK
Date Time Interrogation Session: 20201224084902
Implantable Pulse Generator Implant Date: 20200918

## 2019-04-03 NOTE — Progress Notes (Signed)
ILR remote 

## 2019-04-18 DIAGNOSIS — R10813 Right lower quadrant abdominal tenderness: Secondary | ICD-10-CM | POA: Diagnosis not present

## 2019-04-20 DIAGNOSIS — N451 Epididymitis: Secondary | ICD-10-CM | POA: Diagnosis not present

## 2019-04-22 ENCOUNTER — Other Ambulatory Visit: Payer: Self-pay | Admitting: Internal Medicine

## 2019-04-22 DIAGNOSIS — R1031 Right lower quadrant pain: Secondary | ICD-10-CM

## 2019-04-25 DIAGNOSIS — N202 Calculus of kidney with calculus of ureter: Secondary | ICD-10-CM | POA: Diagnosis not present

## 2019-04-25 DIAGNOSIS — N452 Orchitis: Secondary | ICD-10-CM | POA: Diagnosis not present

## 2019-05-02 ENCOUNTER — Ambulatory Visit (INDEPENDENT_AMBULATORY_CARE_PROVIDER_SITE_OTHER): Payer: PPO | Admitting: *Deleted

## 2019-05-02 DIAGNOSIS — R002 Palpitations: Secondary | ICD-10-CM | POA: Diagnosis not present

## 2019-05-02 LAB — CUP PACEART REMOTE DEVICE CHECK
Date Time Interrogation Session: 20210124230131
Implantable Pulse Generator Implant Date: 20200918

## 2019-05-20 DIAGNOSIS — M25562 Pain in left knee: Secondary | ICD-10-CM | POA: Diagnosis not present

## 2019-05-20 DIAGNOSIS — M170 Bilateral primary osteoarthritis of knee: Secondary | ICD-10-CM | POA: Diagnosis not present

## 2019-05-20 DIAGNOSIS — M1712 Unilateral primary osteoarthritis, left knee: Secondary | ICD-10-CM | POA: Diagnosis not present

## 2019-05-20 DIAGNOSIS — M25561 Pain in right knee: Secondary | ICD-10-CM | POA: Diagnosis not present

## 2019-05-20 DIAGNOSIS — M1711 Unilateral primary osteoarthritis, right knee: Secondary | ICD-10-CM | POA: Diagnosis not present

## 2019-06-02 ENCOUNTER — Ambulatory Visit (INDEPENDENT_AMBULATORY_CARE_PROVIDER_SITE_OTHER): Payer: PPO | Admitting: *Deleted

## 2019-06-02 DIAGNOSIS — R002 Palpitations: Secondary | ICD-10-CM | POA: Diagnosis not present

## 2019-06-02 LAB — CUP PACEART REMOTE DEVICE CHECK
Date Time Interrogation Session: 20210224230232
Implantable Pulse Generator Implant Date: 20200918

## 2019-06-03 NOTE — Progress Notes (Signed)
ILR Remote 

## 2019-06-06 ENCOUNTER — Encounter: Payer: Self-pay | Admitting: Cardiology

## 2019-06-06 ENCOUNTER — Other Ambulatory Visit: Payer: Self-pay | Admitting: Internal Medicine

## 2019-06-06 ENCOUNTER — Other Ambulatory Visit: Payer: Self-pay

## 2019-06-06 ENCOUNTER — Ambulatory Visit: Payer: PPO | Admitting: Cardiology

## 2019-06-06 VITALS — BP 138/72 | HR 64 | Temp 97.5°F | Ht 71.0 in | Wt 175.4 lb

## 2019-06-06 DIAGNOSIS — I25119 Atherosclerotic heart disease of native coronary artery with unspecified angina pectoris: Secondary | ICD-10-CM | POA: Diagnosis not present

## 2019-06-06 DIAGNOSIS — R55 Syncope and collapse: Secondary | ICD-10-CM

## 2019-06-06 DIAGNOSIS — Z7901 Long term (current) use of anticoagulants: Secondary | ICD-10-CM | POA: Diagnosis not present

## 2019-06-06 DIAGNOSIS — Z8249 Family history of ischemic heart disease and other diseases of the circulatory system: Secondary | ICD-10-CM | POA: Diagnosis not present

## 2019-06-06 DIAGNOSIS — R079 Chest pain, unspecified: Secondary | ICD-10-CM | POA: Diagnosis not present

## 2019-06-06 DIAGNOSIS — I10 Essential (primary) hypertension: Secondary | ICD-10-CM

## 2019-06-06 DIAGNOSIS — R002 Palpitations: Secondary | ICD-10-CM

## 2019-06-06 DIAGNOSIS — I480 Paroxysmal atrial fibrillation: Secondary | ICD-10-CM

## 2019-06-06 NOTE — Assessment & Plan Note (Signed)
RFA in 2014- no recurrence since 

## 2019-06-06 NOTE — Assessment & Plan Note (Signed)
40-50% Dx at cath in 2008-low risk Myoview in 2014

## 2019-06-06 NOTE — Assessment & Plan Note (Signed)
Controlled.  

## 2019-06-06 NOTE — Assessment & Plan Note (Signed)
Bieif spells- apparently not related to his heart rhythm (LINQ in place)

## 2019-06-06 NOTE — Patient Instructions (Addendum)
Medication Instructions:  No Changes *If you need a refill on your cardiac medications before your next appointment, please call your pharmacy*  Lab Work: Your physician recommends that you return for lab work today (CBC, BMP, PSA) If you have labs (blood work) drawn today and your tests are completely normal, you will receive your results only by: [name] MyChart Message (if you have MyChart) OR . A paper copy in the mail If you have any lab test that is abnormal or we need to change your treatment, we will call you to review the results.  Testing/Procedures: Your physician has requested that you have a lexiscan myoview. For further information please visit [email or web address]. Please follow instruction sheet, as given.  Follow-Up: At [facility], you and your health needs are our priority.  As part of our continuing mission to provide you with exceptional heart care, we have created designated Provider Care Teams.  These Care Teams include your primary Cardiologist (physician) and Advanced Practice Providers (APPs -  Physician Assistants and Nurse Practitioners) who all work together to provide you with the care you need, when you need it.  We recommend signing up for the patient portal called "MyChart".  Sign up information is provided on this After Visit Summary.  MyChart is used to connect with patients for Virtual Visits (Telemedicine).  Patients are able to view lab/test results, encounter notes, upcoming appointments, etc.  Non-urgent messages can be sent to your provider as well.   To learn more about what you can do with MyChart, go to [email or web address].    Your next appointment:   4 -6  week(s)  The format for your next appointment:   In Person  Provider:   Quay Burow, MD

## 2019-06-06 NOTE — Assessment & Plan Note (Signed)
Brother died at 62 of an MI 

## 2019-06-06 NOTE — Progress Notes (Signed)
Cardiology Office Note:    Date:  06/06/2019   ID:  Luis [name], Luis [name] 04/03/1946, MRN [id number]  PCP:  Levin Erp, MD  Cardiologist:  Dr Gwenlyn Found- Electrophysiologist:  Dr [name]  Referring MD: Levin Erp, MD   CC: Near syncope  History of Present Illness:    Luis [name] is a 74 y.o. male with a hx of coronary disease at catheterization in 2008 with a 40 to 50% diagonal narrowing.  Myoview in 2014 was low risk. He has a strong family history of coronary disease, his brother died suddenly at 40. He has a history of PAF.  He underwent radiofrequency ablation in 2014.  He had a Linq recorder implanted in 2016, this was replaced with a [facility] September 2020.  He is done well from atrial fibrillation standpoint, no significant atrial fibrillation apparently documented since 2014.  He did have some gross hematuria issues this past year associated with kidney stones.  After discussion with Dr. [name] in September 2020 it was decided stop anticoagulation.    He is in the office today with complaints of near syncopal spells.  He describes 2 episodes where he felt like he was suddenly going to blackout.  One was all he was cooking at the the stove, one was when he was walking with his wife.  They only last about 30 seconds.  Apparently with his new recorder device he can look at his phone and see if there is been any arrhythmia and he tells me there has not.  He denies any associated nausea vomiting or diaphoresis with these episodes.  He thinks he may have had some vague chest discomfort, is hard for him to tell.  He is concerned about progression of coronary disease as a possible etiology.  Past Medical History:  Diagnosis Date  . Allergy    SEASONAL  . Arthritis    BACK AND KNEES  . Complication of anesthesia    pt states aspirated following ablation   . Dysrhythmia   . GERD (gastroesophageal reflux disease)   . History of kidney stones 07/2006   x 1  . Hyperlipidemia   . Hypertension    . Normal coronary arteries 2008   Normal LV function  . Paroxysmal atrial fibrillation ([place])    2008 and 20014, PVI 09/07/12    Past Surgical History:  Procedure Laterality Date  . ATRIAL ABLATION SURGERY  09/07/12   PVI by Dr [name]  . ATRIAL FIBRILLATION ABLATION N/A 09/07/2012   Procedure: ATRIAL FIBRILLATION ABLATION;  Surgeon: Thompson Grayer, MD;  Location: [facility] CATH LAB;  Service: Cardiovascular;  Laterality: N/A;  . CARDIAC CATHETERIZATION Left 07/20/2006   No significant CAD, EF 60%, continue medical therapy  . COLONOSCOPY    . CYSTOSCOPY W/ RETROGRADES Left 02/28/2019   Procedure: CYSTOSCOPY WITH RETROGRADE PYELOGRAM;  Surgeon: Robley Fries, MD;  Location[facility];  Service: Urology;  Laterality: Left;  . CYSTOSCOPY/URETEROSCOPY/HOLMIUM LASER/STENT PLACEMENT Left 03/08/2019   Procedure: CYSTOSCOPY/URETEROSCOPY/HOLMIUM LASER/STENT PLACEMENT;  Surgeon: Robley Fries, MD;  Location: [facility];  Service: Urology;  Laterality: Left;  1 HR  . EP IMPLANTABLE DEVICE N/A 01/23/2015   Procedure: Loop Recorder Insertion;  Surgeon: Thompson Grayer, MD;  Location: [facility] CV LAB;  Service: Cardiovascular;  Laterality: N/A;  . implantable loop recorder placement  12/24/2018   previous ILR removed and a new MDT Reveal [id number] implanted in its place in office by Dr [name]  . KNEE ARTHROSCOPY Right   .  TEE WITHOUT CARDIOVERSION N/A 09/06/2012   Procedure: TRANSESOPHAGEAL ECHOCARDIOGRAM (TEE);  Surgeon: Pixie Casino, MD;  Location: [facility] ENDOSCOPY;  Service: Cardiovascular;  Laterality: N/A;  . UPPER GASTROINTESTINAL ENDOSCOPY      Current Medications: No outpatient medications have been marked as taking for the 06/06/19 encounter (Office Visit) with Erlene Quan, PA-C.     Allergies:   Patient has no known allergies.   Social History   Socioeconomic History  . Marital status: Married    Spouse name: Not on file  . Number of children: Not on file  . Years of education:  Not on file  . Highest education level: Not on file  Occupational History  . Not on file  Tobacco Use  . Smoking status: Former Smoker    Years: 35.00    Types: Cigarettes    Quit date: 11/27/1994    Years since quitting: 24.5  . Smokeless tobacco: Never Used  Substance and Sexual Activity  . Alcohol use: Yes    Alcohol/week: 7.0 standard drinks    Types: 7 Standard drinks or equivalent per week    Comment: 3-4 oz daily 1/2 drinks per night  . Drug use: No  . Sexual activity: Not on file  Other Topics Concern  . Not on file  Social History Narrative   Lives in [place] with spouse.   Retired Electrical engineer   Social Determinants of Radio broadcast assistant Strain:   . Difficulty of Paying Living Expenses: Not on file  Food Insecurity:   . Worried About Charity fundraiser in the Last Year: Not on file  . Ran Out of Food in the Last Year: Not on file  Transportation Needs:   . Lack of Transportation (Medical): Not on file  . Lack of Transportation (Non-Medical): Not on file  Physical Activity:   . Days of Exercise per Week: Not on file  . Minutes of Exercise per Session: Not on file  Stress:   . Feeling of Stress : Not on file  Social Connections:   . Frequency of Communication with Friends and Family: Not on file  . Frequency of Social Gatherings with Friends and Family: Not on file  . Attends Religious Services: Not on file  . Active Member of Clubs or Organizations: Not on file  . Attends Archivist Meetings: Not on file  . Marital Status: Not on file     Family History: The patient's family history includes CAD in his father; Heart Problems in his father; Heart Problems (age of onset: 16) in his brother; Hypertension in his father; Pancreatic cancer in his mother; Stroke in his father.  ROS:   Please see the history of present illness.     All other systems reviewed and are negative.  EKGs/Labs/Other Studies Reviewed:    The following  studies were reviewed today: Myoview 2014  EKG:  EKG is ordered today.  The ekg ordered today demonstrates NSR, 64  Recent Labs: 02/28/2019: Platelets 150 03/08/2019: BUN 16; Creatinine, Ser 1.10; Hemoglobin 15.6; Potassium 4.1; Sodium 142  Recent Lipid Panel    Component Value Date/Time   CHOL 205 (H) 07/24/2014 0810   TRIG 174 (H) 07/24/2014 0810   HDL 40 07/24/2014 0810   CHOLHDL 5.1 07/24/2014 0810   VLDL 35 07/24/2014 0810   LDLCALC 130 (H) 07/24/2014 0810    Physical Exam:    VS:  BP 138/72   Pulse 64   Temp (!) 97.5 F (36.4  C)   Ht 5\' 11"  (1.803 m)   Wt 175 lb 6.4 oz (79.6 kg)   SpO2 97%   BMI 24.46 kg/m     Wt Readings from Last 3 Encounters:  06/06/19 175 lb 6.4 oz (79.6 kg)  03/23/19 169 lb (76.7 kg)  03/08/19 173 lb 1 oz (78.5 kg)     GEN:  Well nourished, well developed in no acute distress HEENT: Normal NECK: No JVD; No carotid bruits CARDIAC: RRR, no murmurs, rubs, gallops RESPIRATORY:  Clear to auscultation without rales, wheezing or rhonchi  ABDOMEN: Soft, non-tender, non-distended MUSCULOSKELETAL:  No edema; No deformity  SKIN: Warm and dry NEUROLOGIC:  Alert and oriented x 3 PSYCHIATRIC:  Normal affect   ASSESSMENT:    Chronic anticoagulation Stopped Dec 2020 secondary to hematuria  Near syncope Bieif spells- apparently not related to his heart rhythm (LINQ in place)  CAD (coronary artery disease) 40-50% Dx at cath in 2008-low risk Myoview in 2014  Paroxysmal atrial fibrillation- RFA June 2014 RFA in 2014- no recurrence since  Essential hypertension Controlled  Family history of coronary artery disease in brother Brother died at 76 of an MI  PLAN:    [place], check baseline labs- CBC BMP- he also requested a PSA be drawn.  F/U with Dr Gwenlyn Found after this.    Medication Adjustments/Labs and Tests Ordered: Current medicines are reviewed at length with the patient today.  Concerns regarding medicines are outlined above.   Orders Placed This Encounter  Procedures  . Basic metabolic panel  . CBC  . PSA  . MYOCARDIAL PERFUSION IMAGING  . EKG 12-Lead   No orders of the defined types were placed in this encounter.   Patient Instructions  Medication Instructions:  No Changes *If you need a refill on your cardiac medications before your next appointment, please call your pharmacy*  Lab Work: Your physician recommends that you return for lab work today (CBC, BMP, PSA) If you have labs (blood work) drawn today and your tests are completely normal, you will receive your results only by: [name] MyChart Message (if you have MyChart) OR . A paper copy in the mail If you have any lab test that is abnormal or we need to change your treatment, we will call you to review the results.  Testing/Procedures: Your physician has requested that you have a lexiscan myoview. For further information please visit [email or web address]. Please follow instruction sheet, as given.  Follow-Up: At [facility], you and your health needs are our priority.  As part of our continuing mission to provide you with exceptional heart care, we have created designated Provider Care Teams.  These Care Teams include your primary Cardiologist (physician) and Advanced Practice Providers (APPs -  Physician Assistants and Nurse Practitioners) who all work together to provide you with the care you need, when you need it.  We recommend signing up for the patient portal called "MyChart".  Sign up information is provided on this After Visit Summary.  MyChart is used to connect with patients for Virtual Visits (Telemedicine).  Patients are able to view lab/test results, encounter notes, upcoming appointments, etc.  Non-urgent messages can be sent to your provider as well.   To learn more about what you can do with MyChart, go to [email or web address].    Your next appointment:   4 -6  week(s)  The format for your next appointment:   In  Person  Provider:   Quay Burow, MD     Signed, Lurena Joiner  Reino Bellis  06/06/2019 3:34 PM    [facility]

## 2019-06-06 NOTE — Assessment & Plan Note (Signed)
Stopped Dec 2020 secondary to hematuria 

## 2019-06-07 LAB — CBC
Hematocrit: 48.8 % (ref 37.5–51.0)
Hemoglobin: 16.6 g/dL (ref 13.0–17.7)
MCH: 32.1 pg (ref 26.6–33.0)
MCHC: 34 g/dL (ref 31.5–35.7)
MCV: 94 fL (ref 79–97)
Platelets: 169 10*3/uL (ref 150–450)
RBC: 5.17 x10E6/uL (ref 4.14–5.80)
RDW: 12.4 % (ref 11.6–15.4)
WBC: 6.6 10*3/uL (ref 3.4–10.8)

## 2019-06-07 LAB — BASIC METABOLIC PANEL
BUN/Creatinine Ratio: 14 (ref 10–24)
BUN: 20 mg/dL (ref 8–27)
CO2: 25 mmol/L (ref 20–29)
Calcium: 10.4 mg/dL — ABNORMAL HIGH (ref 8.6–10.2)
Chloride: 103 mmol/L (ref 96–106)
Creatinine, Ser: 1.41 mg/dL — ABNORMAL HIGH (ref 0.76–1.27)
GFR calc Af Amer: 56 mL/min/{1.73_m2} — ABNORMAL LOW (ref >59)
GFR calc non Af Amer: 48 mL/min/{1.73_m2} — ABNORMAL LOW (ref >59)
Glucose: 90 mg/dL (ref 65–99)
Potassium: 4.6 mmol/L (ref 3.5–5.2)
Sodium: 142 mmol/L (ref 134–144)

## 2019-06-07 LAB — PSA: Prostate Specific Ag, Serum: 2.6 ng/mL (ref 0.0–4.0)

## 2019-06-08 ENCOUNTER — Telehealth (HOSPITAL_COMMUNITY): Payer: Self-pay

## 2019-06-08 NOTE — Telephone Encounter (Signed)
Encounter complete. 

## 2019-06-10 ENCOUNTER — Ambulatory Visit (HOSPITAL_COMMUNITY)
Admission: RE | Admit: 2019-06-10 | Payer: PPO | Source: Ambulatory Visit | Attending: Cardiology | Admitting: Cardiology

## 2019-06-24 ENCOUNTER — Telehealth (HOSPITAL_COMMUNITY): Payer: Self-pay | Admitting: Cardiology

## 2019-06-24 NOTE — Telephone Encounter (Signed)
I called patient to schedule Lexiscan and he has decided to wait to have it and will see Dr. Gwenlyn Found here in a few weeks and will discuss and if he still wants him to have we can put in new order.

## 2019-07-04 ENCOUNTER — Ambulatory Visit (INDEPENDENT_AMBULATORY_CARE_PROVIDER_SITE_OTHER): Payer: PPO | Admitting: *Deleted

## 2019-07-04 DIAGNOSIS — L821 Other seborrheic keratosis: Secondary | ICD-10-CM | POA: Diagnosis not present

## 2019-07-04 DIAGNOSIS — C44622 Squamous cell carcinoma of skin of right upper limb, including shoulder: Secondary | ICD-10-CM | POA: Diagnosis not present

## 2019-07-04 DIAGNOSIS — R002 Palpitations: Secondary | ICD-10-CM

## 2019-07-04 DIAGNOSIS — Z85828 Personal history of other malignant neoplasm of skin: Secondary | ICD-10-CM | POA: Diagnosis not present

## 2019-07-04 DIAGNOSIS — D485 Neoplasm of uncertain behavior of skin: Secondary | ICD-10-CM | POA: Diagnosis not present

## 2019-07-04 LAB — CUP PACEART REMOTE DEVICE CHECK
Date Time Interrogation Session: 20210327230411
Implantable Pulse Generator Implant Date: 20200918

## 2019-07-05 NOTE — Progress Notes (Signed)
ILR Remote 

## 2019-07-12 ENCOUNTER — Other Ambulatory Visit: Payer: Self-pay | Admitting: *Deleted

## 2019-07-12 ENCOUNTER — Encounter: Payer: Self-pay | Admitting: Cardiovascular Disease

## 2019-07-12 ENCOUNTER — Ambulatory Visit: Payer: PPO | Admitting: Cardiovascular Disease

## 2019-07-12 ENCOUNTER — Other Ambulatory Visit: Payer: Self-pay

## 2019-07-12 VITALS — BP 112/70 | HR 60 | Ht 71.0 in | Wt 177.0 lb

## 2019-07-12 DIAGNOSIS — Z79899 Other long term (current) drug therapy: Secondary | ICD-10-CM

## 2019-07-12 DIAGNOSIS — I480 Paroxysmal atrial fibrillation: Secondary | ICD-10-CM

## 2019-07-12 DIAGNOSIS — I10 Essential (primary) hypertension: Secondary | ICD-10-CM

## 2019-07-12 DIAGNOSIS — E785 Hyperlipidemia, unspecified: Secondary | ICD-10-CM

## 2019-07-12 NOTE — Assessment & Plan Note (Signed)
History of noncritical CAD by cath by cath performed Dr. [name] in 2008 with normal LV function.  He denies chest pain or shortness of breath.

## 2019-07-12 NOTE — Patient Instructions (Signed)
Your physician recommends that you continue on your current medications as directed. Please refer to the Current Medication list given to you today.  Your physician recommends that you return for lab work in: [place] wants you to follow-up in:  [place] will receive a reminder letter in the mail two months in advance. If you don't receive a letter, please call our office to schedule the follow-up appointment.\

## 2019-07-12 NOTE — Assessment & Plan Note (Signed)
History of hyperlipidemia on statin therapy.  We will recheck a lipid liver profile 

## 2019-07-12 NOTE — Progress Notes (Signed)
07/12/2019 Luis [name]   1945/10/26  [id number]  Primary Physician No primary care provider on file. Primary Cardiologist: Lorretta Harp MD FACP, [place], [place], [place]  HPI:  Luis [name] is a 74 y.o.  thin and fit-appearing, married Caucasian male, father of 68, grandfather to 2 grandchildren who I last saw him in the office  09/01/2017. He has a history of paroxysmal atrial fibrillation maintaining sinus rhythm when I saw him in December. He has been evaluated by [name] at [facility] remotely back in May 2008 and was cath'd by Dr. [name] the month before revealing noncritical CAD with normal LV function. He does have a 35-pack-year history of tobacco abuse having quit August 1996. His other problems include history of hypertension, mild hyperlipidemia and family history of heart disease with a father who had an MI at age 82 and a brother who had stent placed at age 80. He did get occasional palpitations when I saw him back in December. He has had 2 episodes of symptomatic A-fib, the last of which occurred on June 19, 2012, which resulted in presyncope. He noticed this while he was playing golf. When he presented to the ER his heart rate was 144. He was placed on IV diltiazem and ultimately converted to sinus rhythm and was discharged home on flecainide 50 mg p.o. b.i.d. Because his CHADS2 score was 1 it was elected to keep him on aspirin.he underwent atrial fibrillation ablation by [name] on 09/07/12 with an excellent clinical result. He's had no recurrent episodes appear PAF. His Flecainide was discontinued. He is on Xarelto oral anticoagulation. He denies chest pain or shortness of breath. He had an abdominal duplex that showed no evidence of aneurysm.  He had an implantable loop recorder placed by Dr. [name] in October2016.[name] He's had one episode of breakthrough PAF since his ablation 3 years ago.Since I saw him a year ago his A. fib burden is 0. He's had no breakthrough  episodes remains on Xarelto oral anticoagulation.  He denies chest pain or shortness of breath.  He did see Kerin Ransom back in the office on 3/1 for dizziness/presyncope of unclear etiology.  He had nephrolithiasis approxi-2 months ago with hematuria and stopped his Xarelto at that time.   Current Meds  Medication Sig   metoprolol tartrate (LOPRESSOR) 50 MG tablet Take 1 tablet by mouth twice daily   omeprazole (PRILOSEC) 20 MG capsule Take 20 mg by mouth daily.   rosuvastatin (CRESTOR) 10 MG tablet Take 1 tablet by mouth once daily (Patient taking differently: 2 (two) times daily at 8 am and 4 pm. )     No Known Allergies  Social History   Socioeconomic History   Marital status: Married    Spouse name: Not on file   Number of children: Not on file   Years of education: Not on file   Highest education level: Not on file  Occupational History   Not on file  Tobacco Use   Smoking status: Former Smoker    Years: 35.00    Types: Cigarettes    Quit date: 11/27/1994    Years since quitting: 24.6   Smokeless tobacco: Never Used  Substance and Sexual Activity   Alcohol use: Yes    Alcohol/week: 7.0 standard drinks    Types: 7 Standard drinks or equivalent per week    Comment: 3-4 oz daily 1/2 drinks per night   Drug use: No   Sexual activity: Not  on file  Other Topics Concern   Not on file  Social History Narrative   Lives in [place] with spouse.   Retired Electrical engineer   Social Determinants of Radio broadcast assistant Strain:    Difficulty of Paying Living Expenses:   Food Insecurity:    Worried About Charity fundraiser in the Last Year:    Arboriculturist in the Last Year:   Transportation Needs:    Film/video editor (Medical):    Lack of Transportation (Non-Medical):   Physical Activity:    Days of Exercise per Week:    Minutes of Exercise per Session:   Stress:    Feeling of Stress :   Social Connections:    Frequency  of Communication with Friends and Family:    Frequency of Social Gatherings with Friends and Family:    Attends Religious Services:    Active Member of Clubs or Organizations:    Attends Music therapist:    Marital Status:   Intimate Partner Violence:    Fear of Current or Ex-Partner:    Emotionally Abused:    Physically Abused:    Sexually Abused:      Review of Systems: General: negative for chills, fever, night sweats or weight changes.  Cardiovascular: negative for chest pain, dyspnea on exertion, edema, orthopnea, palpitations, paroxysmal nocturnal dyspnea or shortness of breath Dermatological: negative for rash Respiratory: negative for cough or wheezing Urologic: negative for hematuria Abdominal: negative for nausea, vomiting, diarrhea, bright red blood per rectum, melena, or hematemesis Neurologic: negative for visual changes, syncope, or dizziness All other systems reviewed and are otherwise negative except as noted above.    Blood pressure 112/70, pulse 60, height 5\' 11"  (1.803 m), weight 177 lb (80.3 kg).  General appearance: alert and no distress Neck: no adenopathy, no carotid bruit, no JVD, supple, symmetrical, trachea midline and thyroid not enlarged, symmetric, no tenderness/mass/nodules Lungs: clear to auscultation bilaterally Heart: regular rate and rhythm, S1, S2 normal, no murmur, click, rub or gallop Extremities: extremities normal, atraumatic, no cyanosis or edema Pulses: 2+ and symmetric Skin: Skin color, texture, turgor normal. No rashes or lesions Neurologic: Alert and oriented X 3, normal strength and tone. Normal symmetric reflexes. Normal coordination and gait  EKG not performed today  ASSESSMENT AND PLAN:   HYPERLIPIDEMIA History of hyperlipidemia on statin therapy.  We will recheck a lipid liver profile  Essential hypertension History of essential hypertension with blood pressure measured today 112/70.  He is on  metoprolol.  Paroxysmal atrial fibrillation- RFA June 2014 History of paroxysmal A. fib status post RF ablation by Dr. [name] in 2014 without recurrence.  Does have a loop recorder in place.  He was on Xarelto oral anticoagulation up until 2 months ago when he developed nephrolithiasis and hematuria and this was discontinued. The CHA2DSVASC2 score is  3 .  At this point we will hold off on reanticoagulation unless he has recurrent A. fib.  CAD (coronary artery disease) History of noncritical CAD by cath by cath performed Dr. [name] in 2008 with normal LV function.  He denies chest pain or shortness of breath.      Lorretta Harp MD FACP,FACC,FAHA, [facility] 07/12/2019 8:32 AM

## 2019-07-12 NOTE — Assessment & Plan Note (Signed)
History of paroxysmal A. fib status post RF ablation by Dr. [name] in 2014 without recurrence.  Does have a loop recorder in place.  Luis [name] was on Xarelto oral anticoagulation up until 2 months ago when Luis [name] developed nephrolithiasis and hematuria and this was discontinued. The CHA2DSVASC2 score is  3 .  At this point we will hold off on reanticoagulation unless Luis [name] has recurrent A. fib.

## 2019-07-12 NOTE — Assessment & Plan Note (Signed)
History of essential hypertension with blood pressure measured today 112/70.  He is on metoprolol.

## 2019-07-13 LAB — BASIC METABOLIC PANEL
BUN/Creatinine Ratio: 14 (ref 10–24)
BUN: 15 mg/dL (ref 8–27)
CO2: 25 mmol/L (ref 20–29)
Calcium: 10.2 mg/dL (ref 8.6–10.2)
Chloride: 103 mmol/L (ref 96–106)
Creatinine, Ser: 1.07 mg/dL (ref 0.76–1.27)
GFR calc Af Amer: 78 mL/min/{1.73_m2} (ref >59)
GFR calc non Af Amer: 68 mL/min/{1.73_m2} (ref >59)
Glucose: 80 mg/dL (ref 65–99)
Potassium: 5.1 mmol/L (ref 3.5–5.2)
Sodium: 142 mmol/L (ref 134–144)

## 2019-07-13 LAB — HEPATIC FUNCTION PANEL
ALT: 16 IU/L (ref 0–44)
AST: 23 IU/L (ref 0–40)
Albumin: 4.3 g/dL (ref 3.7–4.7)
Alkaline Phosphatase: 104 IU/L (ref 39–117)
Bilirubin Total: 0.7 mg/dL (ref 0.0–1.2)
Bilirubin, Direct: 0.2 mg/dL (ref 0.00–0.40)
Total Protein: 6.4 g/dL (ref 6.0–8.5)

## 2019-07-13 LAB — LIPID PANEL
Chol/HDL Ratio: 2.6 ratio (ref 0.0–5.0)
Cholesterol, Total: 128 mg/dL (ref 100–199)
HDL: 50 mg/dL (ref >39)
LDL Chol Calc (NIH): 58 mg/dL (ref 0–99)
Triglycerides: 108 mg/dL (ref 0–149)
VLDL Cholesterol Cal: 20 mg/dL (ref 5–40)

## 2019-07-13 LAB — CBC
Hematocrit: 46.4 % (ref 37.5–51.0)
Hemoglobin: 16.4 g/dL (ref 13.0–17.7)
MCH: 33.3 pg — ABNORMAL HIGH (ref 26.6–33.0)
MCHC: 35.3 g/dL (ref 31.5–35.7)
MCV: 94 fL (ref 79–97)
Platelets: 158 10*3/uL (ref 150–450)
RBC: 4.93 x10E6/uL (ref 4.14–5.80)
RDW: 13.2 % (ref 11.6–15.4)
WBC: 5 10*3/uL (ref 3.4–10.8)

## 2019-08-04 LAB — CUP PACEART REMOTE DEVICE CHECK
Date Time Interrogation Session: 20210428230550
Implantable Pulse Generator Implant Date: 20200918

## 2019-08-08 ENCOUNTER — Ambulatory Visit (INDEPENDENT_AMBULATORY_CARE_PROVIDER_SITE_OTHER): Payer: PPO | Admitting: *Deleted

## 2019-08-08 DIAGNOSIS — I480 Paroxysmal atrial fibrillation: Secondary | ICD-10-CM | POA: Diagnosis not present

## 2019-08-09 NOTE — Progress Notes (Signed)
Carelink Summary Report / Loop Recorder 

## 2019-08-19 DIAGNOSIS — M1712 Unilateral primary osteoarthritis, left knee: Secondary | ICD-10-CM | POA: Diagnosis not present

## 2019-08-19 DIAGNOSIS — M25562 Pain in left knee: Secondary | ICD-10-CM | POA: Diagnosis not present

## 2019-08-19 DIAGNOSIS — M25561 Pain in right knee: Secondary | ICD-10-CM | POA: Diagnosis not present

## 2019-08-19 DIAGNOSIS — M1711 Unilateral primary osteoarthritis, right knee: Secondary | ICD-10-CM | POA: Diagnosis not present

## 2019-08-19 DIAGNOSIS — M25569 Pain in unspecified knee: Secondary | ICD-10-CM | POA: Diagnosis not present

## 2019-09-06 ENCOUNTER — Telehealth: Payer: Self-pay

## 2019-09-06 ENCOUNTER — Ambulatory Visit (INDEPENDENT_AMBULATORY_CARE_PROVIDER_SITE_OTHER): Payer: PPO | Admitting: *Deleted

## 2019-09-06 DIAGNOSIS — I480 Paroxysmal atrial fibrillation: Secondary | ICD-10-CM

## 2019-09-06 NOTE — Progress Notes (Signed)
Carelink Summary Report / Loop Recorder 

## 2019-09-06 NOTE — Telephone Encounter (Signed)
Spoke with pt,  Advised of normal HR during episode.  Pt indicates he was unable to check BP at time of episode.

## 2019-09-06 NOTE — Telephone Encounter (Signed)
Patient called and just wants someone to go over his transmission that he sent in on Saturday. He states he thinks he was having a episode. He feels fine today he is just concerned that's all.

## 2019-09-07 LAB — CUP PACEART REMOTE DEVICE CHECK
Date Time Interrogation Session: 20210529230042
Implantable Pulse Generator Implant Date: 20200918

## 2019-09-13 DIAGNOSIS — I10 Essential (primary) hypertension: Secondary | ICD-10-CM | POA: Diagnosis not present

## 2019-09-13 DIAGNOSIS — M1990 Unspecified osteoarthritis, unspecified site: Secondary | ICD-10-CM | POA: Diagnosis not present

## 2019-09-13 DIAGNOSIS — Z8679 Personal history of other diseases of the circulatory system: Secondary | ICD-10-CM | POA: Diagnosis not present

## 2019-09-13 DIAGNOSIS — E7849 Other hyperlipidemia: Secondary | ICD-10-CM | POA: Diagnosis not present

## 2019-09-13 DIAGNOSIS — D692 Other nonthrombocytopenic purpura: Secondary | ICD-10-CM | POA: Diagnosis not present

## 2019-09-13 DIAGNOSIS — Z87442 Personal history of urinary calculi: Secondary | ICD-10-CM | POA: Diagnosis not present

## 2019-09-21 ENCOUNTER — Telehealth: Payer: Self-pay

## 2019-09-21 NOTE — Telephone Encounter (Signed)
Pt is calling to report that he feels very fatigued later in the day. He feels fine when waking up but throughout the day he begins to feel "worn out" and he suspects that it may be his Metoprolol dose being too high. His most recent BP was 128/60. He states that it averages 126-128/60's. His HR averages between 60-65 bpm. He denies any CP, SOB or lightheadedness. He is hoping to decrease his current dose of 50mg  twice daily to see if that helps with his fatigue.  Will route to JB, MD for advisement.

## 2019-09-25 NOTE — Telephone Encounter (Signed)
Message routed to incorrect provider

## 2019-10-07 ENCOUNTER — Ambulatory Visit (INDEPENDENT_AMBULATORY_CARE_PROVIDER_SITE_OTHER): Payer: PPO | Admitting: *Deleted

## 2019-10-07 DIAGNOSIS — I480 Paroxysmal atrial fibrillation: Secondary | ICD-10-CM

## 2019-10-07 DIAGNOSIS — J020 Streptococcal pharyngitis: Secondary | ICD-10-CM | POA: Diagnosis not present

## 2019-10-07 DIAGNOSIS — D692 Other nonthrombocytopenic purpura: Secondary | ICD-10-CM | POA: Diagnosis not present

## 2019-10-07 LAB — CUP PACEART REMOTE DEVICE CHECK
Date Time Interrogation Session: 20210701230511
Implantable Pulse Generator Implant Date: 20200918

## 2019-10-11 NOTE — Progress Notes (Signed)
Carelink Summary Report / Loop Recorder 

## 2019-10-23 ENCOUNTER — Other Ambulatory Visit: Payer: Self-pay | Admitting: Cardiovascular Disease

## 2019-10-24 DIAGNOSIS — N202 Calculus of kidney with calculus of ureter: Secondary | ICD-10-CM | POA: Diagnosis not present

## 2019-10-25 DIAGNOSIS — H43393 Other vitreous opacities, bilateral: Secondary | ICD-10-CM | POA: Diagnosis not present

## 2019-10-25 DIAGNOSIS — H2513 Age-related nuclear cataract, bilateral: Secondary | ICD-10-CM | POA: Diagnosis not present

## 2019-10-25 DIAGNOSIS — H31092 Other chorioretinal scars, left eye: Secondary | ICD-10-CM | POA: Diagnosis not present

## 2019-10-25 NOTE — Telephone Encounter (Signed)
Called patient and spoke with him and he stated he takes his Crestor 10 mg once daily at night before bed and he does not know why last office visit says he takes twice daily.

## 2019-11-14 ENCOUNTER — Ambulatory Visit (INDEPENDENT_AMBULATORY_CARE_PROVIDER_SITE_OTHER): Payer: PPO | Admitting: *Deleted

## 2019-11-14 DIAGNOSIS — R55 Syncope and collapse: Secondary | ICD-10-CM

## 2019-11-14 DIAGNOSIS — I480 Paroxysmal atrial fibrillation: Secondary | ICD-10-CM | POA: Diagnosis not present

## 2019-11-14 DIAGNOSIS — I4891 Unspecified atrial fibrillation: Secondary | ICD-10-CM

## 2019-11-14 LAB — CUP PACEART REMOTE DEVICE CHECK
Date Time Interrogation Session: 20210803230422
Implantable Pulse Generator Implant Date: 20200918

## 2019-11-15 ENCOUNTER — Telehealth: Payer: Self-pay

## 2019-11-15 NOTE — Progress Notes (Signed)
Carelink Summary Report / Loop Recorder 

## 2019-11-15 NOTE — Telephone Encounter (Signed)
Spoke with pt.  He was concerned about diagnosis code of Near syncope that was attached to Loop Recorder summary report.  Explained to pt that the diagnosis code is used for billing purposes it does not indicate that he had recent near syncope.  Pt requested that the code be updated to Atrial Fibrillation as previously indicated.  Advised I would request change be made.

## 2019-12-14 LAB — CUP PACEART REMOTE DEVICE CHECK
Date Time Interrogation Session: 20210905230347
Implantable Pulse Generator Implant Date: 20200918

## 2019-12-19 ENCOUNTER — Encounter: Payer: PPO | Admitting: Internal Medicine

## 2019-12-19 ENCOUNTER — Ambulatory Visit (INDEPENDENT_AMBULATORY_CARE_PROVIDER_SITE_OTHER): Payer: PPO | Admitting: *Deleted

## 2019-12-19 DIAGNOSIS — R55 Syncope and collapse: Secondary | ICD-10-CM | POA: Diagnosis not present

## 2019-12-20 NOTE — Progress Notes (Signed)
Carelink Summary Report / Loop Recorder 

## 2020-01-04 DIAGNOSIS — E785 Hyperlipidemia, unspecified: Secondary | ICD-10-CM | POA: Diagnosis not present

## 2020-01-04 DIAGNOSIS — I10 Essential (primary) hypertension: Secondary | ICD-10-CM | POA: Diagnosis not present

## 2020-01-04 DIAGNOSIS — Z125 Encounter for screening for malignant neoplasm of prostate: Secondary | ICD-10-CM | POA: Diagnosis not present

## 2020-01-11 DIAGNOSIS — M25562 Pain in left knee: Secondary | ICD-10-CM | POA: Diagnosis not present

## 2020-01-11 DIAGNOSIS — M25561 Pain in right knee: Secondary | ICD-10-CM | POA: Diagnosis not present

## 2020-01-11 DIAGNOSIS — M170 Bilateral primary osteoarthritis of knee: Secondary | ICD-10-CM | POA: Diagnosis not present

## 2020-01-14 LAB — CUP PACEART REMOTE DEVICE CHECK
Date Time Interrogation Session: 20211008230123
Implantable Pulse Generator Implant Date: 20200918

## 2020-01-16 DIAGNOSIS — I10 Essential (primary) hypertension: Secondary | ICD-10-CM | POA: Diagnosis not present

## 2020-01-16 DIAGNOSIS — Z86010 Personal history of colonic polyps: Secondary | ICD-10-CM | POA: Diagnosis not present

## 2020-01-16 DIAGNOSIS — Z87442 Personal history of urinary calculi: Secondary | ICD-10-CM | POA: Diagnosis not present

## 2020-01-16 DIAGNOSIS — D692 Other nonthrombocytopenic purpura: Secondary | ICD-10-CM | POA: Diagnosis not present

## 2020-01-16 DIAGNOSIS — R82998 Other abnormal findings in urine: Secondary | ICD-10-CM | POA: Diagnosis not present

## 2020-01-16 DIAGNOSIS — I4891 Unspecified atrial fibrillation: Secondary | ICD-10-CM | POA: Diagnosis not present

## 2020-01-16 DIAGNOSIS — E785 Hyperlipidemia, unspecified: Secondary | ICD-10-CM | POA: Diagnosis not present

## 2020-01-16 DIAGNOSIS — Z23 Encounter for immunization: Secondary | ICD-10-CM | POA: Diagnosis not present

## 2020-01-16 DIAGNOSIS — E162 Hypoglycemia, unspecified: Secondary | ICD-10-CM | POA: Diagnosis not present

## 2020-01-16 DIAGNOSIS — K222 Esophageal obstruction: Secondary | ICD-10-CM | POA: Diagnosis not present

## 2020-01-16 DIAGNOSIS — M1990 Unspecified osteoarthritis, unspecified site: Secondary | ICD-10-CM | POA: Diagnosis not present

## 2020-01-16 DIAGNOSIS — Z0000 Encounter for general adult medical examination without abnormal findings: Secondary | ICD-10-CM | POA: Diagnosis not present

## 2020-01-23 ENCOUNTER — Ambulatory Visit (INDEPENDENT_AMBULATORY_CARE_PROVIDER_SITE_OTHER): Payer: PPO

## 2020-01-23 DIAGNOSIS — I480 Paroxysmal atrial fibrillation: Secondary | ICD-10-CM

## 2020-01-27 ENCOUNTER — Ambulatory Visit (INDEPENDENT_AMBULATORY_CARE_PROVIDER_SITE_OTHER): Payer: PPO | Admitting: Cardiology

## 2020-01-27 ENCOUNTER — Other Ambulatory Visit: Payer: Self-pay

## 2020-01-27 ENCOUNTER — Encounter: Payer: Self-pay | Admitting: Cardiology

## 2020-01-27 VITALS — BP 144/64 | HR 57 | Ht 71.0 in | Wt 174.2 lb

## 2020-01-27 DIAGNOSIS — E785 Hyperlipidemia, unspecified: Secondary | ICD-10-CM | POA: Diagnosis not present

## 2020-01-27 DIAGNOSIS — I25119 Atherosclerotic heart disease of native coronary artery with unspecified angina pectoris: Secondary | ICD-10-CM

## 2020-01-27 DIAGNOSIS — I480 Paroxysmal atrial fibrillation: Secondary | ICD-10-CM

## 2020-01-27 DIAGNOSIS — Z95818 Presence of other cardiac implants and grafts: Secondary | ICD-10-CM

## 2020-01-27 DIAGNOSIS — I10 Essential (primary) hypertension: Secondary | ICD-10-CM

## 2020-01-27 DIAGNOSIS — Z7901 Long term (current) use of anticoagulants: Secondary | ICD-10-CM

## 2020-01-27 DIAGNOSIS — K921 Melena: Secondary | ICD-10-CM | POA: Diagnosis not present

## 2020-01-27 DIAGNOSIS — Z8249 Family history of ischemic heart disease and other diseases of the circulatory system: Secondary | ICD-10-CM

## 2020-01-27 NOTE — Progress Notes (Signed)
Cardiology Office Note:    Date:  01/27/2020   ID:  Luis [name], Luis [name] 26-Jul-1945, MRN [id number]  PCP:  Sueanne Margarita, DO  Cardiologist:  Quay Burow, MD  Electrophysiologist:  None   Referring MD: Sueanne Margarita, DO   No chief complaint on file.   History of Present Illness:    Luis [name] is a 74 y.o. male with a hx of coronary disease at catheterization in 2008 with a 40 to 50% diagonal narrowing.  Myoview in 2014 was low risk. He has a strong family history of coronary disease, his brother died suddenly at 10. He has a history of PAF.  He underwent radiofrequency ablation in 2014.  He had a Linq recorder implanted in 2016, this was replaced with a [facility] September 2020.  He is done well from atrial fibrillation standpoint, no significant atrial fibrillation apparently documented since 2014.  He did have some gross hematuria issues this past year associated with kidney stones.  After discussion with Dr. [name] in September 2020 it was decided stop anticoagulation.   I saw him in March 2021 and he had had some dizziness.  At that time he reconsidered a Lexiscan as his monitor did not show any atrial fibrillation and his blood pressure was stable.  He decided to hold off.  He saw Dr. Alvester Chou the next month and was doing well.  Patient is seen in the office today for routine follow-up.  Since we saw him last he continues to do well.  He is active.  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 Ihor Gully.  He was recently at the [employer].  He said he was sitting on the beach and he had a brief episode where he was not sure where he slept the night before.  He said this was only for seconds.  He did not have any facial droop vision loss or limb weakness.  He had no tachycardia and does not think he has had atrial fibrillation.  His Linq recorder review from September confirms sinus rhythm.  Past Medical History:  Diagnosis Date  . Allergy    SEASONAL  . Arthritis    BACK AND KNEES   . Complication of anesthesia    pt states aspirated following ablation   . Dysrhythmia   . GERD (gastroesophageal reflux disease)   . History of kidney stones 07/2006   x 1  . Hyperlipidemia   . Hypertension   . Normal coronary arteries 2008   Normal LV function  . Paroxysmal atrial fibrillation ([place])    2008 and 20014, PVI 09/07/12    Past Surgical History:  Procedure Laterality Date  . ATRIAL ABLATION SURGERY  09/07/12   PVI by Dr [name]  . ATRIAL FIBRILLATION ABLATION N/A 09/07/2012   Procedure: ATRIAL FIBRILLATION ABLATION;  Surgeon: Thompson Grayer, MD;  Location: [facility] CATH LAB;  Service: Cardiovascular;  Laterality: N/A;  . CARDIAC CATHETERIZATION Left 07/20/2006   No significant CAD, EF 60%, continue medical therapy  . COLONOSCOPY    . CYSTOSCOPY W/ RETROGRADES Left 02/28/2019   Procedure: CYSTOSCOPY WITH RETROGRADE PYELOGRAM;  Surgeon: Robley Fries, MD;  Location: [facility] ORS;  Service: Urology;  Laterality: Left;  . CYSTOSCOPY/URETEROSCOPY/HOLMIUM LASER/STENT PLACEMENT Left 03/08/2019   Procedure: CYSTOSCOPY/URETEROSCOPY/HOLMIUM LASER/STENT PLACEMENT;  Surgeon: Robley Fries, MD;  Location: [facility];  Service: Urology;  Laterality: Left;  1 HR  . EP IMPLANTABLE DEVICE N/A 01/23/2015   Procedure: Loop Recorder Insertion;  Surgeon:  Thompson Grayer, MD;  Location: [place] CV LAB;  Service: Cardiovascular;  Laterality: N/A;  . implantable loop recorder placement  12/24/2018   previous ILR removed and a new MDT Reveal CHYI5 implanted in its place in office by Dr [name]  . KNEE ARTHROSCOPY Right   . TEE WITHOUT CARDIOVERSION N/A 09/06/2012   Procedure: TRANSESOPHAGEAL ECHOCARDIOGRAM (TEE);  Surgeon: Pixie Casino, MD;  Location: [facility] ENDOSCOPY;  Service: Cardiovascular;  Laterality: N/A;  . UPPER GASTROINTESTINAL ENDOSCOPY      Current Medications: Current Meds  Medication Sig  . metoprolol tartrate (LOPRESSOR) 50 MG tablet Take 1 tablet by mouth twice daily  .  omeprazole (PRILOSEC) 20 MG capsule Take 20 mg by mouth daily.  . rosuvastatin (CRESTOR) 10 MG tablet Take 1 tablet (10 mg total) by mouth daily.     Allergies:   Patient has no known allergies.   Social History   Socioeconomic History  . Marital status: Married    Spouse name: Not on file  . Number of children: Not on file  . Years of education: Not on file  . Highest education level: Not on file  Occupational History  . Not on file  Tobacco Use  . Smoking status: Former Smoker    Years: 35.00    Types: Cigarettes    Quit date: 11/27/1994    Years since quitting: 25.1  . Smokeless tobacco: Never Used  Vaping Use  . Vaping Use: Never used  Substance and Sexual Activity  . Alcohol use: Yes    Alcohol/week: 7.0 standard drinks    Types: 7 Standard drinks or equivalent per week    Comment: 3-4 oz daily 1/2 drinks per night  . Drug use: No  . Sexual activity: Not on file  Other Topics Concern  . Not on file  Social History Narrative   Lives in [place] with spouse.   Retired Electrical engineer   Social Determinants of Radio broadcast assistant Strain:   . Difficulty of Paying Living Expenses: Not on file  Food Insecurity:   . Worried About Charity fundraiser in the Last Year: Not on file  . Ran Out of Food in the Last Year: Not on file  Transportation Needs:   . Lack of Transportation (Medical): Not on file  . Lack of Transportation (Non-Medical): Not on file  Physical Activity:   . Days of Exercise per Week: Not on file  . Minutes of Exercise per Session: Not on file  Stress:   . Feeling of Stress : Not on file  Social Connections:   . Frequency of Communication with Friends and Family: Not on file  . Frequency of Social Gatherings with Friends and Family: Not on file  . Attends Religious Services: Not on file  . Active Member of Clubs or Organizations: Not on file  . Attends Archivist Meetings: Not on file  . Marital Status: Not on file      Family History: The patient's family history includes CAD in his father; Heart Problems in his father; Heart Problems (age of onset: 28) in his brother; Hypertension in his father; Pancreatic cancer in his mother; Stroke in his father.  ROS:   Please see the history of present illness.    HOH- hearing aids DJD knees  All other systems reviewed and are negative.  EKGs/Labs/Other Studies Reviewed:    The following studies were reviewed today: Echo April 2014  EKG:  EKG is ordered today.  The  ekg ordered today demonstrates NSR, HR 58  Recent Labs: 07/12/2019: ALT 16; BUN 15; Creatinine, Ser 1.07; Hemoglobin 16.4; Platelets 158; Potassium 5.1; Sodium 142  Recent Lipid Panel    Component Value Date/Time   CHOL 128 07/12/2019 1500   TRIG 108 07/12/2019 1500   HDL 50 07/12/2019 1500   CHOLHDL 2.6 07/12/2019 1500   CHOLHDL 5.1 07/24/2014 0810   VLDL 35 07/24/2014 0810   LDLCALC 58 07/12/2019 1500    Physical Exam:    VS:  BP (!) 144/64   Pulse (!) 57   Ht 5\' 11"  (1.803 m)   Wt 174 lb 3.2 oz (79 kg)   BMI 24.30 kg/m     Wt Readings from Last 3 Encounters:  01/27/20 174 lb 3.2 oz (79 kg)  07/12/19 177 lb (80.3 kg)  06/06/19 175 lb 6.4 oz (79.6 kg)     GEN: Well nourished, well developed in no acute distress HEENT: Normal NECK: No JVD; No carotid bruits LYMPHATICS: No lymphadenopathy CARDIAC: RRR, no murmurs, rubs, gallops RESPIRATORY:  Clear to auscultation without rales, wheezing or rhonchi  ABDOMEN: Soft, non-tender, non-distended MUSCULOSKELETAL:  No edema; No deformity  SKIN: Warm and dry NEUROLOGIC:  Alert and oriented x 3- exam grossly intact PSYCHIATRIC:  Normal affect   ASSESSMENT:    CAD (coronary artery disease) 40-50% Dx at cath in 2008-low risk Myoview in 2014 No angina  Paroxysmal atrial fibrillation- RFA June 2014 RFA in 2014- no recurrence since  Essential hypertension Controlled  Chronic anticoagulation Stopped Dec 2020 secondary to  hematuria  Status post placement of implantable loop recorder Loop upgraded 2020  Dyslipidemia LDL 42  Family history of coronary artery disease in brother Brother died at 86 of an MI  PLAN:    I discussed possibly obtaining carotid dopplers after the episode he had at the beach. He will hold off for now but would like to pursue this if he has another episode.  I discussed ASA 81 mg, for now we'll hold off but I'll review with Dr Gwenlyn Found- he did have some CAD documented in the past at cath.  F/U in 6 months.   Medication Adjustments/Labs and Tests Ordered: Current medicines are reviewed at length with the patient today.  Concerns regarding medicines are outlined above.  Orders Placed This Encounter  Procedures  . EKG 12-Lead   No orders of the defined types were placed in this encounter.   Patient Instructions  Medication Instructions:  Continue current medications  *If you need a refill on your cardiac medications before your next appointment, please call your pharmacy*   Lab Work: None Ordered   Testing/Procedures: None Ordered   Follow-Up: At [facility], you and your health needs are our priority.  As part of our continuing mission to provide you with exceptional heart care, we have created designated Provider Care Teams.  These Care Teams include your primary Cardiologist (physician) and Advanced Practice Providers (APPs -  Physician Assistants and Nurse Practitioners) who all work together to provide you with the care you need, when you need it.  We recommend signing up for the patient portal called "MyChart".  Sign up information is provided on this After Visit Summary.  MyChart is used to connect with patients for Virtual Visits (Telemedicine).  Patients are able to view lab/test results, encounter notes, upcoming appointments, etc.  Non-urgent messages can be sent to your provider as well.   To learn more about what you can do with MyChart, go to  [email or web address].    Your next appointment:   6 month(s)  The format for your next appointment:   In Person  Provider:   You may see Quay Burow, MD or one of the following Advanced Practice Providers on your designated Care Team:    Kerin Ransom, PA-C  [place], [place]  Coletta Memos, FNP        Signed, Kerin Ransom, [place]  01/27/2020 8:54 AM    [place]

## 2020-01-27 NOTE — Assessment & Plan Note (Signed)
RFA in 2014- no recurrence since

## 2020-01-27 NOTE — Assessment & Plan Note (Signed)
Loop upgraded 2020

## 2020-01-27 NOTE — Assessment & Plan Note (Signed)
LDL 70

## 2020-01-27 NOTE — Patient Instructions (Signed)

## 2020-01-27 NOTE — Assessment & Plan Note (Signed)
Stopped Dec 2020 secondary to hematuria

## 2020-01-27 NOTE — Assessment & Plan Note (Signed)
Brother died at 14 of an MI

## 2020-01-27 NOTE — Assessment & Plan Note (Signed)
40-50% Dx at cath in 2008-low risk Myoview in 2014 No angina

## 2020-01-27 NOTE — Assessment & Plan Note (Signed)
Controlled.  

## 2020-01-30 ENCOUNTER — Telehealth: Payer: Self-pay | Admitting: *Deleted

## 2020-01-30 NOTE — Progress Notes (Signed)
Carelink Summary Report / Loop Recorder 

## 2020-01-30 NOTE — Telephone Encounter (Signed)
-----   Message from Erlene Quan, [place] sent at 01/30/2020 12:57 PM EDT ----- Hebert Soho can you let Mr Waln know that I reviewed with Dr Gwenlyn Found and he feels the patient should be on an 81 mg Aspirin daily.  Kerin Ransom PA-C 01/30/2020 12:58 PM

## 2020-01-30 NOTE — Telephone Encounter (Signed)
Spoke with pt about Luis [name] recommendations, advised pt to start Aspirin 81 mg by mouth daily, pt voice understanding and thanks.

## 2020-02-06 DIAGNOSIS — Z1212 Encounter for screening for malignant neoplasm of rectum: Secondary | ICD-10-CM | POA: Diagnosis not present

## 2020-02-27 ENCOUNTER — Ambulatory Visit (INDEPENDENT_AMBULATORY_CARE_PROVIDER_SITE_OTHER): Payer: PPO

## 2020-02-27 DIAGNOSIS — I480 Paroxysmal atrial fibrillation: Secondary | ICD-10-CM

## 2020-02-27 LAB — CUP PACEART REMOTE DEVICE CHECK
Date Time Interrogation Session: 20211121230713
Implantable Pulse Generator Implant Date: 20200918

## 2020-02-28 NOTE — Progress Notes (Signed)
Carelink Summary Report / Loop Recorder 

## 2020-03-06 DIAGNOSIS — L570 Actinic keratosis: Secondary | ICD-10-CM | POA: Diagnosis not present

## 2020-03-06 DIAGNOSIS — L821 Other seborrheic keratosis: Secondary | ICD-10-CM | POA: Diagnosis not present

## 2020-03-06 DIAGNOSIS — D1801 Hemangioma of skin and subcutaneous tissue: Secondary | ICD-10-CM | POA: Diagnosis not present

## 2020-03-06 DIAGNOSIS — Z85828 Personal history of other malignant neoplasm of skin: Secondary | ICD-10-CM | POA: Diagnosis not present

## 2020-03-26 ENCOUNTER — Encounter: Payer: Self-pay | Admitting: Internal Medicine

## 2020-03-26 ENCOUNTER — Ambulatory Visit: Payer: PPO | Admitting: Internal Medicine

## 2020-03-26 ENCOUNTER — Other Ambulatory Visit: Payer: Self-pay

## 2020-03-26 VITALS — BP 138/70 | HR 68 | Ht 71.0 in | Wt 176.2 lb

## 2020-03-26 DIAGNOSIS — I480 Paroxysmal atrial fibrillation: Secondary | ICD-10-CM

## 2020-03-26 DIAGNOSIS — I10 Essential (primary) hypertension: Secondary | ICD-10-CM | POA: Diagnosis not present

## 2020-03-26 NOTE — Patient Instructions (Addendum)
Medication Instructions:  Your physician recommends that you continue on your current medications as directed. Please refer to the Current Medication list given to you today.  *If you need a refill on your cardiac medications before your next appointment, please call your pharmacy*  Lab Work: None ordered.  If you have labs (blood work) drawn today and your tests are completely normal, you will receive your results only by: [name] MyChart Message (if you have MyChart) OR . A paper copy in the mail If you have any lab test that is abnormal or we need to change your treatment, we will call you to review the results.  Testing/Procedures: None ordered.  Follow-Up: At [facility], you and your health needs are our priority.  As part of our continuing mission to provide you with exceptional heart care, we have created designated Provider Care Teams.  These Care Teams include your primary Cardiologist (physician) and Advanced Practice Providers (APPs -  Physician Assistants and Nurse Practitioners) who all work together to provide you with the care you need, when you need it.  We recommend signing up for the patient portal called "MyChart".  Sign up information is provided on this After Visit Summary.  MyChart is used to connect with patients for Virtual Visits (Telemedicine).  Patients are able to view lab/test results, encounter notes, upcoming appointments, etc.  Non-urgent messages can be sent to your provider as well.   To learn more about what you can do with MyChart, go to [email or web address].    Your next appointment:   Your physician wants you to follow-up in: 1 year with Luis [name]. You will receive a reminder letter in the mail two months in advance. If you don't receive a letter, please call our office to schedule the follow-up appointment.    Other Instructions:

## 2020-03-26 NOTE — Progress Notes (Signed)
PCP: Sueanne Margarita, DO   Primary EP: Dr [name]  Luis [name] is a 74 y.o. male who presents today for routine electrophysiology followup.  Since last being seen in our clinic, the patient reports doing very well.  Today, he denies symptoms of palpitations, chest pain, shortness of breath,  lower extremity edema, dizziness, presyncope, or syncope.  The patient is otherwise without complaint today.   Past Medical History:  Diagnosis Date  . Allergy    SEASONAL  . Arthritis    BACK AND KNEES  . Complication of anesthesia    pt states aspirated following ablation   . Dysrhythmia   . GERD (gastroesophageal reflux disease)   . History of kidney stones 07/2006   x 1  . Hyperlipidemia   . Hypertension   . Normal coronary arteries 2008   Normal LV function  . Paroxysmal atrial fibrillation ([place])    2008 and 20014, PVI 09/07/12   Past Surgical History:  Procedure Laterality Date  . ATRIAL ABLATION SURGERY  09/07/12   PVI by Dr [name]  . ATRIAL FIBRILLATION ABLATION N/A 09/07/2012   Procedure: ATRIAL FIBRILLATION ABLATION;  Surgeon: Thompson Grayer, MD;  Location: [facility] CATH LAB;  Service: Cardiovascular;  Laterality: N/A;  . CARDIAC CATHETERIZATION Left 07/20/2006   No significant CAD, EF 60%, continue medical therapy  . COLONOSCOPY    . CYSTOSCOPY W/ RETROGRADES Left 02/28/2019   Procedure: CYSTOSCOPY WITH RETROGRADE PYELOGRAM;  Surgeon: Robley Fries, MD;  Location: [facility] ORS;  Service: Urology;  Laterality: Left;  . CYSTOSCOPY/URETEROSCOPY/HOLMIUM LASER/STENT PLACEMENT Left 03/08/2019   Procedure: CYSTOSCOPY/URETEROSCOPY/HOLMIUM LASER/STENT PLACEMENT;  Surgeon: Robley Fries, MD;  Location: [facility];  Service: Urology;  Laterality: Left;  1 HR  . EP IMPLANTABLE DEVICE N/A 01/23/2015   Procedure: Loop Recorder Insertion;  Surgeon: Thompson Grayer, MD;  Location: [place] CV LAB;  Service: Cardiovascular;  Laterality: N/A;  . implantable loop recorder placement   12/24/2018   previous ILR removed and a new MDT Reveal [id number] implanted in its place in office by Dr [name]  . KNEE ARTHROSCOPY Right   . TEE WITHOUT CARDIOVERSION N/A 09/06/2012   Procedure: TRANSESOPHAGEAL ECHOCARDIOGRAM (TEE);  Surgeon: Pixie Casino, MD;  Location: [facility] ENDOSCOPY;  Service: Cardiovascular;  Laterality: N/A;  . UPPER GASTROINTESTINAL ENDOSCOPY      ROS- all systems are reviewed and negatives except as per HPI above  Current Outpatient Medications  Medication Sig Dispense Refill  . aspirin EC 81 MG tablet Take 81 mg by mouth daily. Swallow whole.    . metoprolol tartrate (LOPRESSOR) 50 MG tablet Take 1 tablet by mouth twice daily (Patient taking differently: Take 75 mg by mouth daily. 25 mg in the morning and 50 mg in the evening) 180 tablet 3  . omeprazole (PRILOSEC) 20 MG capsule Take 20 mg by mouth daily.    . rosuvastatin (CRESTOR) 10 MG tablet Take 1 tablet (10 mg total) by mouth daily. 90 tablet 3   No current facility-administered medications for this visit.    Physical Exam: Vitals:   03/26/20 1457  BP: 138/70  Pulse: 68  SpO2: 97%  Weight: 176 lb 3.2 oz (79.9 kg)  Height: 5\' 11"  (1.803 m)    GEN- The patient is well appearing, alert and oriented x 3 today.   Head- normocephalic, atraumatic Eyes-  Sclera clear, conjunctiva pink Ears- hearing intact Oropharynx- clear Lungs- Clear to ausculation bilaterally, normal work of breathing Heart- Regular rate and rhythm, no  murmurs, rubs or gallops, PMI not laterally displaced GI- soft, NT, ND, + BS Extremities- no clubbing, cyanosis, or edema  Wt Readings from Last 3 Encounters:  03/26/20 176 lb 3.2 oz (79.9 kg)  01/27/20 174 lb 3.2 oz (79 kg)  07/12/19 177 lb (80.3 kg)    EKG tracing ordered today is personally reviewed and shows sinus with incomplete rbbb  Assessment and Plan:  1. Paroxysmal atrial fibrillation Well controlled post ablation chads2vasc score is at least 3.  No afib by ILR. We will  consider [place] if his afib burden increases  2. HTN Stable No change required today   Risks, benefits and potential toxicities for medications prescribed and/or refilled reviewed with patient today.   Return to see EP PA in a year  Thompson Grayer MD, [facility] 03/26/2020 3:14 PM

## 2020-03-27 ENCOUNTER — Encounter: Payer: Self-pay | Admitting: *Deleted

## 2020-04-02 ENCOUNTER — Ambulatory Visit (INDEPENDENT_AMBULATORY_CARE_PROVIDER_SITE_OTHER): Payer: PPO

## 2020-04-02 DIAGNOSIS — I480 Paroxysmal atrial fibrillation: Secondary | ICD-10-CM | POA: Diagnosis not present

## 2020-04-03 LAB — CUP PACEART REMOTE DEVICE CHECK
Date Time Interrogation Session: 20211224230100
Implantable Pulse Generator Implant Date: 20200918

## 2020-04-12 NOTE — Progress Notes (Signed)
Carelink Summary Report / Loop Recorder 

## 2020-04-19 DIAGNOSIS — M25551 Pain in right hip: Secondary | ICD-10-CM | POA: Diagnosis not present

## 2020-04-19 DIAGNOSIS — M1711 Unilateral primary osteoarthritis, right knee: Secondary | ICD-10-CM | POA: Diagnosis not present

## 2020-04-19 DIAGNOSIS — M1712 Unilateral primary osteoarthritis, left knee: Secondary | ICD-10-CM | POA: Diagnosis not present

## 2020-04-19 DIAGNOSIS — M5459 Other low back pain: Secondary | ICD-10-CM | POA: Diagnosis not present

## 2020-04-19 DIAGNOSIS — M170 Bilateral primary osteoarthritis of knee: Secondary | ICD-10-CM | POA: Diagnosis not present

## 2020-05-03 LAB — CUP PACEART REMOTE DEVICE CHECK
Date Time Interrogation Session: 20220126230716
Implantable Pulse Generator Implant Date: 20200918

## 2020-05-07 ENCOUNTER — Ambulatory Visit (INDEPENDENT_AMBULATORY_CARE_PROVIDER_SITE_OTHER): Payer: PPO

## 2020-05-07 DIAGNOSIS — I480 Paroxysmal atrial fibrillation: Secondary | ICD-10-CM | POA: Diagnosis not present

## 2020-05-16 NOTE — Progress Notes (Signed)
Carelink Summary Report / Loop Recorder 

## 2020-06-11 ENCOUNTER — Ambulatory Visit (INDEPENDENT_AMBULATORY_CARE_PROVIDER_SITE_OTHER): Payer: PPO

## 2020-06-11 DIAGNOSIS — I480 Paroxysmal atrial fibrillation: Secondary | ICD-10-CM

## 2020-06-12 LAB — CUP PACEART REMOTE DEVICE CHECK
Date Time Interrogation Session: 20220228230829
Implantable Pulse Generator Implant Date: 20200918

## 2020-06-19 NOTE — Progress Notes (Signed)
Carelink Summary Report / Loop Recorder 

## 2020-07-09 ENCOUNTER — Ambulatory Visit (INDEPENDENT_AMBULATORY_CARE_PROVIDER_SITE_OTHER): Payer: PPO

## 2020-07-09 DIAGNOSIS — R55 Syncope and collapse: Secondary | ICD-10-CM | POA: Diagnosis not present

## 2020-07-11 ENCOUNTER — Ambulatory Visit: Payer: PPO | Admitting: Cardiovascular Disease

## 2020-07-11 ENCOUNTER — Encounter: Payer: Self-pay | Admitting: Cardiovascular Disease

## 2020-07-11 ENCOUNTER — Other Ambulatory Visit: Payer: Self-pay

## 2020-07-11 DIAGNOSIS — E785 Hyperlipidemia, unspecified: Secondary | ICD-10-CM | POA: Diagnosis not present

## 2020-07-11 DIAGNOSIS — I480 Paroxysmal atrial fibrillation: Secondary | ICD-10-CM | POA: Diagnosis not present

## 2020-07-11 DIAGNOSIS — I10 Essential (primary) hypertension: Secondary | ICD-10-CM

## 2020-07-11 LAB — CUP PACEART REMOTE DEVICE CHECK
Date Time Interrogation Session: 20220402230733
Implantable Pulse Generator Implant Date: 20200918

## 2020-07-11 NOTE — Assessment & Plan Note (Signed)
History of PAF status post RF ablation by Dr. [name] 09/07/2012.  He does have a loop recorder implanted 01/23/2015 which has repeatedly showed no atrial fibrillation.  He is not on oral anticoagulation.  He said no recurrent episodes of A. fib.

## 2020-07-11 NOTE — Assessment & Plan Note (Signed)
History of dyslipidemia on statin therapy with lipid profile performed 01/04/2020 revealing total cholesterol 122, LDL 62 and HDL 38.

## 2020-07-11 NOTE — Assessment & Plan Note (Signed)
History of essential hypertension a blood pressure measured today at 138/70.  He is on metoprolol.

## 2020-07-11 NOTE — Progress Notes (Signed)
07/11/2020 [name]   10-Sep-1945  [id number]  Primary Physician Sueanne Margarita, DO Primary Cardiologist: Lorretta Harp MD Gar[facility], [place], [place]  HPI:  Luis [name] is a 75 y.o.  thin and fit-appearing, married Caucasian male, father of 23, grandfather to 2 grandchildren who I last spoke to for a virtual telemedicine phone visit 09/01/2018.  He has a history of paroxysmal atrial fibrillation maintaining sinus rhythm when I saw him in December. He has been evaluated by [name] at [facility] remotely back in May 2008 and was cath'd by Dr. [name] the month before revealing noncritical CAD with normal LV function. He does have a 35-pack-year history of tobacco abuse having quit August 1996. His other problems include history of hypertension, mild hyperlipidemia and family history of heart disease with a father who had an MI at age 56 and a brother who had stent placed at age 64. He did get occasional palpitations when I saw him back in December. He has had 2 episodes of symptomatic A-fib, the last of which occurred on June 19, 2012, which resulted in presyncope. He noticed this while he was playing golf. When he presented to the ER his heart rate was 144. He was placed on IV diltiazem and ultimately converted to sinus rhythm and was discharged home on flecainide 50 mg p.o. b.i.d. Because his CHADS2 score was 1 it was elected to keep him on aspirin.he underwent atrial fibrillation ablation by [name] on 09/07/12 with an excellent clinical result. He's had no recurrent episodes appear PAF. His Flecainide was discontinued. He is on Xarelto oral anticoagulation. He denies chest pain or shortness of breath. He had an abdominal duplex that showed no evidence of aneurysm. He had an implantable loop recorder placed by Dr. [name] in October2016.[name] He's had one episode of breakthrough PAF since his ablation 3 years ago.Since I saw him a year ago his A. fib burden is 0. He's had no  breakthrough episodesremains on Xarelto oral anticoagulation.   Since I spoke to him 2 years ago he has seen Kerin Ransom, PA-C back in October and Dr. [name] in December 2021.  He said no breakthrough episodes of A. fib.  He denies chest pain or shortness of breath.  His loop recorder has shown no rhythm abnormalities as well.    Current Meds  Medication Sig  . aspirin EC 81 MG tablet Take 81 mg by mouth daily. Swallow whole.  . metoprolol tartrate (LOPRESSOR) 50 MG tablet Take 1 tablet by mouth twice daily (Patient taking differently: Take 75 mg by mouth daily. 25 mg in the morning and 50 mg in the evening)  . omeprazole (PRILOSEC) 20 MG capsule Take 20 mg by mouth daily.  . rosuvastatin (CRESTOR) 10 MG tablet Take 1 tablet (10 mg total) by mouth daily.     No Known Allergies  Social History   Socioeconomic History  . Marital status: Married    Spouse name: Not on file  . Number of children: Not on file  . Years of education: Not on file  . Highest education level: Not on file  Occupational History  . Not on file  Tobacco Use  . Smoking status: Former Smoker    Years: 35.00    Types: Cigarettes    Quit date: 11/27/1994    Years since quitting: 25.6  . Smokeless tobacco: Never Used  Vaping Use  . Vaping Use: Never used  Substance and Sexual Activity  .  Alcohol use: Yes    Alcohol/week: 7.0 standard drinks    Types: 7 Standard drinks or equivalent per week    Comment: 3-4 oz daily 1/2 drinks per night  . Drug use: No  . Sexual activity: Not on file  Other Topics Concern  . Not on file  Social History Narrative   Lives in [place] with spouse.   Retired Electrical engineer   Social Determinants of Radio broadcast assistant Strain: Not on file  Food Insecurity: Not on file  Transportation Needs: Not on file  Physical Activity: Not on file  Stress: Not on file  Social Connections: Not on file  Intimate Partner Violence: Not on file     Review of  Systems: General: negative for chills, fever, night sweats or weight changes.  Cardiovascular: negative for chest pain, dyspnea on exertion, edema, orthopnea, palpitations, paroxysmal nocturnal dyspnea or shortness of breath Dermatological: negative for rash Respiratory: negative for cough or wheezing Urologic: negative for hematuria Abdominal: negative for nausea, vomiting, diarrhea, bright red blood per rectum, melena, or hematemesis Neurologic: negative for visual changes, syncope, or dizziness All other systems reviewed and are otherwise negative except as noted above.    Blood pressure 138/70, pulse 61, height 5\' 11"  (1.803 m), weight 177 lb (80.3 kg), SpO2 98 %.  General appearance: alert and no distress Neck: no adenopathy, no carotid bruit, no JVD, supple, symmetrical, trachea midline and thyroid not enlarged, symmetric, no tenderness/mass/nodules Lungs: clear to auscultation bilaterally Heart: regular rate and rhythm, S1, S2 normal, no murmur, click, rub or gallop Extremities: extremities normal, atraumatic, no cyanosis or edema Pulses: 2+ and symmetric Skin: Skin color, texture, turgor normal. No rashes or lesions Neurologic: Alert and oriented X 3, normal strength and tone. Normal symmetric reflexes. Normal coordination and gait  EKG not performed today  ASSESSMENT AND PLAN:   Essential hypertension History of essential hypertension a blood pressure measured today at 138/70.  He is on metoprolol.  Paroxysmal atrial fibrillation- RFA June 2014 History of PAF status post RF ablation by Dr. [name] 09/07/2012.  He does have a loop recorder implanted 01/23/2015 which has repeatedly showed no atrial fibrillation.  He is not on oral anticoagulation.  He said no recurrent episodes of A. fib.  Dyslipidemia History of dyslipidemia on statin therapy with lipid profile performed 01/04/2020 revealing total cholesterol 122, LDL 62 and HDL 38.      Lorretta Harp MD [facility],  [facility] 07/11/2020 9:14 AM

## 2020-07-11 NOTE — Patient Instructions (Signed)

## 2020-07-19 NOTE — Progress Notes (Signed)
Carelink Summary Report / Loop Recorder 

## 2020-07-30 DIAGNOSIS — M1711 Unilateral primary osteoarthritis, right knee: Secondary | ICD-10-CM | POA: Diagnosis not present

## 2020-07-30 DIAGNOSIS — M25561 Pain in right knee: Secondary | ICD-10-CM | POA: Diagnosis not present

## 2020-07-30 DIAGNOSIS — M1712 Unilateral primary osteoarthritis, left knee: Secondary | ICD-10-CM | POA: Diagnosis not present

## 2020-07-30 DIAGNOSIS — M170 Bilateral primary osteoarthritis of knee: Secondary | ICD-10-CM | POA: Diagnosis not present

## 2020-07-30 DIAGNOSIS — M25562 Pain in left knee: Secondary | ICD-10-CM | POA: Diagnosis not present

## 2020-08-13 ENCOUNTER — Ambulatory Visit (INDEPENDENT_AMBULATORY_CARE_PROVIDER_SITE_OTHER): Payer: PPO

## 2020-08-13 DIAGNOSIS — I480 Paroxysmal atrial fibrillation: Secondary | ICD-10-CM | POA: Diagnosis not present

## 2020-08-15 LAB — CUP PACEART REMOTE DEVICE CHECK
Date Time Interrogation Session: 20220511073618
Implantable Pulse Generator Implant Date: 20200918

## 2020-08-31 NOTE — Progress Notes (Signed)
Carelink Summary Report / Loop Recorder 

## 2020-09-02 ENCOUNTER — Other Ambulatory Visit: Payer: Self-pay | Admitting: Internal Medicine

## 2020-09-12 LAB — CUP PACEART REMOTE DEVICE CHECK
Date Time Interrogation Session: 20220607230411
Implantable Pulse Generator Implant Date: 20200918

## 2020-09-17 ENCOUNTER — Ambulatory Visit (INDEPENDENT_AMBULATORY_CARE_PROVIDER_SITE_OTHER): Payer: PPO

## 2020-09-17 DIAGNOSIS — I480 Paroxysmal atrial fibrillation: Secondary | ICD-10-CM

## 2020-10-01 ENCOUNTER — Other Ambulatory Visit: Payer: Self-pay | Admitting: Cardiovascular Disease

## 2020-10-06 NOTE — Progress Notes (Signed)
Carelink Summary Report / Loop Recorder 

## 2020-10-17 LAB — CUP PACEART REMOTE DEVICE CHECK
Date Time Interrogation Session: 20220710230529
Implantable Pulse Generator Implant Date: 20200918

## 2020-10-22 ENCOUNTER — Telehealth: Payer: Self-pay

## 2020-10-22 ENCOUNTER — Ambulatory Visit (INDEPENDENT_AMBULATORY_CARE_PROVIDER_SITE_OTHER): Payer: PPO

## 2020-10-22 DIAGNOSIS — C44622 Squamous cell carcinoma of skin of right upper limb, including shoulder: Secondary | ICD-10-CM | POA: Diagnosis not present

## 2020-10-22 DIAGNOSIS — I480 Paroxysmal atrial fibrillation: Secondary | ICD-10-CM | POA: Diagnosis not present

## 2020-10-22 DIAGNOSIS — C44619 Basal cell carcinoma of skin of left upper limb, including shoulder: Secondary | ICD-10-CM | POA: Diagnosis not present

## 2020-10-22 DIAGNOSIS — L570 Actinic keratosis: Secondary | ICD-10-CM | POA: Diagnosis not present

## 2020-10-22 DIAGNOSIS — D0462 Carcinoma in situ of skin of left upper limb, including shoulder: Secondary | ICD-10-CM | POA: Diagnosis not present

## 2020-10-22 DIAGNOSIS — Z85828 Personal history of other malignant neoplasm of skin: Secondary | ICD-10-CM | POA: Diagnosis not present

## 2020-10-22 DIAGNOSIS — D485 Neoplasm of uncertain behavior of skin: Secondary | ICD-10-CM | POA: Diagnosis not present

## 2020-10-22 NOTE — Telephone Encounter (Signed)
Left voicemail [phone] on 10/22/2020 for patient to call back.  Patient needs return call.

## 2020-10-22 NOTE — Telephone Encounter (Signed)
Almon Register. [place] 75 year old male is requesting an right total knee arthroplasty.  He was last seen in my clinic on 07/11/2020.  He continued to do well at that time.  He was being followed by EP and denied episodes of breakthrough atrial fibrillation.  He denied chest pain shortness of breath.  His ILR showed no arrhythmias.  His PMH includes nonobstructive coronary artery disease, atrial fibrillation, tobacco abuse, hyperlipidemia, and family history of heart disease.  May his aspirin be held prior to his surgery?  Thank you for your help.  Please direct your response to CV DIV preop pool.  Jossie Ng. Darbie Biancardi NP-C    10/22/2020, 12:54 PM [address] Office [phone] Fax [phone]

## 2020-10-22 NOTE — Telephone Encounter (Signed)
   [facility] Pre-operative Risk Assessment    Patient Name: Luis [name]  DOB: 1946/03/11 MRN: [id number]  HEARTCARE STAFF:  - IMPORTANT!!!!!! Under Visit Info/Reason for Call, type in Other and utilize the format Clearance MM/DD/YY or Clearance TBD. Do not use dashes or single digits. - Please review there is not already an duplicate clearance open for this procedure. - If request is for dental extraction, please clarify the # of teeth to be extracted. - If the patient is currently at the dentist's office, call Pre-Op Callback Staff (MA/nurse) to input urgent request.  - If the patient is not currently in the dentist office, please route to the Pre-Op pool.  Request for surgical clearance:  What type of surgery is being performed? Right Total Knee Arthroplasty  When is this surgery scheduled? 12/04/2020  What type of clearance is required (medical clearance vs. Pharmacy clearance to hold med vs. Both)? BOTH   Are there any medications that need to be held prior to surgery and how long? Aspirin   Practice name and name of physician performing surgery? [facility]- Dr. Paralee Cancel   What is the office phone number? [phone]   7.   What is the office fax number? [phone]  8.   Anesthesia type (None, local, MAC, general) ? Unknown    Luis [name] Luis [name] 10/22/2020, 11:58 AM  _________________________________________________________________   (provider comments below)

## 2020-10-22 NOTE — Telephone Encounter (Signed)
   Primary Cardiologist: Quay Burow, MD  Chart reviewed as part of pre-operative protocol coverage. Given past medical history and time since last visit, based on ACC/AHA guidelines, Luis [name] would be at acceptable risk for the planned procedure without further cardiovascular testing.   His aspirin may be held for 5-7 days prior to his surgery.  Please resume as soon as hemostasis is achieved.  Patient was advised that if he develops new symptoms prior to surgery to contact our office to arrange a follow-up appointment.  He verbalized understanding.  I will route this recommendation to the requesting party via Epic fax function and remove from pre-op pool.  Please call with questions.  Jossie Ng. Aamari Strawderman NP-C    10/22/2020, 4:17 PM [facility] [address] Office [phone] Fax [phone]

## 2020-10-26 DIAGNOSIS — N200 Calculus of kidney: Secondary | ICD-10-CM | POA: Diagnosis not present

## 2020-11-06 DIAGNOSIS — L570 Actinic keratosis: Secondary | ICD-10-CM | POA: Diagnosis not present

## 2020-11-06 DIAGNOSIS — C44622 Squamous cell carcinoma of skin of right upper limb, including shoulder: Secondary | ICD-10-CM | POA: Diagnosis not present

## 2020-11-06 DIAGNOSIS — D485 Neoplasm of uncertain behavior of skin: Secondary | ICD-10-CM | POA: Diagnosis not present

## 2020-11-06 DIAGNOSIS — Z85828 Personal history of other malignant neoplasm of skin: Secondary | ICD-10-CM | POA: Diagnosis not present

## 2020-11-12 DIAGNOSIS — M25661 Stiffness of right knee, not elsewhere classified: Secondary | ICD-10-CM | POA: Diagnosis not present

## 2020-11-12 DIAGNOSIS — M25561 Pain in right knee: Secondary | ICD-10-CM | POA: Diagnosis not present

## 2020-11-12 DIAGNOSIS — M1711 Unilateral primary osteoarthritis, right knee: Secondary | ICD-10-CM | POA: Diagnosis not present

## 2020-11-13 NOTE — Progress Notes (Signed)
Carelink Summary Report / Loop Recorder 

## 2020-11-14 DIAGNOSIS — H43393 Other vitreous opacities, bilateral: Secondary | ICD-10-CM | POA: Diagnosis not present

## 2020-11-14 DIAGNOSIS — H2513 Age-related nuclear cataract, bilateral: Secondary | ICD-10-CM | POA: Diagnosis not present

## 2020-11-14 DIAGNOSIS — G43B0 Ophthalmoplegic migraine, not intractable: Secondary | ICD-10-CM | POA: Diagnosis not present

## 2020-11-14 DIAGNOSIS — H31092 Other chorioretinal scars, left eye: Secondary | ICD-10-CM | POA: Diagnosis not present

## 2020-11-19 DIAGNOSIS — I10 Essential (primary) hypertension: Secondary | ICD-10-CM | POA: Diagnosis not present

## 2020-11-19 DIAGNOSIS — M1711 Unilateral primary osteoarthritis, right knee: Secondary | ICD-10-CM | POA: Diagnosis not present

## 2020-11-19 DIAGNOSIS — I4891 Unspecified atrial fibrillation: Secondary | ICD-10-CM | POA: Diagnosis not present

## 2020-11-20 NOTE — Patient Instructions (Addendum)
DUE TO COVID-19 ONLY ONE VISITOR IS ALLOWED TO COME WITH YOU AND STAY IN THE WAITING ROOM ONLY DURING PRE OP AND PROCEDURE DAY OF SURGERY. THE 2 VISITORS  MAY VISIT WITH YOU AFTER SURGERY IN YOUR PRIVATE ROOM DURING VISITING HOURS ONLY!  YOU NEED TO HAVE A COVID 19 TEST ON: 11/30/20. At: [address] [place].  Phone: [phone]. Open M - F 8 am - 3 pm. It's a drive-up.               TRUSTYN EWERT   Your procedure is scheduled on: 12/04/20   Report to [facility] Main  Entrance   Report to short stay at : 5:15 AM     Call this number if you have problems the morning of surgery [phone]    Remember: NO SOLID FOOD AFTER MIDNIGHT THE NIGHT PRIOR TO SURGERY.   NOTHING BY MOUTH EXCEPT CLEAR LIQUIDS UNTIL: 4:15 AM .   PLEASE FINISH ENSURE DRINK PER SURGEON ORDER  WHICH NEEDS TO BE COMPLETED AT : 4:15 AM.   CLEAR LIQUID DIET  Foods Allowed                                                                     Foods Excluded  Coffee and tea, regular and decaf                             liquids that you cannot  Plain Jell-O any favor except red or purple                                           see through such as: Fruit ices (not with fruit pulp)                                     milk, soups, orange juice  Iced Popsicles                                    All solid food Carbonated beverages, regular and diet                                    Cranberry, grape and apple juices Sports drinks like Gatorade Lightly seasoned clear broth or consume(fat free) Sugar, honey syrup    BRUSH YOUR TEETH MORNING OF SURGERY AND RINSE YOUR MOUTH OUT, NO CHEWING GUM CANDY OR MINTS.    Take these medicines the morning of surgery with A SIP OF WATER: metoprolol,omeprazole.                               You may not have any metal on your body including              piercings  Do not wear jewelry,lotions, powders or  deodorant              Men may shave face and neck.   Do  not bring valuables to the hospital. [place].  Contacts, dentures or bridgework may not be worn into surgery.                  Please read over the following fact sheets you were given: _____________________________________________________________________           [facility] - Preparing for Surgery Before surgery, you can play an important role.  Because skin is not sterile, your skin needs to be as free of germs as possible.  You can reduce the number of germs on your skin by washing with CHG (chlorahexidine gluconate) soap before surgery.  CHG is an antiseptic cleaner which kills germs and bonds with the skin to continue killing germs even after washing. Please DO NOT use if you have an allergy to CHG or antibacterial soaps.  If your skin becomes reddened/irritated stop using the CHG and inform your nurse when you arrive at Short Stay. Do not shave (including legs and underarms) for at least 48 hours prior to the first CHG shower.  You may shave your face/neck. Please follow these instructions carefully:  1.  Shower with CHG Soap the night before surgery and the  morning of Surgery.  2.  If you choose to wash your hair, wash your hair first as usual with your  normal  shampoo.  3.  After you shampoo, rinse your hair and body thoroughly to remove the  shampoo.                                        4.  Use CHG as you would any other liquid soap.  You can apply chg directly  to the skin and wash                       Gently with a scrungie or clean washcloth.  5.  Apply the CHG Soap to your body ONLY FROM THE NECK DOWN.   Do not use on face/ open                           Wound or open sores. Avoid contact with eyes, ears mouth and genitals (private parts).                       Wash face,  Genitals (private parts) with your normal soap.             6.  Wash thoroughly, paying special attention to the area where your surgery  will be  performed.  7.  Thoroughly rinse your body with warm water from the neck down.  8.  DO NOT shower/wash with your normal soap after using and rinsing off  the CHG Soap.                9.  Pat yourself dry with a clean towel.            10.  Wear clean pajamas.            11.  Place clean sheets on your bed the  night of your first shower and do not  sleep with pets. Day of Surgery : Do not apply any lotions/deodorants the morning of surgery.  Please wear clean clothes to the hospital/surgery center.  FAILURE TO FOLLOW THESE INSTRUCTIONS MAY RESULT IN THE CANCELLATION OF YOUR SURGERY PATIENT SIGNATURE_________________________________  NURSE SIGNATURE__________________________________  ________________________________________________________________________  Adam Phenix  An incentive spirometer is a tool that can help keep your lungs clear and active. This tool measures how well you are filling your lungs with each breath. Taking long deep breaths may help reverse or decrease the chance of developing breathing (pulmonary) problems (especially infection) following: A long period of time when you are unable to move or be active. BEFORE THE PROCEDURE  If the spirometer includes an indicator to show your best effort, your nurse or respiratory therapist will set it to a desired goal. If possible, sit up straight or lean slightly forward. Try not to slouch. Hold the incentive spirometer in an upright position. INSTRUCTIONS FOR USE  Sit on the edge of your bed if possible, or sit up as far as you can in bed or on a chair. Hold the incentive spirometer in an upright position. Breathe out normally. Place the mouthpiece in your mouth and seal your lips tightly around it. Breathe in slowly and as deeply as possible, raising the piston or the ball toward the top of the column. Hold your breath for 3-5 seconds or for as long as possible. Allow the piston or ball to fall to the bottom of the  column. Remove the mouthpiece from your mouth and breathe out normally. Rest for a few seconds and repeat Steps 1 through 7 at least 10 times every 1-2 hours when you are awake. Take your time and take a few normal breaths between deep breaths. The spirometer may include an indicator to show your best effort. Use the indicator as a goal to work toward during each repetition. After each set of 10 deep breaths, practice coughing to be sure your lungs are clear. If you have an incision (the cut made at the time of surgery), support your incision when coughing by placing a pillow or rolled up towels firmly against it. Once you are able to get out of bed, walk around indoors and cough well. You may stop using the incentive spirometer when instructed by your caregiver.  RISKS AND COMPLICATIONS Take your time so you do not get dizzy or light-headed. If you are in pain, you may need to take or ask for pain medication before doing incentive spirometry. It is harder to take a deep breath if you are having pain. AFTER USE Rest and breathe slowly and easily. It can be helpful to keep track of a log of your progress. Your caregiver can provide you with a simple table to help with this. If you are using the spirometer at home, follow these instructions: [place] IF:  You are having difficultly using the spirometer. You have trouble using the spirometer as often as instructed. Your pain medication is not giving enough relief while using the spirometer. You develop fever of 100.5 F (38.1 C) or higher. SEEK IMMEDIATE MEDICAL CARE IF:  You cough up bloody sputum that had not been present before. You develop fever of 102 F (38.9 C) or greater. You develop worsening pain at or near the incision site. MAKE SURE YOU:  Understand these instructions. Will watch your condition. Will get help right away if you are not doing well or  get worse. Document Released: 08/04/2006 Document Revised: 06/16/2011  Document Reviewed: 10/05/2006 [facility] Patient Information 2014 [place], [place].   ________________________________________________________________________

## 2020-11-21 ENCOUNTER — Encounter (HOSPITAL_COMMUNITY): Payer: Self-pay

## 2020-11-21 ENCOUNTER — Other Ambulatory Visit: Payer: Self-pay

## 2020-11-21 ENCOUNTER — Encounter (HOSPITAL_COMMUNITY)
Admission: RE | Admit: 2020-11-21 | Discharge: 2020-11-21 | Disposition: A | Discharge status: Home / Self Care | Payer: PPO | Source: Ambulatory Visit | Attending: Orthopedic Surgery | Admitting: Orthopedic Surgery

## 2020-11-21 DIAGNOSIS — Z01812 Encounter for preprocedural laboratory examination: Secondary | ICD-10-CM | POA: Diagnosis not present

## 2020-11-21 DIAGNOSIS — M1711 Unilateral primary osteoarthritis, right knee: Secondary | ICD-10-CM | POA: Insufficient documentation

## 2020-11-21 HISTORY — DX: Malignant (primary) neoplasm, unspecified: C80.1

## 2020-11-21 HISTORY — DX: Atherosclerotic heart disease of native coronary artery without angina pectoris: I25.10

## 2020-11-21 LAB — COMPREHENSIVE METABOLIC PANEL
ALT: 21 U/L (ref 0–44)
AST: 24 U/L (ref 15–41)
Albumin: 4.1 g/dL (ref 3.5–5.0)
Alkaline Phosphatase: 73 U/L (ref 38–126)
Anion gap: 6 (ref 5–15)
BUN: 18 mg/dL (ref 8–23)
CO2: 28 mmol/L (ref 22–32)
Calcium: 9.9 mg/dL (ref 8.9–10.3)
Chloride: 104 mmol/L (ref 98–111)
Creatinine, Ser: 1.21 mg/dL (ref 0.61–1.24)
GFR, Estimated: >60 mL/min (ref >60)
Glucose, Bld: 110 mg/dL — ABNORMAL HIGH (ref 70–99)
Potassium: 4.9 mmol/L (ref 3.5–5.1)
Sodium: 138 mmol/L (ref 135–145)
Total Bilirubin: 1.2 mg/dL (ref 0.3–1.2)
Total Protein: 6.8 g/dL (ref 6.5–8.1)

## 2020-11-21 LAB — CBC
HCT: 50.7 % (ref 39.0–52.0)
Hemoglobin: 17.6 g/dL — ABNORMAL HIGH (ref 13.0–17.0)
MCH: 33.4 pg (ref 26.0–34.0)
MCHC: 34.7 g/dL (ref 30.0–36.0)
MCV: 96.2 fL (ref 80.0–100.0)
Platelets: 147 10*3/uL — ABNORMAL LOW (ref 150–400)
RBC: 5.27 MIL/uL (ref 4.22–5.81)
RDW: 13.1 % (ref 11.5–15.5)
WBC: 6.1 10*3/uL (ref 4.0–10.5)
nRBC: 0 % (ref 0.0–0.2)

## 2020-11-21 LAB — SURGICAL PCR SCREEN
MRSA, PCR: NEGATIVE
Staphylococcus aureus: NEGATIVE

## 2020-11-21 LAB — TYPE AND SCREEN
ABO/RH(D): A POS
Antibody Screen: NEGATIVE

## 2020-11-21 NOTE — Progress Notes (Addendum)
COVID Vaccine Completed: Yes Date COVID Vaccine completed: 07/16/19 x 2 COVID vaccine manufacturer: Pfizer     COVID Test: 11/30/20 PCP - DO: Sueanne Margarita Cardiologist - Dr. Quay Burow. LOV: 07/11/20. Clearance: Coletta Memos: NP. 10/22/20. Epic/Chart.  Chest x-ray -  EKG - 03/26/20 EPIC Stress Test -  ECHO -  Cardiac Cath -  Pacemaker/ICD device last checked:  Sleep Study -  CPAP -   Fasting Blood Sugar -  Checks Blood Sugar _____ times a day  Blood Thinner Instructions: Cardiologist. Aspirin Instructions: Can be held 5 days before Last Dose:  Anesthesia review: HTN,CAD,Afib,Loop recorder.  Patient denies shortness of breath, fever, cough and chest pain at PAT appointment   Patient verbalized understanding of instructions that were given to them at the PAT appointment. Patient was also instructed that they will need to review over the PAT instructions again at home before surgery.

## 2020-11-22 LAB — CUP PACEART REMOTE DEVICE CHECK
Date Time Interrogation Session: 20220812230333
Implantable Pulse Generator Implant Date: 20200918

## 2020-11-22 NOTE — Anesthesia Preprocedure Evaluation (Addendum)
Anesthesia Evaluation  Patient identified by MRN, date of birth, ID band Patient awake    Reviewed: Allergy & Precautions, NPO status , Patient's Chart, lab work & pertinent test results, reviewed documented beta blocker date and time   History of Anesthesia Complications (+) history of anesthetic complications (h/o aspiration event)  Airway Mallampati: II  TM Distance: >3 FB Neck ROM: Full    Dental  (+) Teeth Intact, Dental Advisory Given   Pulmonary former smoker,    Pulmonary exam normal breath sounds clear to auscultation       Cardiovascular hypertension, Pt. on home beta blockers (-) angina+ CAD  Normal cardiovascular exam+ dysrhythmias (s/p ablation) Atrial Fibrillation  Rhythm:Regular Rate:Normal     Neuro/Psych negative neurological ROS  negative psych ROS   GI/Hepatic Neg liver ROS, GERD  Medicated and Controlled,  Endo/Other  negative endocrine ROS  Renal/GU negative Renal ROS     Musculoskeletal  (+) Arthritis ,   Abdominal   Peds  Hematology  (+) Blood dyscrasia (Thrombocytopenia), ,   Anesthesia Other Findings Day of surgery medications reviewed with the patient.  Reproductive/Obstetrics                           Anesthesia Physical Anesthesia Plan  ASA: 3  Anesthesia Plan: Spinal   Post-op Pain Management:    Induction: Intravenous  PONV Risk Score and Plan: 1 and Propofol infusion, Treatment may vary due to age or medical condition, Dexamethasone and Ondansetron  Airway Management Planned: Natural Airway and Nasal Cannula  Additional Equipment:   Intra-op Plan:   Post-operative Plan:   Informed Consent: I have reviewed the patients History and Physical, chart, labs and discussed the procedure including the risks, benefits and alternatives for the proposed anesthesia with the patient or authorized representative who has indicated his/her understanding and  acceptance.     Dental advisory given  Plan Discussed with: CRNA, Anesthesiologist and Surgeon  Anesthesia Plan Comments: (See APP note by Durel Salts, FNP )       Anesthesia Quick Evaluation

## 2020-11-22 NOTE — Progress Notes (Signed)
Anesthesia Chart Review:   Case: [id number] Date/Time: 12/04/20 0700   Procedure: TOTAL KNEE ARTHROPLASTY (Right: Knee)   Anesthesia type: Spinal   Pre-op diagnosis: Right knee osteoarthritis   Location: [facility] ROOM 09 / [facility]   Surgeons: Paralee Cancel, MD       DISCUSSION: Pt is 75 years old with hx CAD (moderate D2 and PDA disease on 2008 cath), HTN, atrial fibrillation (s/p ablation 2014; no longer on anticoagulation), loop recorder  VS: BP 138/80   Pulse 65   Temp 36.6 C (Oral)   Ht '5\' 11"'$  (1.803 m)   Wt 78.5 kg   SpO2 100%   BMI 24.13 kg/m   PROVIDERS: - PCP is Sueanne Margarita, DO - Cardiologist is Quay Burow, MD. Last office visit 07/11/20. Cleared for surgery at acceptable risk 10/22/20 by Coletta Memos, NP - EP cardiologist is Thompson Grayer, MD. Last office visit 03/26/20  LABS: Labs reviewed: Acceptable for surgery. (all labs ordered are listed, but only abnormal results are displayed)  Labs Reviewed  CBC - Abnormal; Notable for the following components:      Result Value   Hemoglobin 17.6 (*)    Platelets 147 (*)    All other components within normal limits  COMPREHENSIVE METABOLIC PANEL - Abnormal; Notable for the following components:   Glucose, Bld 110 (*)    All other components within normal limits  SURGICAL PCR SCREEN  TYPE AND SCREEN    EKG 03/26/20: SR. RBBB   CV: None in last 5 years  Past Medical History:  Diagnosis Date   Allergy    SEASONAL   Arthritis    BACK AND KNEES   Cancer ([place])    Complication of anesthesia    pt states aspirated following ablation    Coronary artery disease    Dysrhythmia    GERD (gastroesophageal reflux disease)    History of kidney stones 07/2006   x 1   Hyperlipidemia    Hypertension    Normal coronary arteries 2008   Normal LV function   Paroxysmal atrial fibrillation ([place])    2008 and 20014, PVI 09/07/12    Past Surgical History:  Procedure Laterality Date   ATRIAL ABLATION SURGERY  09/07/12   PVI by  Dr [name]   ATRIAL FIBRILLATION ABLATION N/A 09/07/2012   Procedure: ATRIAL FIBRILLATION ABLATION;  Surgeon: Thompson Grayer, MD;  Location: [facility] CATH LAB;  Service: Cardiovascular;  Laterality: N/A;   CARDIAC CATHETERIZATION Left 07/20/2006   No significant CAD, EF 60%, continue medical therapy   COLONOSCOPY     CYSTOSCOPY W/ RETROGRADES Left 02/28/2019   Procedure: CYSTOSCOPY WITH RETROGRADE PYELOGRAM;  Surgeon: Robley Fries, MD;  Location: [facility];  Service: Urology;  Laterality: Left;   CYSTOSCOPY/URETEROSCOPY/HOLMIUM LASER/STENT PLACEMENT Left 03/08/2019   Procedure: CYSTOSCOPY/URETEROSCOPY/HOLMIUM LASER/STENT PLACEMENT;  Surgeon: Robley Fries, MD;  Location: [facility];  Service: Urology;  Laterality: Left;  1 HR   EP IMPLANTABLE DEVICE N/A 01/23/2015   Procedure: Loop Recorder Insertion;  Surgeon: Thompson Grayer, MD;  Location: [place] CV LAB;  Service: Cardiovascular;  Laterality: N/A;   implantable loop recorder placement  12/24/2018   previous ILR removed and a new MDT Reveal LINQ2 implanted in its place in office by Dr [name]   KNEE ARTHROSCOPY Right    TEE WITHOUT CARDIOVERSION N/A 09/06/2012   Procedure: TRANSESOPHAGEAL ECHOCARDIOGRAM (TEE);  Surgeon: Pixie Casino, MD;  Location: [facility] ENDOSCOPY;  Service: Cardiovascular;  Laterality: N/A;   UPPER GASTROINTESTINAL  ENDOSCOPY      MEDICATIONS:  aspirin EC 81 MG tablet   metoprolol tartrate (LOPRESSOR) 50 MG tablet   omeprazole (PRILOSEC) 20 MG capsule   rosuvastatin (CRESTOR) 10 MG tablet   No current facility-administered medications for this encounter.    If no changes, I anticipate pt can proceed with surgery as scheduled.   Willeen Cass, PhD, FNP-BC [facility]/Anesthesiology Phone: [phone] 11/22/2020 12:55 PM

## 2020-11-26 ENCOUNTER — Ambulatory Visit (INDEPENDENT_AMBULATORY_CARE_PROVIDER_SITE_OTHER): Payer: PPO

## 2020-11-26 DIAGNOSIS — I480 Paroxysmal atrial fibrillation: Secondary | ICD-10-CM

## 2020-11-30 ENCOUNTER — Other Ambulatory Visit: Payer: Self-pay | Admitting: Orthopedic Surgery

## 2020-12-01 LAB — SARS CORONAVIRUS 2 (TAT 6-24 HRS): SARS Coronavirus 2: NEGATIVE

## 2020-12-02 NOTE — H&P (Signed)
TOTAL KNEE ADMISSION H&P  Patient is being admitted for right total knee arthroplasty.  Subjective:  Chief Complaint:right knee pain.  HPI: Luis [name], 75 y.o. male, has a history of pain and functional disability in the right knee due to arthritis and has failed non-surgical conservative treatments for greater than 12 weeks to includecorticosteriod injections and activity modification.  Onset of symptoms was gradual, starting 2 years ago with gradually worsening course since that time. The patient noted prior procedures on the knee to include  arthroscopy and menisectomy on the right knee(s).  Patient currently rates pain in the right knee(s) at 6 out of 10 with activity. Patient has worsening of pain with activity and weight bearing and pain that interferes with activities of daily living.  Patient has evidence of joint space narrowing by imaging studies. There is no active infection.  Patient Active Problem List   Diagnosis Date Noted   Near syncope 06/06/2019   Family history of coronary artery disease in brother 06/06/2019   Status post placement of implantable loop recorder 12/24/2018   Dizziness 09/15/2013   Chronic anticoagulation 05/25/2013   Palpitations after GI illness 05/25/2013   Unspecified gastritis and gastroduodenitis without mention of hemorrhage 01/31/2013   GERD (gastroesophageal reflux disease) 01/04/2013   Hematuria 09/14/2012   CAD (coronary artery disease) 09/13/2012   Dyslipidemia 04/11/2010   Essential hypertension 04/11/2010   Paroxysmal atrial fibrillation- RFA June 2014 04/11/2010   HEMORRHOIDS 04/11/2010   STRICTURE AND STENOSIS OF ESOPHAGUS 04/11/2010   DIVERTICULAR DISEASE 04/11/2010   COLONIC POLYPS, HYPERPLASTIC, HX OF 04/11/2010   RENAL CALCULUS, HX OF 04/11/2010   ANAL FISSURE, HX OF 04/11/2010   Past Medical History:  Diagnosis Date   Allergy    SEASONAL   Arthritis    BACK AND KNEES   Cancer ([place])    Complication of anesthesia    pt  states aspirated following ablation    Coronary artery disease    Dysrhythmia    GERD (gastroesophageal reflux disease)    History of kidney stones 07/2006   x 1   Hyperlipidemia    Hypertension    Normal coronary arteries 2008   Normal LV function   Paroxysmal atrial fibrillation ([place])    2008 and 20014, PVI 09/07/12    Past Surgical History:  Procedure Laterality Date   ATRIAL ABLATION SURGERY  09/07/12   PVI by Dr [name]   ATRIAL FIBRILLATION ABLATION N/A 09/07/2012   Procedure: ATRIAL FIBRILLATION ABLATION;  Surgeon: Thompson Grayer, MD;  Location: [facility] CATH LAB;  Service: Cardiovascular;  Laterality: N/A;   CARDIAC CATHETERIZATION Left 07/20/2006   No significant CAD, EF 60%, continue medical therapy   COLONOSCOPY     CYSTOSCOPY W/ RETROGRADES Left 02/28/2019   Procedure: CYSTOSCOPY WITH RETROGRADE PYELOGRAM;  Surgeon: Robley Fries, MD;  Location: [facility];  Service: Urology;  Laterality: Left;   CYSTOSCOPY/URETEROSCOPY/HOLMIUM LASER/STENT PLACEMENT Left 03/08/2019   Procedure: CYSTOSCOPY/URETEROSCOPY/HOLMIUM LASER/STENT PLACEMENT;  Surgeon: Robley Fries, MD;  Location: [facility];  Service: Urology;  Laterality: Left;  1 HR   EP IMPLANTABLE DEVICE N/A 01/23/2015   Procedure: Loop Recorder Insertion;  Surgeon: Thompson Grayer, MD;  Location: [place] CV LAB;  Service: Cardiovascular;  Laterality: N/A;   implantable loop recorder placement  12/24/2018   previous ILR removed and a new MDT Reveal LINQ2 implanted in its place in office by Dr [name]   KNEE ARTHROSCOPY Right    TEE WITHOUT CARDIOVERSION N/A 09/06/2012  Procedure: TRANSESOPHAGEAL ECHOCARDIOGRAM (TEE);  Surgeon: Pixie Casino, MD;  Location: [facility] ENDOSCOPY;  Service: Cardiovascular;  Laterality: N/A;   UPPER GASTROINTESTINAL ENDOSCOPY      No current facility-administered medications for this encounter.   Current Outpatient Medications  Medication Sig Dispense Refill Last Dose   aspirin EC 81 MG tablet  Take 81 mg by mouth in the morning. Swallow whole.      metoprolol tartrate (LOPRESSOR) 50 MG tablet Take 1 tablet by mouth twice daily 180 tablet 3    omeprazole (PRILOSEC) 20 MG capsule Take 20 mg by mouth in the morning.      rosuvastatin (CRESTOR) 10 MG tablet Take 1 tablet (10 mg total) by mouth daily. (Patient taking differently: Take 10 mg by mouth every evening.) 90 tablet 3    No Known Allergies  Social History   Tobacco Use   Smoking status: Former    Years: 35.00    Types: Cigarettes    Quit date: 11/27/1994    Years since quitting: 26.0   Smokeless tobacco: Never  Substance Use Topics   Alcohol use: Yes    Alcohol/week: 7.0 standard drinks    Types: 7 Standard drinks or equivalent per week    Comment: 3-4 oz daily 1/2 drinks per night    Family History  Problem Relation Age of Onset   Pancreatic cancer Mother    CAD Father    Heart Problems Father    Stroke Father    Hypertension Father    Heart Problems Brother 30       Had stent put in     Review of Systems  Constitutional:  Negative for chills and fever.  Respiratory:  Negative for cough and shortness of breath.   Cardiovascular:  Negative for chest pain.  Gastrointestinal:  Negative for nausea and vomiting.  Musculoskeletal:  Positive for arthralgias.   Objective:  Physical Exam Well nourished and well developed. General: Alert and oriented x3, cooperative and pleasant, no acute distress. Head: normocephalic, atraumatic, neck supple. Eyes: EOMI.  Musculoskeletal: Right Knee: Mild tenderness to palpation about the medial joint line of the right knee. AROM 0-120 degrees. trace effusion noted. No instability. No patellofemoral crepitus.  Calves soft and nontender. Motor function intact in LE. Strength 5/5 LE bilaterally. Neuro: Distal pulses 2+. Sensation to light touch intact in LE.  Vital signs in last 24 hours:    Labs:   Estimated body mass index is 24.13 kg/m as calculated from the  following:   Height as of 11/21/20: '5\' 11"'$  (1.803 m).   Weight as of 11/21/20: 78.5 kg.   Imaging Review Plain radiographs demonstrate severe degenerative joint disease of the right knee(s). The overall alignment isneutral. The bone quality appears to be adequate for age and reported activity level.      Assessment/Plan:  End stage arthritis, right knee   The patient history, physical examination, clinical judgment of the provider and imaging studies are consistent with end stage degenerative joint disease of the right knee(s) and total knee arthroplasty is deemed medically necessary. The treatment options including medical management, injection therapy arthroscopy and arthroplasty were discussed at length. The risks and benefits of total knee arthroplasty were presented and reviewed. The risks due to aseptic loosening, infection, stiffness, patella tracking problems, thromboembolic complications and other imponderables were discussed. The patient acknowledged the explanation, agreed to proceed with the plan and consent was signed. Patient is being admitted for inpatient treatment for surgery, pain control, PT, OT, prophylactic  antibiotics, VTE prophylaxis, progressive ambulation and ADL's and discharge planning. The patient is planning to be discharged  home.  Therapy Plans: outpatient therapy at [facility] [place] Disposition: Home with wife Planned DVT Prophylaxis: aspirin '81mg'$  BID DME needed: walker PCP: Dr. Francesco Sor, appointment next week Cardiologist: Dr. Gwenlyn Found, clearance received TXA: IV Allergies: NKDA Anesthesia Concerns: none BMI: 24.3 Last HgbA1c: Not diabetic.  Other: - Fragile skin - Significant constipation issues with pain meds - Hydrocodone, robaxin, celebrex - Hx of aspirating after intubation  Patient's anticipated LOS is less than 2 midnights, meeting these requirements: - Younger than 59 - Lives within 1 hour of care - Has a competent adult at home to  recover with post-op recover - NO history of  - Chronic pain requiring opiods  - Diabetes  - Coronary Artery Disease  - Heart failure  - Heart attack  - Stroke  - DVT/VTE  - Cardiac arrhythmia  - Respiratory Failure/COPD  - Renal failure  - Anemia  - Advanced Liver disease  Griffith Citron, PA-C Orthopedic Surgery [facility] [place] [phone]

## 2020-12-04 ENCOUNTER — Other Ambulatory Visit: Payer: Self-pay

## 2020-12-04 ENCOUNTER — Ambulatory Visit (HOSPITAL_COMMUNITY): Payer: PPO | Admitting: Certified Registered Nurse Anesthetist

## 2020-12-04 ENCOUNTER — Encounter (HOSPITAL_COMMUNITY)
Admission: RE | Disposition: A | Discharge status: Home / Self Care | Payer: Self-pay | Source: Home / Self Care | Attending: Orthopedic Surgery

## 2020-12-04 ENCOUNTER — Encounter (HOSPITAL_COMMUNITY): Payer: Self-pay | Admitting: Orthopedic Surgery

## 2020-12-04 ENCOUNTER — Observation Stay (HOSPITAL_COMMUNITY)
Admission: RE | Admit: 2020-12-04 | Discharge: 2020-12-05 | Disposition: A | Discharge status: Home / Self Care | Payer: PPO | Attending: Orthopedic Surgery | Admitting: Orthopedic Surgery

## 2020-12-04 ENCOUNTER — Ambulatory Visit (HOSPITAL_COMMUNITY): Payer: PPO | Admitting: Emergency Medicine

## 2020-12-04 DIAGNOSIS — I480 Paroxysmal atrial fibrillation: Secondary | ICD-10-CM | POA: Diagnosis not present

## 2020-12-04 DIAGNOSIS — Z859 Personal history of malignant neoplasm, unspecified: Secondary | ICD-10-CM | POA: Diagnosis not present

## 2020-12-04 DIAGNOSIS — Z87891 Personal history of nicotine dependence: Secondary | ICD-10-CM | POA: Insufficient documentation

## 2020-12-04 DIAGNOSIS — Z96651 Presence of right artificial knee joint: Secondary | ICD-10-CM

## 2020-12-04 DIAGNOSIS — Z7982 Long term (current) use of aspirin: Secondary | ICD-10-CM | POA: Diagnosis not present

## 2020-12-04 DIAGNOSIS — M1711 Unilateral primary osteoarthritis, right knee: Principal | ICD-10-CM | POA: Insufficient documentation

## 2020-12-04 DIAGNOSIS — G8918 Other acute postprocedural pain: Secondary | ICD-10-CM | POA: Diagnosis not present

## 2020-12-04 DIAGNOSIS — Z79899 Other long term (current) drug therapy: Secondary | ICD-10-CM | POA: Diagnosis not present

## 2020-12-04 DIAGNOSIS — I10 Essential (primary) hypertension: Secondary | ICD-10-CM | POA: Insufficient documentation

## 2020-12-04 DIAGNOSIS — I2510 Atherosclerotic heart disease of native coronary artery without angina pectoris: Secondary | ICD-10-CM | POA: Insufficient documentation

## 2020-12-04 HISTORY — PX: TOTAL KNEE ARTHROPLASTY: SHX125

## 2020-12-04 LAB — ABO/RH: ABO/RH(D): A POS

## 2020-12-04 SURGERY — ARTHROPLASTY, KNEE, TOTAL
Anesthesia: Spinal | Site: Knee | Laterality: Right

## 2020-12-04 MED ORDER — SODIUM CHLORIDE (PF) 0.9 % IJ SOLN
INTRAMUSCULAR | Status: DC | PRN
  Administered 2020-12-04: 30 mL

## 2020-12-04 MED ORDER — DEXAMETHASONE SODIUM PHOSPHATE 10 MG/ML IJ SOLN
INTRAMUSCULAR | Status: AC
  Filled 2020-12-04: qty 1

## 2020-12-04 MED ORDER — EPHEDRINE SULFATE-NACL 50-0.9 MG/10ML-% IV SOSY
PREFILLED_SYRINGE | INTRAVENOUS | Status: DC | PRN
  Administered 2020-12-04 (×2): 10 mg via INTRAVENOUS

## 2020-12-04 MED ORDER — ACETAMINOPHEN 500 MG PO TABS
1000.0000 mg | ORAL_TABLET | Freq: Once | ORAL | Status: AC
  Administered 2020-12-04: 1000 mg via ORAL
  Filled 2020-12-04: qty 2

## 2020-12-04 MED ORDER — METOCLOPRAMIDE HCL 5 MG/ML IJ SOLN: 5.0000 mg | Freq: Three times a day (TID) | INTRAMUSCULAR | Status: DC | PRN

## 2020-12-04 MED ORDER — PANTOPRAZOLE SODIUM 40 MG PO TBEC
40.0000 mg | DELAYED_RELEASE_TABLET | Freq: Every day | ORAL | Status: DC
  Administered 2020-12-05: 40 mg via ORAL
  Filled 2020-12-04: qty 1

## 2020-12-04 MED ORDER — ALUM & MAG HYDROXIDE-SIMETH 200-200-20 MG/5ML PO SUSP
30.0000 mL | Freq: Four times a day (QID) | ORAL | Status: DC | PRN
  Filled 2020-12-04: qty 30

## 2020-12-04 MED ORDER — BUPIVACAINE IN DEXTROSE 0.75-8.25 % IT SOLN
INTRATHECAL | Status: DC | PRN
  Administered 2020-12-04: 1.8 mL via INTRATHECAL

## 2020-12-04 MED ORDER — CEFAZOLIN SODIUM-DEXTROSE 2-4 GM/100ML-% IV SOLN
2.0000 g | INTRAVENOUS | Status: AC
  Administered 2020-12-04: 2 g via INTRAVENOUS
  Filled 2020-12-04: qty 100

## 2020-12-04 MED ORDER — KETOROLAC TROMETHAMINE 30 MG/ML IJ SOLN
INTRAMUSCULAR | Status: DC | PRN
  Administered 2020-12-04: 30 mg

## 2020-12-04 MED ORDER — POVIDONE-IODINE 10 % EX SWAB
2.0000 "application " | Freq: Once | CUTANEOUS | Status: AC
  Administered 2020-12-04: 2 via TOPICAL

## 2020-12-04 MED ORDER — ONDANSETRON HCL 4 MG/2ML IJ SOLN: 4.0000 mg | Freq: Once | INTRAMUSCULAR | Status: DC | PRN

## 2020-12-04 MED ORDER — FAMOTIDINE 20 MG PO TABS
20.0000 mg | ORAL_TABLET | Freq: Once | ORAL | Status: AC
  Administered 2020-12-04: 20 mg via ORAL
  Filled 2020-12-04: qty 1

## 2020-12-04 MED ORDER — BUPIVACAINE IN DEXTROSE 0.75-8.25 % IT SOLN: INTRATHECAL | Status: DC | PRN

## 2020-12-04 MED ORDER — DOCUSATE SODIUM 100 MG PO CAPS
100.0000 mg | ORAL_CAPSULE | Freq: Two times a day (BID) | ORAL | Status: DC
  Administered 2020-12-04 – 2020-12-05 (×3): 100 mg via ORAL
  Filled 2020-12-04 (×3): qty 1

## 2020-12-04 MED ORDER — PROPOFOL 1000 MG/100ML IV EMUL
INTRAVENOUS | Status: AC
  Filled 2020-12-04: qty 100

## 2020-12-04 MED ORDER — HYDROCODONE-ACETAMINOPHEN 5-325 MG PO TABS
1.0000 | ORAL_TABLET | ORAL | Status: DC | PRN
  Administered 2020-12-04 – 2020-12-05 (×3): 2 via ORAL
  Filled 2020-12-04 (×5): qty 2

## 2020-12-04 MED ORDER — ORAL CARE MOUTH RINSE: 15.0000 mL | Freq: Once | OROMUCOSAL | Status: AC

## 2020-12-04 MED ORDER — TRANEXAMIC ACID-NACL 1000-0.7 MG/100ML-% IV SOLN
1000.0000 mg | INTRAVENOUS | Status: AC
  Administered 2020-12-04: 1000 mg via INTRAVENOUS
  Filled 2020-12-04: qty 100

## 2020-12-04 MED ORDER — ONDANSETRON HCL 4 MG/2ML IJ SOLN
INTRAMUSCULAR | Status: DC | PRN
  Administered 2020-12-04: 4 mg via INTRAVENOUS

## 2020-12-04 MED ORDER — PROPOFOL 500 MG/50ML IV EMUL
INTRAVENOUS | Status: DC | PRN
  Administered 2020-12-04: 50 ug/kg/min via INTRAVENOUS

## 2020-12-04 MED ORDER — PHENOL 1.4 % MT LIQD: 1.0000 | OROMUCOSAL | Status: DC | PRN

## 2020-12-04 MED ORDER — CHLORHEXIDINE GLUCONATE 0.12 % MT SOLN
15.0000 mL | Freq: Once | OROMUCOSAL | Status: AC
  Administered 2020-12-04: 15 mL via OROMUCOSAL

## 2020-12-04 MED ORDER — MORPHINE SULFATE (PF) 2 MG/ML IV SOLN: 0.5000 mg | INTRAVENOUS | Status: DC | PRN

## 2020-12-04 MED ORDER — KETOROLAC TROMETHAMINE 30 MG/ML IJ SOLN
INTRAMUSCULAR | Status: AC
  Filled 2020-12-04: qty 1

## 2020-12-04 MED ORDER — DEXAMETHASONE SODIUM PHOSPHATE 10 MG/ML IJ SOLN
8.0000 mg | Freq: Once | INTRAMUSCULAR | Status: AC
  Administered 2020-12-04: 8 mg via INTRAVENOUS

## 2020-12-04 MED ORDER — BUPIVACAINE-EPINEPHRINE (PF) 0.25% -1:200000 IJ SOLN
INTRAMUSCULAR | Status: DC | PRN
  Administered 2020-12-04: 30 mL

## 2020-12-04 MED ORDER — STERILE WATER FOR IRRIGATION IR SOLN
Status: DC | PRN
  Administered 2020-12-04: 2000 mL

## 2020-12-04 MED ORDER — ROSUVASTATIN CALCIUM 10 MG PO TABS
10.0000 mg | ORAL_TABLET | Freq: Every evening | ORAL | Status: DC
  Administered 2020-12-04: 10 mg via ORAL
  Filled 2020-12-04: qty 1

## 2020-12-04 MED ORDER — SODIUM CHLORIDE 0.9 % IV SOLN: INTRAVENOUS | Status: DC

## 2020-12-04 MED ORDER — POLYETHYLENE GLYCOL 3350 17 G PO PACK: 17.0000 g | PACK | Freq: Every day | ORAL | Status: DC | PRN

## 2020-12-04 MED ORDER — BUPIVACAINE-EPINEPHRINE (PF) 0.25% -1:200000 IJ SOLN
INTRAMUSCULAR | Status: AC
  Filled 2020-12-04: qty 30

## 2020-12-04 MED ORDER — FERROUS SULFATE 325 (65 FE) MG PO TABS
325.0000 mg | ORAL_TABLET | Freq: Three times a day (TID) | ORAL | Status: DC
  Administered 2020-12-04 – 2020-12-05 (×3): 325 mg via ORAL
  Filled 2020-12-04 (×3): qty 1

## 2020-12-04 MED ORDER — PROPOFOL 10 MG/ML IV BOLUS
INTRAVENOUS | Status: AC
  Filled 2020-12-04: qty 20

## 2020-12-04 MED ORDER — ACETAMINOPHEN 325 MG PO TABS: 325.0000 mg | ORAL_TABLET | Freq: Four times a day (QID) | ORAL | Status: DC | PRN

## 2020-12-04 MED ORDER — SODIUM CHLORIDE (PF) 0.9 % IJ SOLN
INTRAMUSCULAR | Status: AC
  Filled 2020-12-04: qty 30

## 2020-12-04 MED ORDER — FENTANYL CITRATE (PF) 100 MCG/2ML IJ SOLN
INTRAMUSCULAR | Status: AC
  Filled 2020-12-04: qty 2

## 2020-12-04 MED ORDER — ASPIRIN 81 MG PO CHEW
81.0000 mg | CHEWABLE_TABLET | Freq: Two times a day (BID) | ORAL | Status: DC
  Administered 2020-12-04 – 2020-12-05 (×2): 81 mg via ORAL
  Filled 2020-12-04 (×2): qty 1

## 2020-12-04 MED ORDER — ONDANSETRON HCL 4 MG/2ML IJ SOLN: 4.0000 mg | Freq: Four times a day (QID) | INTRAMUSCULAR | Status: DC | PRN

## 2020-12-04 MED ORDER — ROPIVACAINE HCL 5 MG/ML IJ SOLN
INTRAMUSCULAR | Status: DC | PRN
  Administered 2020-12-04: 30 mL via PERINEURAL

## 2020-12-04 MED ORDER — CEFAZOLIN SODIUM-DEXTROSE 2-4 GM/100ML-% IV SOLN
2.0000 g | Freq: Four times a day (QID) | INTRAVENOUS | Status: AC
Start: 2020-12-04 — End: 2020-12-04
  Administered 2020-12-04 (×2): 2 g via INTRAVENOUS
  Filled 2020-12-04 (×2): qty 100

## 2020-12-04 MED ORDER — DEXAMETHASONE SODIUM PHOSPHATE 10 MG/ML IJ SOLN
10.0000 mg | Freq: Once | INTRAMUSCULAR | Status: AC
  Administered 2020-12-05: 10 mg via INTRAVENOUS
  Filled 2020-12-04: qty 1

## 2020-12-04 MED ORDER — TRANEXAMIC ACID-NACL 1000-0.7 MG/100ML-% IV SOLN
1000.0000 mg | Freq: Once | INTRAVENOUS | Status: AC
  Administered 2020-12-04: 1000 mg via INTRAVENOUS
  Filled 2020-12-04: qty 100

## 2020-12-04 MED ORDER — FENTANYL CITRATE (PF) 100 MCG/2ML IJ SOLN
INTRAMUSCULAR | Status: DC | PRN
  Administered 2020-12-04 (×2): 50 ug via INTRAVENOUS

## 2020-12-04 MED ORDER — BISACODYL 10 MG RE SUPP: 10.0000 mg | Freq: Every day | RECTAL | Status: DC | PRN

## 2020-12-04 MED ORDER — EPHEDRINE 5 MG/ML INJ
INTRAVENOUS | Status: AC
  Filled 2020-12-04: qty 5

## 2020-12-04 MED ORDER — METOCLOPRAMIDE HCL 5 MG PO TABS: 5.0000 mg | ORAL_TABLET | Freq: Three times a day (TID) | ORAL | Status: DC | PRN

## 2020-12-04 MED ORDER — METHOCARBAMOL 500 MG PO TABS: 500.0000 mg | ORAL_TABLET | Freq: Four times a day (QID) | ORAL | Status: DC | PRN

## 2020-12-04 MED ORDER — MENTHOL 3 MG MT LOZG: 1.0000 | LOZENGE | OROMUCOSAL | Status: DC | PRN

## 2020-12-04 MED ORDER — CLONIDINE HCL (ANALGESIA) 100 MCG/ML EP SOLN
EPIDURAL | Status: DC | PRN
  Administered 2020-12-04: 50 ug

## 2020-12-04 MED ORDER — ONDANSETRON HCL 4 MG/2ML IJ SOLN
INTRAMUSCULAR | Status: AC
  Filled 2020-12-04: qty 2

## 2020-12-04 MED ORDER — METOPROLOL TARTRATE 50 MG PO TABS
50.0000 mg | ORAL_TABLET | Freq: Two times a day (BID) | ORAL | Status: DC
  Administered 2020-12-05: 50 mg via ORAL
  Filled 2020-12-04 (×2): qty 1

## 2020-12-04 MED ORDER — LACTATED RINGERS IV SOLN: INTRAVENOUS | Status: DC

## 2020-12-04 MED ORDER — ONDANSETRON HCL 4 MG PO TABS: 4.0000 mg | ORAL_TABLET | Freq: Four times a day (QID) | ORAL | Status: DC | PRN

## 2020-12-04 MED ORDER — DIPHENHYDRAMINE HCL 12.5 MG/5ML PO ELIX: 12.5000 mg | ORAL_SOLUTION | ORAL | Status: DC | PRN

## 2020-12-04 MED ORDER — METHOCARBAMOL 1000 MG/10ML IJ SOLN
500.0000 mg | Freq: Four times a day (QID) | INTRAVENOUS | Status: DC | PRN
  Administered 2020-12-04: 500 mg via INTRAVENOUS
  Filled 2020-12-04 (×2): qty 5

## 2020-12-04 MED ORDER — CELECOXIB 200 MG PO CAPS
200.0000 mg | ORAL_CAPSULE | Freq: Two times a day (BID) | ORAL | Status: DC
  Administered 2020-12-04 (×2): 200 mg via ORAL
  Filled 2020-12-04 (×2): qty 1

## 2020-12-04 MED ORDER — FENTANYL CITRATE PF 50 MCG/ML IJ SOSY: 25.0000 ug | PREFILLED_SYRINGE | INTRAMUSCULAR | Status: DC | PRN

## 2020-12-04 MED ORDER — HYDROCODONE-ACETAMINOPHEN 7.5-325 MG PO TABS: 1.0000 | ORAL_TABLET | ORAL | Status: DC | PRN

## 2020-12-04 SURGICAL SUPPLY — 59 items
ADH SKN CLS APL DERMABOND .7 (GAUZE/BANDAGES/DRESSINGS) ×1
ATTUNE MED ANAT PAT 41 KNEE (Knees) ×1 IMPLANT
ATTUNE MED ANAT PAT 41MM KNEE (Knees) ×1 IMPLANT
ATTUNE PS FEM RT SZ 7 CEM KNEE (Femur) ×3 IMPLANT
ATTUNE PSRP INSR SZ7 6 KNEE (Insert) ×1 IMPLANT
ATTUNE PSRP INSR SZ7 6MM KNEE (Insert) ×1 IMPLANT
BAG COUNTER SPONGE SURGICOUNT (BAG) IMPLANT
BAG SPEC THK2 15X12 ZIP CLS (MISCELLANEOUS)
BAG SPNG CNTER NS LX DISP (BAG)
BAG SURGICOUNT SPONGE COUNTING (BAG)
BAG ZIPLOCK 12X15 (MISCELLANEOUS) IMPLANT
BASE TIBIAL ROT PLAT SZ 8 KNEE (Knees) IMPLANT
BLADE SAW SGTL 11.0X1.19X90.0M (BLADE) IMPLANT
BLADE SAW SGTL 13.0X1.19X90.0M (BLADE) ×3 IMPLANT
BLADE SURG SZ10 CARB STEEL (BLADE) ×6 IMPLANT
BNDG ELASTIC 6X5.8 VLCR STR LF (GAUZE/BANDAGES/DRESSINGS) ×3 IMPLANT
BOWL SMART MIX CTS (DISPOSABLE) ×3 IMPLANT
BSPLAT TIB 8 CMNT ROT PLAT STR (Knees) ×1 IMPLANT
CEMENT HV SMART SET (Cement) ×4 IMPLANT
CUFF TOURN SGL QUICK 34 (TOURNIQUET CUFF) ×3
CUFF TRNQT CYL 34X4.125X (TOURNIQUET CUFF) ×1 IMPLANT
DECANTER SPIKE VIAL GLASS SM (MISCELLANEOUS) ×6 IMPLANT
DERMABOND ADVANCED (GAUZE/BANDAGES/DRESSINGS) ×2
DERMABOND ADVANCED .7 DNX12 (GAUZE/BANDAGES/DRESSINGS) ×1 IMPLANT
DRAPE U-SHAPE 47X51 STRL (DRAPES) ×3 IMPLANT
DRESSING AQUACEL AG SP 3.5X10 (GAUZE/BANDAGES/DRESSINGS) ×1 IMPLANT
DRSG AQUACEL AG SP 3.5X10 (GAUZE/BANDAGES/DRESSINGS) ×3
DURAPREP 26ML APPLICATOR (WOUND CARE) ×6 IMPLANT
ELECT REM PT RETURN 15FT ADLT (MISCELLANEOUS) ×3 IMPLANT
GLOVE SURG ENC MOIS LTX SZ6 (GLOVE) IMPLANT
GLOVE SURG UNDER LTX SZ7.5 (GLOVE) ×3 IMPLANT
GLOVE SURG UNDER POLY LF SZ6.5 (GLOVE) IMPLANT
GLOVE SURG UNDER POLY LF SZ7.5 (GLOVE) ×3 IMPLANT
GOWN STRL REUS W/TWL LRG LVL3 (GOWN DISPOSABLE) ×3 IMPLANT
HANDPIECE INTERPULSE COAX TIP (DISPOSABLE) ×3
HOLDER FOLEY CATH W/STRAP (MISCELLANEOUS) IMPLANT
KIT TURNOVER KIT A (KITS) ×3 IMPLANT
MANIFOLD NEPTUNE II (INSTRUMENTS) ×3 IMPLANT
NDL SAFETY ECLIPSE 18X1.5 (NEEDLE) IMPLANT
NEEDLE HYPO 18GX1.5 SHARP (NEEDLE)
NS IRRIG 1000ML POUR BTL (IV SOLUTION) ×3 IMPLANT
PACK TOTAL KNEE CUSTOM (KITS) ×3 IMPLANT
PENCIL SMOKE EVACUATOR (MISCELLANEOUS) IMPLANT
PIN DRILL FIX HALF THREAD (BIT) ×2 IMPLANT
PIN FIX SIGMA LCS THRD HI (PIN) ×2 IMPLANT
PROTECTOR NERVE ULNAR (MISCELLANEOUS) ×3 IMPLANT
SET HNDPC FAN SPRY TIP SCT (DISPOSABLE) ×1 IMPLANT
SET PAD KNEE POSITIONER (MISCELLANEOUS) ×3 IMPLANT
SUT MNCRL AB 4-0 PS2 18 (SUTURE) ×3 IMPLANT
SUT STRATAFIX PDS+ 0 24IN (SUTURE) ×3 IMPLANT
SUT VIC AB 1 CT1 36 (SUTURE) ×3 IMPLANT
SUT VIC AB 2-0 CT1 27 (SUTURE) ×9
SUT VIC AB 2-0 CT1 TAPERPNT 27 (SUTURE) ×3 IMPLANT
SYR 3ML LL SCALE MARK (SYRINGE) ×3 IMPLANT
TIBIAL BASE ROT PLAT SZ 8 KNEE (Knees) ×3 IMPLANT
TRAY FOLEY MTR SLVR 16FR STAT (SET/KITS/TRAYS/PACK) ×3 IMPLANT
TUBE SUCTION HIGH CAP CLEAR NV (SUCTIONS) ×3 IMPLANT
WATER STERILE IRR 1000ML POUR (IV SOLUTION) ×6 IMPLANT
WRAP KNEE MAXI GEL POST OP (GAUZE/BANDAGES/DRESSINGS) ×3 IMPLANT

## 2020-12-04 NOTE — Op Note (Signed)
NAME:  Luis [name] RECORD NO.:  [id number]                             FACILITY:  [facility]      PHYSICIAN:  Pietro Cassis. Alvan Dame, M.D.  DATE OF BIRTH:  [birth year]      DATE OF PROCEDURE:  12/04/2020                                     OPERATIVE REPORT         PREOPERATIVE DIAGNOSIS:  Right knee osteoarthritis.      POSTOPERATIVE DIAGNOSIS:  Right knee osteoarthritis.      FINDINGS:  The patient was noted to have complete loss of cartilage and   bone-on-bone arthritis with associated osteophytes in the medial and patellofemoral compartments of   the knee with a significant synovitis and associated effusion.  The patient had failed months of conservative treatment including medications, injection therapy, activity modification.     PROCEDURE:  Right total knee replacement.      COMPONENTS USED:  DePuy Attune rotating platform posterior stabilized knee   system, a size 7 femur, 8 tibia, size 6 mm PS AOX insert, and 41 anatomic patellar   button.      SURGEON:  Pietro Cassis. Alvan Dame, M.D.      ASSISTANT:  Costella Hatcher, PA-C.      ANESTHESIA:  Regional and Spinal.      SPECIMENS:  None.      COMPLICATION:  None.      DRAINS:  None.  EBL: <50 cc      TOURNIQUET TIME:   Total Tourniquet Time Documented: Thigh (Right) - 31 minutes Total: Thigh (Right) - 31 minutes  .      The patient was stable to the recovery room.      INDICATION FOR PROCEDURE:  Luis [name] is a 75 y.o. male patient of   mine.  The patient had been seen, evaluated, and treated for months conservatively in the   office with medication, activity modification, and injections.  The patient had   radiographic changes of bone-on-bone arthritis with endplate sclerosis and osteophytes noted.  Based on the radiographic changes and failed conservative measures, the patient   decided to proceed with definitive treatment, total knee replacement.  Risks of infection, DVT, component failure,  need for revision surgery, neurovascular injury were reviewed in the office setting.  The postop course was reviewed stressing the efforts to maximize post-operative satisfaction and function.  Consent was obtained for benefit of pain   relief.      PROCEDURE IN DETAIL:  The patient was brought to the operative theater.   Once adequate anesthesia, preoperative antibiotics, 2 gm of Ancef,1 gm of Tranexamic Acid, and 10 mg of Decadron administered, the patient was positioned supine with a right thigh tourniquet placed.  The  right lower extremity was prepped and draped in sterile fashion.  A time-   out was performed identifying the patient, planned procedure, and the appropriate extremity.      The right lower extremity was placed in the [facility] leg holder.  The leg was   exsanguinated, tourniquet elevated to 250 mmHg.  A midline incision was  made followed by median parapatellar arthrotomy.  Following initial   exposure, attention was first directed to the patella.  Precut   measurement was noted to be 25 mm.  I resected down to 14 mm and used a   41 anatomic patellar button to restore patellar height as well as cover the cut surface.      The lug holes were drilled and a metal shim was placed to protect the   patella from retractors and saw blade during the procedure.      At this point, attention was now directed to the femur.  The femoral   canal was opened with a drill, irrigated to try to prevent fat emboli.  An   intramedullary rod was passed at 3 degrees valgus, 10 mm of bone was   resected off the distal femur due to preoperative flexion contracture.  Following this resection, the tibia was   subluxated anteriorly.  Using the extramedullary guide, 2 mm of bone was resected off   the proximal medial tibia.  We confirmed the gap would be   stable medially and laterally with a size 5 spacer block as well as confirmed that the tibial cut was perpendicular in the coronal plane, checking  with an alignment rod.      Once this was done, I sized the femur to be a size 7 in the anterior-   posterior dimension, chose a standard component based on medial and   lateral dimension.  The size 7 rotation block was then pinned in   position anterior referenced using the C-clamp to set rotation.  The   anterior, posterior, and  chamfer cuts were made without difficulty nor   notching making certain that I was along the anterior cortex to help   with flexion gap stability.      The final box cut was made off the lateral aspect of distal femur.      At this point, the tibia was sized to be a size 8.  The size 8 tray was   then pinned in position through the medial third of the tubercle,   drilled, and keel punched.  Trial reduction was now carried with a 7 femur,  8 tibia, a size 6 mm PS insert, and the 41 anatomic patella botton.  The knee was brought to full extension with good flexion stability with the patella   tracking through the trochlea without application of pressure.  Given   all these findings the trial components removed.  Final components were   opened and cement was mixed.  The knee was irrigated with normal saline solution and pulse lavage.  The synovial lining was   then injected with 30 cc of 0.25% Marcaine with epinephrine, 1 cc of Toradol and 30 cc of NS for a total of 61 cc.     Final implants were then cemented onto cleaned and dried cut surfaces of bone with the knee brought to extension with a size 6 mm PS trial insert.      Once the cement had fully cured, excess cement was removed   throughout the knee.  I confirmed that I was satisfied with the range of   motion and stability, and the final size 6 mm PS AOX insert was chosen.  It was   placed into the knee.      The tourniquet had been let down at 31 minutes.  No significant   hemostasis was required.  The extensor mechanism was  then reapproximated using #1 Vicryl and #1 Stratafix sutures with the knee   in  flexion.  The   remaining wound was closed with 2-0 Vicryl and running 4-0 Monocryl.   The knee was cleaned, dried, dressed sterilely using Dermabond and   Aquacel dressing.  The patient was then   brought to recovery room in stable condition, tolerating the procedure   well.   Please note that Physician Assistant, Costella Hatcher, PA-C was present for the entirety of the case, and was utilized for pre-operative positioning, peri-operative retractor management, general facilitation of the procedure and for primary wound closure at the end of the case.              Pietro Cassis Alvan Dame, M.D.    12/04/2020 8:33 AM

## 2020-12-04 NOTE — Discharge Instructions (Signed)

## 2020-12-04 NOTE — Care Plan (Signed)
Ortho Bundle Case Management Note  Patient Details  Name: EDGEL VI MRN: [id number] Date of Birth: [birth year]                  R TKA on 12/04/20. DCP: Home with wife. 2 story home with 3 steps. DME: RW ordered through [place] [facility]. Doesn't want 3in1. PT: Quinn Plowman 9/2   DME Arranged:  [place] Rile rolling DME Agency:  [employer]  HH Arranged:    [place] Agency:     Additional Comments: Please contact me with any questions of if this plan should need to change.  Marianne Sofia, RN,CCM [facility]  [phone] 12/04/2020, 10:42 AM

## 2020-12-04 NOTE — Anesthesia Procedure Notes (Signed)
Anesthesia Regional Block: Adductor canal block   Pre-Anesthetic Checklist: , timeout performed,  Correct Patient, Correct Site, Correct Laterality,  Correct Procedure, Correct Position, site marked,  Risks and benefits discussed,  Surgical consent,  Pre-op evaluation,  At surgeon's request and post-op pain management  Laterality: Right  Prep: chloraprep       Needles:  Injection technique: Single-shot  Needle Type: Echogenic Needle     Needle Length: 9cm  Needle Gauge: 21     Additional Needles:   Procedures:,,,, ultrasound used (permanent image in chart),,    Narrative:  Start time: 12/04/2020 7:00 AM End time: 12/04/2020 7:07 AM Injection made incrementally with aspirations every 5 mL.  Performed by: Personally  Anesthesiologist: Catalina Gravel, MD  Additional Notes: No pain on injection. No increased resistance to injection. Injection made in 5cc increments.  Good needle visualization.  Patient tolerated procedure well.

## 2020-12-04 NOTE — Anesthesia Postprocedure Evaluation (Signed)
Anesthesia Post Note  Patient: Luis [name]  Procedure(s) Performed: TOTAL KNEE ARTHROPLASTY (Right: Knee)     Patient location during evaluation: PACU Anesthesia Type: Spinal Level of consciousness: oriented, awake and alert and awake Pain management: pain level controlled Vital Signs Assessment: post-procedure vital signs reviewed and stable Respiratory status: spontaneous breathing, respiratory function stable, patient connected to nasal cannula oxygen and nonlabored ventilation Cardiovascular status: blood pressure returned to baseline and stable Postop Assessment: no headache, no backache, no apparent nausea or vomiting, patient able to bend at knees and spinal receding Anesthetic complications: no   No notable events documented.  Last Vitals:  Vitals:   12/04/20 1135 12/04/20 1351  BP: 122/70 (!) 116/58  Pulse: 73 63  Resp: 16 16  Temp: (!) 36.4 C 36.6 C  SpO2: 100% 99%    Last Pain:  Vitals:   12/04/20 1438  TempSrc:   PainSc: [place]

## 2020-12-04 NOTE — Transfer of Care (Signed)
Immediate Anesthesia Transfer of Care Note  Patient: Luis [name]  Procedure(s) Performed: TOTAL KNEE ARTHROPLASTY (Right: Knee)  Patient Location: PACU  Anesthesia Type:Spinal and MAC combined with regional for post-op pain  Level of Consciousness: awake, alert , oriented and patient cooperative  Airway & Oxygen Therapy: Patient Spontanous Breathing  Post-op Assessment: Report given to RN and Post -op Vital signs reviewed and stable  Post vital signs: Reviewed and stable  Last Vitals:  Vitals Value Taken Time  BP 105/61 12/04/20 0900  Temp    Pulse    Resp 15 12/04/20 0901  SpO2    Vitals shown include unvalidated device data.  Last Pain:  Vitals:   12/04/20 0554  TempSrc: Oral         Complications: No notable events documented.

## 2020-12-04 NOTE — Anesthesia Procedure Notes (Signed)
Spinal  Patient location during procedure: OR Start time: 12/04/2020 7:15 AM End time: 12/04/2020 7:18 AM Reason for block: surgical anesthesia Staffing Performed: anesthesiologist  Anesthesiologist: Catalina Gravel, MD Preanesthetic Checklist Completed: patient identified, IV checked, risks and benefits discussed, surgical consent, monitors and equipment checked, pre-op evaluation and timeout performed Spinal Block Patient position: sitting Prep: DuraPrep and site prepped and draped Patient monitoring: continuous pulse ox and blood pressure Approach: midline Location: L3-4 Injection technique: single-shot Needle Needle type: Pencan  Needle gauge: 24 G Assessment Events: CSF return Additional Notes Functioning IV was confirmed and monitors were applied. Sterile prep and drape, including hand hygiene, mask and sterile gloves were used. The patient was positioned and the spine was prepped. The skin was anesthetized with lidocaine.  Free flow of clear CSF was obtained prior to injecting local anesthetic into the CSF.  The spinal needle aspirated freely following injection.  The needle was carefully withdrawn.  The patient tolerated the procedure well. Consent was obtained prior to procedure with all questions answered and concerns addressed. Risks including but not limited to bleeding, infection, nerve damage, paralysis, failed block, inadequate analgesia, allergic reaction, high spinal, itching and headache were discussed and the patient wished to proceed.   Hoy Morn, MD

## 2020-12-04 NOTE — Evaluation (Signed)
Physical Therapy Evaluation Patient Details Name: Luis [name] MRN: [id number] DOB: May 18, 1945 Today's Date: 12/04/2020   History of Present Illness  75 y.o. male admitted 12/04/2020 for R TKA.  Clinical Impression  Pt is s/p TKA resulting in the deficits listed below (see PT Problem List). Pt ambulated 67' with RW, no loss of balance. Initiated TKA HEP. Good progress expected.  Pt will benefit from skilled PT to increase their independence and safety with mobility to allow discharge to the venue listed below.      Follow Up Recommendations Follow surgeon's recommendation for DC plan and follow-up therapies;Outpatient PT    Equipment Recommendations  Rolling walker with 5" wheels    Recommendations for Other Services       Precautions / Restrictions Precautions Precautions: Fall;Knee Precaution Booklet Issued: Yes (comment) Precaution Comments: reviewed no pillow under knee, denies falls in past 1 year though R knee has given out a few times Restrictions Weight Bearing Restrictions: No Other Position/Activity Restrictions: WBAT RLE      Mobility  Bed Mobility Overal bed mobility: Needs Assistance Bed Mobility: Supine to Sit     Supine to sit: Min assist;HOB elevated     General bed mobility comments: assist to advance RLE, used rail    Transfers Overall transfer level: Needs assistance Equipment used: Rolling walker (2 wheeled) Transfers: Sit to/from Stand Sit to Stand: Min assist         General transfer comment: VCs hand placement, min A to power up  Ambulation/Gait Ambulation/Gait assistance: Min guard Gait Distance (Feet): 55 Feet Assistive device: Rolling walker (2 wheeled) Gait Pattern/deviations: Step-to pattern Gait velocity: decr   General Gait Details: VCs sequencing, no loss of balance  Stairs            Wheelchair Mobility    Modified Rankin (Stroke Patients Only)       Balance Overall balance assessment: Modified  Independent                                           Pertinent Vitals/Pain Pain Assessment: No/denies pain    Home Living Family/patient expects to be discharged to:: Private residence Living Arrangements: Spouse/significant other Available Help at Discharge: Family;Available 24 hours/day Type of Home: House Home Access: Stairs to enter   [employer] of Steps: 2 Home Layout: Two level;Able to live on main level with bedroom/bathroom Home Equipment: None;Toilet riser      Prior Function Level of Independence: Independent         Comments: lifts weights, does own yardwork     Hand Dominance        Extremity/Trunk Assessment   Upper Extremity Assessment Upper Extremity Assessment: Overall WFL for tasks assessed    Lower Extremity Assessment Lower Extremity Assessment: RLE deficits/detail RLE Deficits / Details: SLR +2/5, knee AAROM 0-75* RLE Sensation: WNL RLE Coordination: WNL    Cervical / Trunk Assessment Cervical / Trunk Assessment: Normal  Communication   Communication: HOH  Cognition Arousal/Alertness: Awake/alert Behavior During Therapy: WFL for tasks assessed/performed Overall Cognitive Status: Within Functional Limits for tasks assessed                                        General Comments      Exercises Total Joint Exercises Ankle  Circles/Pumps: AROM;Both;15 reps;Supine Quad Sets: AROM;Right;5 reps;Supine Heel Slides: AAROM;Right;10 reps;Supine   Assessment/Plan    PT Assessment Patient needs continued PT services  PT Problem List Decreased mobility;Decreased range of motion;Decreased activity tolerance;Decreased knowledge of use of DME;Decreased strength       PT Treatment Interventions DME instruction;Gait training;Therapeutic activities;Therapeutic exercise;Patient/family education    PT Goals (Current goals can be found in the Care Plan section)  Acute Rehab PT Goals Patient Stated  Goal: yardwork PT Goal Formulation: With patient Time For Goal Achievement: 12/11/20 Potential to Achieve Goals: Good    Frequency 7X/week   Barriers to discharge        Co-evaluation               AM-PAC PT "6 Clicks" Mobility  Outcome Measure Help needed turning from your back to your side while in a flat bed without using bedrails?: A Little Help needed moving from lying on your back to sitting on the side of a flat bed without using bedrails?: A Little Help needed moving to and from a bed to a chair (including a wheelchair)?: A Little Help needed standing up from a chair using your arms (e.g., wheelchair or bedside chair)?: A Little Help needed to walk in hospital room?: A Little Help needed climbing 3-5 steps with a railing? : A Lot 6 Click Score: 17    End of Session Equipment Utilized During Treatment: Gait belt Activity Tolerance: Patient tolerated treatment well Patient left: in chair;with call bell/phone within reach;with chair alarm set Nurse Communication: Mobility status PT Visit Diagnosis: Difficulty in walking, not elsewhere classified (R26.2);Muscle weakness (generalized) (M62.81)    Time: [id number] PT Time Calculation (min) (ACUTE ONLY): 18 min   Charges:   PT Evaluation $PT Eval Moderate Complexity: 1 Mod         Philomena Doheny PT 12/04/2020  Acute Rehabilitation Services Pager [phone] Office [phone]

## 2020-12-05 DIAGNOSIS — M1711 Unilateral primary osteoarthritis, right knee: Secondary | ICD-10-CM | POA: Diagnosis not present

## 2020-12-05 DIAGNOSIS — M25561 Pain in right knee: Secondary | ICD-10-CM | POA: Diagnosis not present

## 2020-12-05 LAB — BASIC METABOLIC PANEL
Anion gap: 5 (ref 5–15)
BUN: 18 mg/dL (ref 8–23)
CO2: 25 mmol/L (ref 22–32)
Calcium: 9 mg/dL (ref 8.9–10.3)
Chloride: 109 mmol/L (ref 98–111)
Creatinine, Ser: 1.06 mg/dL (ref 0.61–1.24)
GFR, Estimated: >60 mL/min (ref >60)
Glucose, Bld: 134 mg/dL — ABNORMAL HIGH (ref 70–99)
Potassium: 4.2 mmol/L (ref 3.5–5.1)
Sodium: 139 mmol/L (ref 135–145)

## 2020-12-05 LAB — CBC
HCT: 37.6 % — ABNORMAL LOW (ref 39.0–52.0)
Hemoglobin: 13.4 g/dL (ref 13.0–17.0)
MCH: 34.2 pg — ABNORMAL HIGH (ref 26.0–34.0)
MCHC: 35.6 g/dL (ref 30.0–36.0)
MCV: 95.9 fL (ref 80.0–100.0)
Platelets: 129 10*3/uL — ABNORMAL LOW (ref 150–400)
RBC: 3.92 MIL/uL — ABNORMAL LOW (ref 4.22–5.81)
RDW: 13.1 % (ref 11.5–15.5)
WBC: 9.3 10*3/uL (ref 4.0–10.5)
nRBC: 0 % (ref 0.0–0.2)

## 2020-12-05 MED ORDER — ASPIRIN EC 81 MG PO TBEC: 81.0000 mg | DELAYED_RELEASE_TABLET | Freq: Every morning | ORAL | 11 refills | Status: AC

## 2020-12-05 MED ORDER — HYDROCODONE-ACETAMINOPHEN 5-325 MG PO TABS: 1.0000 | ORAL_TABLET | ORAL | 0 refills | Status: DC | PRN

## 2020-12-05 MED ORDER — ASPIRIN 81 MG PO CHEW: 81.0000 mg | CHEWABLE_TABLET | Freq: Two times a day (BID) | ORAL | 0 refills | Status: AC

## 2020-12-05 MED ORDER — METHOCARBAMOL 500 MG PO TABS: 500.0000 mg | ORAL_TABLET | Freq: Four times a day (QID) | ORAL | 0 refills | Status: DC | PRN

## 2020-12-05 MED ORDER — DOCUSATE SODIUM 100 MG PO CAPS: 100.0000 mg | ORAL_CAPSULE | Freq: Two times a day (BID) | ORAL | 0 refills | Status: DC

## 2020-12-05 MED ORDER — POLYETHYLENE GLYCOL 3350 17 G PO PACK: 17.0000 g | PACK | Freq: Every day | ORAL | 0 refills | Status: DC | PRN

## 2020-12-05 MED ORDER — CELECOXIB 200 MG PO CAPS: 200.0000 mg | ORAL_CAPSULE | Freq: Every day | ORAL | 0 refills | Status: DC

## 2020-12-05 MED ORDER — ASPIRIN 81 MG PO CHEW: 81.0000 mg | CHEWABLE_TABLET | Freq: Two times a day (BID) | ORAL | 0 refills | Status: DC

## 2020-12-05 NOTE — Progress Notes (Signed)
Subjective: 1 Day Post-Op Procedure(s) (LRB): TOTAL KNEE ARTHROPLASTY (Right) Patient reports pain as mild.   Patient seen in rounds with Dr. Alvan Dame. Patient is well, and has had no acute complaints or problems. Patient reports he had a fall when transferring from the recliner to the bed yesterday and hit the knee incision. He reports no increased pain. Foley catheter removed. Patient ambulated 55 feet with PT.  We will continue therapy today.   Objective: Vital signs in last 24 hours: Temp:  [97.4 F (36.3 C)-97.8 F (36.6 C)] 97.6 F (36.4 C) (08/31 0230) Pulse Rate:  [61-87] 65 (08/31 0549) Resp:  [10-18] 18 (08/31 0549) BP: (103-126)/(50-70) 112/63 (08/31 0549) SpO2:  [93 %-100 %] 98 % (08/31 0549) Weight:  [78.5 kg] 78.5 kg (08/30 2257)  Intake/Output from previous day:  Intake/Output Summary (Last 24 hours) at 12/05/2020 0735 Last data filed at 12/05/2020 0600 Gross per 24 hour  Intake 3027.1 ml  Output 1800 ml  Net 1227.1 ml     Intake/Output this shift: No intake/output data recorded.  Labs: Recent Labs    12/05/20 0324  HGB 13.4   Recent Labs    12/05/20 0324  WBC 9.3  RBC 3.92*  HCT 37.6*  PLT 129*   Recent Labs    12/05/20 0324  NA 139  K 4.2  CL 109  CO2 25  BUN 18  CREATININE 1.06  GLUCOSE 134*  CALCIUM 9.0   No results for input(s): LABPT, INR in the last 72 hours.  Exam: General - Patient is Alert and Oriented Extremity - Neurologically intact Sensation intact distally Intact pulses distally Dorsiflexion/Plantar flexion intact No evidence of issues with the incision related to his fall. No bleeding or increased swelling. He does have extremely thin skin, and reports he had a skin tear about the lateral knee which is dressed with a mepilex. Dressing - dressing C/D/I Motor Function - intact, moving foot and toes well on exam.   Past Medical History:  Diagnosis Date   Allergy    SEASONAL   Arthritis    BACK AND KNEES   Cancer  ([place])    Complication of anesthesia    pt states aspirated following ablation    Coronary artery disease    Dysrhythmia    GERD (gastroesophageal reflux disease)    History of kidney stones 07/2006   x 1   Hyperlipidemia    Hypertension    Normal coronary arteries 2008   Normal LV function   Paroxysmal atrial fibrillation ([place])    2008 and 20014, PVI 09/07/12    Assessment/Plan: 1 Day Post-Op Procedure(s) (LRB): TOTAL KNEE ARTHROPLASTY (Right) Active Problems:   S/P total knee arthroplasty, right  Estimated body mass index is 24.14 kg/m as calculated from the following:   Height as of this encounter: '5\' 11"'$  (1.803 m).   Weight as of this encounter: 78.5 kg. Advance diet Up with therapy D/C IV fluids   Patient's anticipated LOS is less than 2 midnights, meeting these requirements: - Younger than 36 - Lives within 1 hour of care - Has a competent adult at home to recover with post-op recover - NO history of  - Chronic pain requiring opiods  - Diabetes  - Coronary Artery Disease  - Heart failure  - Heart attack  - Stroke  - DVT/VTE  - Cardiac arrhythmia  - Respiratory Failure/COPD  - Renal failure  - Anemia  - Advanced Liver disease     DVT Prophylaxis -  Aspirin Weight bearing as tolerated.  Hgb stable at 13.4 this AM.   Plan is to go Home after hospital stay. Plan for discharge today following 1-2 sessions of PT as long as they are meeting their goals. Patient is scheduled for OPPT. Follow up in the office in 2 weeks.   Please send a few mepilex dressings home with him to use on the lateral knee skin tear.   Griffith Citron, PA-C Orthopedic Surgery [phone] 12/05/2020, 7:35 AM

## 2020-12-05 NOTE — Progress Notes (Signed)
Physical Therapy Treatment Patient Details Name: Luis [name] MRN: [id number] DOB: 20-Feb-1946 Today's Date: 12/05/2020    History of Present Illness 75 y.o. male admitted 12/04/2020 for R TKA. PMH includes afib, near syncope, dizziness.    PT Comments    Pt reported his R knee buckled yesterday with recliner to bed transfer. Pt is progressing well with mobility, he ambulated 150' with RW, no buckling of R knee, no loss of balance.  Stair training completed. Pt demonstrates good understanding of HEP. He is ready to DC from PT standpoint.    Follow Up Recommendations  Follow surgeon's recommendation for DC plan and follow-up therapies;Outpatient PT     Equipment Recommendations  Rolling walker with 5" wheels    Recommendations for Other Services       Precautions / Restrictions Precautions Precautions: Fall;Knee Precaution Booklet Issued: Yes (comment) Precaution Comments: reviewed no pillow under knee, denies falls in past 1 year though R knee has given out a few times. Pt reports R knee buckled yesterday when transferring recliner to bed. Restrictions Weight Bearing Restrictions: No Other Position/Activity Restrictions: WBAT RLE    Mobility  Bed Mobility Overal bed mobility: Modified Independent Bed Mobility: Supine to Sit     Supine to sit: HOB elevated;Modified independent (Device/Increase time)     General bed mobility comments: used rail    Transfers Overall transfer level: Needs assistance Equipment used: Rolling walker (2 wheeled) Transfers: Sit to/from Stand Sit to Stand: Supervision         General transfer comment: VCs hand placement  Ambulation/Gait Ambulation/Gait assistance: Supervision Gait Distance (Feet): 150 Feet Assistive device: Rolling walker (2 wheeled) Gait Pattern/deviations: Step-to pattern Gait velocity: decr   General Gait Details: VCs sequencing, no loss of balance, no buckling of R knee   Stairs Stairs: Yes Stairs  assistance: Min assist Stair Management: No rails;Step to pattern;With walker Number of Stairs: 1 General stair comments: pt practiced going up forwards and backwards, VCs sequencing. 1 step x 2 trials. min A to manage RW   Wheelchair Mobility    Modified Rankin (Stroke Patients Only)       Balance Overall balance assessment: Modified Independent                                          Cognition Arousal/Alertness: Awake/alert Behavior During Therapy: WFL for tasks assessed/performed Overall Cognitive Status: Within Functional Limits for tasks assessed                                        Exercises Total Joint Exercises Ankle Circles/Pumps: AROM;Both;15 reps;Supine Quad Sets: AROM;Right;5 reps;Supine Short Arc Quad: AROM;Right;10 reps;Supine Heel Slides: AAROM;Right;10 reps;Supine Hip ABduction/ADduction: AROM;Right;10 reps;Supine Straight Leg Raises: AROM;Right;10 reps;Supine Long Arc Quad: AROM;Right;10 reps;Seated Knee Flexion: AAROM;Right;10 reps;Seated Goniometric ROM: 0-90* AAROM R knee    General Comments        Pertinent Vitals/Pain Pain Assessment: No/denies pain    Home Living                      Prior Function            PT Goals (current goals can now be found in the care plan section) Acute Rehab PT Goals Patient Stated Goal: yardwork PT Goal Formulation: With patient  Time For Goal Achievement: 12/11/20 Potential to Achieve Goals: Good Progress towards PT goals: Progressing toward goals    Frequency    7X/week      PT Plan Current plan remains appropriate    Co-evaluation              AM-PAC PT "6 Clicks" Mobility   Outcome Measure  Help needed turning from your back to your side while in a flat bed without using bedrails?: None Help needed moving from lying on your back to sitting on the side of a flat bed without using bedrails?: None Help needed moving to and from a bed to a  chair (including a wheelchair)?: A Little Help needed standing up from a chair using your arms (e.g., wheelchair or bedside chair)?: A Little Help needed to walk in hospital room?: A Little Help needed climbing 3-5 steps with a railing? : A Little 6 Click Score: 20    End of Session Equipment Utilized During Treatment: Gait belt Activity Tolerance: Patient tolerated treatment well Patient left: in chair;with call bell/phone within reach;with chair alarm set Nurse Communication: Mobility status PT Visit Diagnosis: Difficulty in walking, not elsewhere classified (R26.2);Muscle weakness (generalized) (M62.81)     Time: [id number] PT Time Calculation (min) (ACUTE ONLY): 35 min  Charges:  $Gait Training: 8-22 mins $Therapeutic Exercise: 8-22 mins                    Blondell Reveal Kistler PT 12/05/2020  Acute Rehabilitation Services Pager [phone] Office [phone]

## 2020-12-05 NOTE — TOC Transition Note (Signed)
Transition of Care [facility]) - CM/SW Discharge Note   Patient Details  Name: Luis [name] MRN: [id number] Date of Birth: [birth year]  Transition of [facility]) CM/SW Contact:  Lennart Pall, LCSW Phone Number: 12/05/2020, 9:49 AM   Clinical Narrative:    Met briefly with pt and confirming receipt of rw.  Plan OPPT at [facility].  No TOC needs.   Final next level of care: OP Rehab Barriers to Discharge: No Barriers Identified   Patient Goals and CMS Choice Patient states their goals for this hospitalization and ongoing recovery are:: return home      Discharge Placement                       Discharge Plan and Services                DME Arranged: Walker rolling DME Agency: [facility]                  Social Determinants of Health (SDOH) Interventions     Readmission Risk Interventions No flowsheet data found.

## 2020-12-06 ENCOUNTER — Encounter (HOSPITAL_COMMUNITY): Payer: Self-pay | Admitting: Orthopedic Surgery

## 2020-12-06 NOTE — Discharge Summary (Signed)
Physician Discharge Summary   Patient ID: Luis [name] MRN: [id number] DOB/AGE: [birth year] 75 y.o.  Admit date: 12/04/2020 Discharge date: 12/05/2020  Primary Diagnosis: Right knee osteoarthritis.   Admission Diagnoses:  Past Medical History:  Diagnosis Date   Allergy    SEASONAL   Arthritis    BACK AND KNEES   Cancer ([place])    Complication of anesthesia    pt states aspirated following ablation    Coronary artery disease    Dysrhythmia    GERD (gastroesophageal reflux disease)    History of kidney stones 07/2006   x 1   Hyperlipidemia    Hypertension    Normal coronary arteries 2008   Normal LV function   Paroxysmal atrial fibrillation ([place])    2008 and 20014, PVI 09/07/12   Discharge Diagnoses:   Active Problems:   S/P total knee arthroplasty, right  Estimated body mass index is 24.14 kg/m as calculated from the following:   Height as of this encounter: '5\' 11"'$  (1.803 m).   Weight as of this encounter: 78.5 kg.  Procedure:  Procedure(s) (LRB): TOTAL KNEE ARTHROPLASTY (Right)   Consults: None  HPI:  Luis [name] is a 75 y.o. male patient of   mine.  The patient had been seen, evaluated, and treated for months conservatively in the   office with medication, activity modification, and injections.  The patient had   radiographic changes of bone-on-bone arthritis with endplate sclerosis and osteophytes noted.  Based on the radiographic changes and failed conservative measures, the patient   decided to proceed with definitive treatment, total knee replacement.  Risks of infection, DVT, component failure, need for revision surgery, neurovascular injury were reviewed in the office setting.  The postop course was reviewed stressing the efforts to maximize post-operative satisfaction and function.  Consent was obtained for benefit of pain   relief.   Laboratory Data: Admission on 12/04/2020, Discharged on 12/05/2020  Component Date Value Ref Range Status   ABO/RH(D)  12/04/2020    Final                   Value:A POS Performed at [facility] [address], [place], [place] [address]    WBC 12/05/2020 9.3  4.0 - 10.5 K/uL Final   RBC 12/05/2020 3.92 (A) 4.22 - 5.81 MIL/uL Final   Hemoglobin 12/05/2020 13.4  13.0 - 17.0 g/dL Final   HCT 12/05/2020 37.6 (A) 39.0 - 52.0 % Final   MCV 12/05/2020 95.9  80.0 - 100.0 fL Final   MCH 12/05/2020 34.2 (A) 26.0 - 34.0 pg Final   MCHC 12/05/2020 35.6  30.0 - 36.0 g/dL Final   RDW 12/05/2020 13.1  11.5 - 15.5 % Final   Platelets 12/05/2020 129 (A) 150 - 400 K/uL Final   nRBC 12/05/2020 0.0  0.0 - 0.2 % Final   Performed at [facility], [place] [address], [place], [place] [address]   Sodium 12/05/2020 139  135 - 145 mmol/L Final   Potassium 12/05/2020 4.2  3.5 - 5.1 mmol/L Final   Chloride 12/05/2020 109  98 - 111 mmol/L Final   CO2 12/05/2020 25  22 - 32 mmol/L Final   Glucose, Bld 12/05/2020 134 (A) 70 - 99 mg/dL Final   Glucose reference range applies only to samples taken after fasting for at least 8 hours.   BUN 12/05/2020 18  8 - 23 mg/dL Final   Creatinine, Ser 12/05/2020 1.06  0.61 - 1.24  mg/dL Final   Calcium 12/05/2020 9.0  8.9 - 10.3 mg/dL Final   GFR, Estimated 12/05/2020 >60  >60 mL/min Final   Comment: (NOTE) Calculated using the CKD-EPI Creatinine Equation (2021)    Anion gap 12/05/2020 5  5 - 15 Final   Performed at [facility], [place] [address] [place], [place] [address]  Orders Only on 11/30/2020  Component Date Value Ref Range Status   SARS Coronavirus 2 11/30/2020 RESULT: NEGATIVE   Final   Comment: RESULT: NEGATIVESARS-CoV-2 INTERPRETATION:A NEGATIVE  test result means that SARS-CoV-2 RNA was not present in the specimen above the limit of detection of this test. This does not preclude a possible SARS-CoV-2 infection and should not be used as the  sole basis for patient management decisions. Negative results must be combined with  clinical observations, patient history, and epidemiological information. Optimum specimen types and timing for peak viral levels during infections caused by SARS-CoV-2  have not been determined. Collection of multiple specimens or types of specimens may be necessary to detect virus. Improper specimen collection and handling, sequence variability under primers/probes, or organism present below the limit of detection may  lead to false negative results. Positive and negative predictive values of testing are highly dependent on prevalence. False negative test results are more likely when prevalence of disease is high.The expected result is NEGATIVE.Fact S                          heet for  Healthcare Providers: LocalChronicle.no Sheet for Patients: SalonLookup.es Reference Range - Negative   Appointment on 11/26/2020  Component Date Value Ref Range Status   Date Time Interrogation Session 11/16/2020 [id number]   Final   Pulse Generator Manufacturer 11/16/2020 MERM   Final   Pulse Gen Model 11/16/2020 [id number] II   Final   Pulse Gen Serial Number 11/16/2020 [id number]   Final   Clinic Name 11/16/2020 [facility]   Final   Implantable Pulse Generator Type 11/16/2020 ICM/ILR   Final   Implantable Pulse Generator Implan* 11/16/2020 [id number]   Final  Hospital Outpatient Visit on 11/21/2020  Component Date Value Ref Range Status   MRSA, PCR 11/21/2020 NEGATIVE  NEGATIVE Final   Staphylococcus aureus 11/21/2020 NEGATIVE  NEGATIVE Final   Comment: (NOTE) The Xpert SA Assay (FDA approved for NASAL specimens in patients 58 years of age and older), is one component of a comprehensive surveillance program. It is not intended to diagnose infection nor to guide or monitor treatment. Performed at [facility], [place] [address] [place], [place] [address]    WBC 11/21/2020 6.1  4.0 - 10.5 K/uL Final   RBC 11/21/2020 5.27   4.22 - 5.81 MIL/uL Final   Hemoglobin 11/21/2020 17.6 (A) 13.0 - 17.0 g/dL Final   HCT 11/21/2020 50.7  39.0 - 52.0 % Final   MCV 11/21/2020 96.2  80.0 - 100.0 fL Final   MCH 11/21/2020 33.4  26.0 - 34.0 pg Final   MCHC 11/21/2020 34.7  30.0 - 36.0 g/dL Final   RDW 11/21/2020 13.1  11.5 - 15.5 % Final   Platelets 11/21/2020 147 (A) 150 - 400 K/uL Final   nRBC 11/21/2020 0.0  0.0 - 0.2 % Final   Performed at [facility], [place] [address], [place], [place] [address]   Sodium 11/21/2020 138  135 - 145 mmol/L Final   Potassium 11/21/2020 4.9  3.5 - 5.1 mmol/L Final   Chloride  11/21/2020 104  98 - 111 mmol/L Final   CO2 11/21/2020 28  22 - 32 mmol/L Final   Glucose, Bld 11/21/2020 110 (A) 70 - 99 mg/dL Final   Glucose reference range applies only to samples taken after fasting for at least 8 hours.   BUN 11/21/2020 18  8 - 23 mg/dL Final   Creatinine, Ser 11/21/2020 1.21  0.61 - 1.24 mg/dL Final   Calcium 11/21/2020 9.9  8.9 - 10.3 mg/dL Final   Total Protein 11/21/2020 6.8  6.5 - 8.1 g/dL Final   Albumin 11/21/2020 4.1  3.5 - 5.0 g/dL Final   AST 11/21/2020 24  15 - 41 U/L Final   ALT 11/21/2020 21  0 - 44 U/L Final   Alkaline Phosphatase 11/21/2020 73  38 - 126 U/L Final   Total Bilirubin 11/21/2020 1.2  0.3 - 1.2 mg/dL Final   GFR, Estimated 11/21/2020 >60  >60 mL/min Final   Comment: (NOTE) Calculated using the CKD-EPI Creatinine Equation (2021)    Anion gap 11/21/2020 6  5 - 15 Final   Performed at [facility] [place] [address], [place], [place] [address]   ABO/RH(D) 11/21/2020 A POS   Final   Antibody Screen 11/21/2020 NEG   Final   Sample Expiration 11/21/2020 12/05/2020,2359   Final   Extend sample reason 11/21/2020    Final                   Value:NO TRANSFUSIONS OR PREGNANCY IN THE PAST 3 MONTHS Performed at [facility] [address], [place],  [address]   Clinical Support on 10/22/2020  Component Date Value  Ref Range Status   Date Time Interrogation Session 10/14/2020 [id number]   Final   Pulse Generator Manufacturer 10/14/2020 MERM   Final   Pulse Gen Model 10/14/2020 [id number] LINQ II   Final   Pulse Gen Serial Number 10/14/2020 [id number]   Final   Clinic Name 10/14/2020 [facility]   Final   Implantable Pulse Generator Type 10/14/2020 ICM/ILR   Final   Implantable Pulse Generator Implan* 10/14/2020 [id number]   Final   Eval Rhythm 10/14/2020 SR at 60 bpm   Final     X-Rays:CUP PACEART REMOTE DEVICE CHECK  Result Date: 11/22/2020 ILR summary report received. Battery status OK. Normal device function. No new symptom, tachy, brady, or pause episodes. No new AF episodes. Monthly summary reports and ROV/PRN   EKG: Orders placed or performed in visit on 03/26/20   EKG 12-Lead     Hospital Course: Luis [name] is a 75 y.o. who was admitted to [facility]. They were brought to the operating room on 12/04/2020 and underwent Procedure(s): TOTAL KNEE ARTHROPLASTY.  Patient tolerated the procedure well and was later transferred to the recovery room and then to the orthopaedic floor for postoperative care. They were given PO and IV analgesics for pain control following their surgery. They were given 24 hours of postoperative antibiotics of  Anti-infectives (From admission, onward)    Start     Dose/Rate Route Frequency Ordered Stop   12/04/20 1330  ceFAZolin (ANCEF) IVPB 2g/100 mL premix        2 g 200 mL/hr over 30 Minutes Intravenous Every 6 hours 12/04/20 1143 12/04/20 2028   12/04/20 0600  ceFAZolin (ANCEF) IVPB 2g/100 mL premix        2 g 200 mL/hr over 30 Minutes Intravenous On call to O.R. 12/04/20 [id number] 12/04/20 0720  and started on DVT prophylaxis in the form of Aspirin.   PT and OT were ordered for total joint protocol. Discharge planning consulted to help with postop disposition and equipment needs.  Patient had a good night on the evening of surgery. They started to  get up OOB with therapy on POD #0. Pt was seen during rounds and was ready to go home pending progress with therapy.He worked with therapy on POD #1 and was meeting his goals. Pt was discharged to home later that day in stable condition.  Diet: Regular diet Activity: WBAT Follow-up: in 2 weeks Disposition: Home Discharged Condition: good   Discharge Instructions     Call MD / Call 911   Complete by: As directed    If you experience chest pain or shortness of breath, CALL 911 and be transported to the hospital emergency room.  If you develope a fever above 101 F, pus (white drainage) or increased drainage or redness at the wound, or calf pain, call your surgeon's office.   Change dressing   Complete by: As directed    Maintain surgical dressing until follow up in the clinic. If the edges start to pull up, may reinforce with tape. If the dressing is no longer working, may remove and cover with gauze and tape, but must keep the area dry and clean.  Call with any questions or concerns.   Constipation Prevention   Complete by: As directed    Drink plenty of fluids.  Prune juice may be helpful.  You may use a stool softener, such as Colace (over the counter) 100 mg twice a day.  Use MiraLax (over the counter) for constipation as needed.   Diet - low sodium heart healthy   Complete by: As directed    Increase activity slowly as tolerated   Complete by: As directed    Weight bearing as tolerated with assist device (walker, cane, etc) as directed, use it as long as suggested by your surgeon or therapist, typically at least 4-6 weeks.   Post-operative opioid taper instructions:   Complete by: As directed    POST-OPERATIVE OPIOID TAPER INSTRUCTIONS: It is important to wean off of your opioid medication as soon as possible. If you do not need pain medication after your surgery it is ok to stop day one. Opioids include: Codeine, Hydrocodone(Norco, Vicodin), Oxycodone(Percocet, oxycontin) and  hydromorphone amongst others.  Long term and even short term use of opiods can cause: Increased pain response Dependence Constipation Depression Respiratory depression And more.  Withdrawal symptoms can include Flu like symptoms Nausea, vomiting And more Techniques to manage these symptoms Hydrate well Eat regular healthy meals Stay active Use relaxation techniques(deep breathing, meditating, yoga) Do Not substitute Alcohol to help with tapering If you have been on opioids for less than two weeks and do not have pain than it is ok to stop all together.  Plan to wean off of opioids This plan should start within one week post op of your joint replacement. Maintain the same interval or time between taking each dose and first decrease the dose.  Cut the total daily intake of opioids by one tablet each day Next start to increase the time between doses. The last dose that should be eliminated is the evening dose.      TED hose   Complete by: As directed    Use stockings (TED hose) for 2 weeks on both leg(s).  You may remove them at night for sleeping.  Allergies as of 12/05/2020   No Known Allergies      Medication List     TAKE these medications    aspirin EC 81 MG tablet Take 1 tablet (81 mg total) by mouth in the morning. Resume once daily aspirin after completing 28 days of twice daily dosing. Swallow whole. What changed: additional instructions   aspirin 81 MG chewable tablet Chew 1 tablet (81 mg total) by mouth 2 (two) times daily for 28 days. What changed: You were already taking a medication with the same name, and this prescription was added. Make sure you understand how and when to take each.   celecoxib 200 MG capsule Commonly known as: CELEBREX Take 1 capsule (200 mg total) by mouth daily.   docusate sodium 100 MG capsule Commonly known as: COLACE Take 1 capsule (100 mg total) by mouth 2 (two) times daily.   HYDROcodone-acetaminophen 5-325 MG  tablet Commonly known as: NORCO/VICODIN Take 1-2 tablets by mouth every 4 (four) hours as needed for severe pain.   methocarbamol 500 MG tablet Commonly known as: ROBAXIN Take 1 tablet (500 mg total) by mouth every 6 (six) hours as needed for muscle spasms.   metoprolol tartrate 50 MG tablet Commonly known as: LOPRESSOR Take 1 tablet by mouth twice daily   omeprazole 20 MG capsule Commonly known as: PRILOSEC Take 20 mg by mouth in the morning.   polyethylene glycol 17 g packet Commonly known as: MIRALAX / GLYCOLAX Take 17 g by mouth daily as needed for mild constipation.   rosuvastatin 10 MG tablet Commonly known as: CRESTOR Take 1 tablet (10 mg total) by mouth daily. What changed: when to take this               Discharge Care Instructions  (From admission, onward)           Start     Ordered   12/05/20 0000  Change dressing       Comments: Maintain surgical dressing until follow up in the clinic. If the edges start to pull up, may reinforce with tape. If the dressing is no longer working, may remove and cover with gauze and tape, but must keep the area dry and clean.  Call with any questions or concerns.   12/05/20 0740            Follow-up Information     Paralee Cancel, MD. Go on 12/17/2020.   Specialty: Orthopedic Surgery Why: You are scheduled for first post op appointment on Monday September 12th at 10:00am. Contact information: [address] [place] [address] [place] [address] [id number]                 Signed: Griffith Citron, PA-C Orthopedic Surgery 12/06/2020, 2:19 PM

## 2020-12-12 DIAGNOSIS — M25561 Pain in right knee: Secondary | ICD-10-CM | POA: Diagnosis not present

## 2020-12-12 NOTE — Progress Notes (Signed)
Carelink Summary Report / Loop Recorder 

## 2020-12-17 DIAGNOSIS — M25561 Pain in right knee: Secondary | ICD-10-CM | POA: Diagnosis not present

## 2020-12-19 DIAGNOSIS — M25561 Pain in right knee: Secondary | ICD-10-CM | POA: Diagnosis not present

## 2020-12-20 ENCOUNTER — Ambulatory Visit (INDEPENDENT_AMBULATORY_CARE_PROVIDER_SITE_OTHER): Payer: PPO

## 2020-12-20 DIAGNOSIS — I480 Paroxysmal atrial fibrillation: Secondary | ICD-10-CM | POA: Diagnosis not present

## 2020-12-20 LAB — CUP PACEART REMOTE DEVICE CHECK
Date Time Interrogation Session: 20220914230436
Implantable Pulse Generator Implant Date: 20200918

## 2020-12-21 DIAGNOSIS — M25561 Pain in right knee: Secondary | ICD-10-CM | POA: Diagnosis not present

## 2020-12-24 DIAGNOSIS — M25562 Pain in left knee: Secondary | ICD-10-CM | POA: Diagnosis not present

## 2020-12-26 DIAGNOSIS — M25561 Pain in right knee: Secondary | ICD-10-CM | POA: Diagnosis not present

## 2020-12-27 NOTE — Progress Notes (Signed)
Carelink Summary Report / Loop Recorder 

## 2020-12-28 DIAGNOSIS — M25561 Pain in right knee: Secondary | ICD-10-CM | POA: Diagnosis not present

## 2021-01-01 DIAGNOSIS — M25561 Pain in right knee: Secondary | ICD-10-CM | POA: Diagnosis not present

## 2021-01-03 DIAGNOSIS — M25561 Pain in right knee: Secondary | ICD-10-CM | POA: Diagnosis not present

## 2021-01-08 DIAGNOSIS — M25561 Pain in right knee: Secondary | ICD-10-CM | POA: Diagnosis not present

## 2021-01-11 DIAGNOSIS — M25561 Pain in right knee: Secondary | ICD-10-CM | POA: Diagnosis not present

## 2021-01-16 DIAGNOSIS — Z471 Aftercare following joint replacement surgery: Secondary | ICD-10-CM | POA: Diagnosis not present

## 2021-01-16 DIAGNOSIS — M25562 Pain in left knee: Secondary | ICD-10-CM | POA: Diagnosis not present

## 2021-01-16 DIAGNOSIS — M24661 Ankylosis, right knee: Secondary | ICD-10-CM | POA: Diagnosis not present

## 2021-01-16 DIAGNOSIS — M1712 Unilateral primary osteoarthritis, left knee: Secondary | ICD-10-CM | POA: Diagnosis not present

## 2021-01-16 DIAGNOSIS — M25561 Pain in right knee: Secondary | ICD-10-CM | POA: Diagnosis not present

## 2021-01-16 DIAGNOSIS — Z96651 Presence of right artificial knee joint: Secondary | ICD-10-CM | POA: Diagnosis not present

## 2021-01-18 DIAGNOSIS — M25561 Pain in right knee: Secondary | ICD-10-CM | POA: Diagnosis not present

## 2021-01-21 DIAGNOSIS — M25561 Pain in right knee: Secondary | ICD-10-CM | POA: Diagnosis not present

## 2021-01-22 ENCOUNTER — Ambulatory Visit (INDEPENDENT_AMBULATORY_CARE_PROVIDER_SITE_OTHER): Payer: PPO

## 2021-01-22 DIAGNOSIS — I480 Paroxysmal atrial fibrillation: Secondary | ICD-10-CM | POA: Diagnosis not present

## 2021-01-23 DIAGNOSIS — M25561 Pain in right knee: Secondary | ICD-10-CM | POA: Diagnosis not present

## 2021-01-24 LAB — CUP PACEART REMOTE DEVICE CHECK
Date Time Interrogation Session: 20221017230438
Implantable Pulse Generator Implant Date: 20200918

## 2021-01-25 DIAGNOSIS — M25561 Pain in right knee: Secondary | ICD-10-CM | POA: Diagnosis not present

## 2021-01-29 DIAGNOSIS — M25561 Pain in right knee: Secondary | ICD-10-CM | POA: Diagnosis not present

## 2021-01-30 DIAGNOSIS — M25562 Pain in left knee: Secondary | ICD-10-CM | POA: Diagnosis not present

## 2021-01-30 DIAGNOSIS — M1712 Unilateral primary osteoarthritis, left knee: Secondary | ICD-10-CM | POA: Diagnosis not present

## 2021-01-30 DIAGNOSIS — Z471 Aftercare following joint replacement surgery: Secondary | ICD-10-CM | POA: Diagnosis not present

## 2021-01-30 DIAGNOSIS — Z96651 Presence of right artificial knee joint: Secondary | ICD-10-CM | POA: Diagnosis not present

## 2021-01-30 NOTE — Progress Notes (Signed)
Carelink Summary Report / Loop Recorder 

## 2021-02-01 DIAGNOSIS — M25561 Pain in right knee: Secondary | ICD-10-CM | POA: Diagnosis not present

## 2021-02-06 DIAGNOSIS — H2513 Age-related nuclear cataract, bilateral: Secondary | ICD-10-CM | POA: Diagnosis not present

## 2021-02-08 DIAGNOSIS — Z125 Encounter for screening for malignant neoplasm of prostate: Secondary | ICD-10-CM | POA: Diagnosis not present

## 2021-02-08 DIAGNOSIS — I10 Essential (primary) hypertension: Secondary | ICD-10-CM | POA: Diagnosis not present

## 2021-02-08 DIAGNOSIS — E785 Hyperlipidemia, unspecified: Secondary | ICD-10-CM | POA: Diagnosis not present

## 2021-02-15 DIAGNOSIS — E162 Hypoglycemia, unspecified: Secondary | ICD-10-CM | POA: Diagnosis not present

## 2021-02-15 DIAGNOSIS — Z0000 Encounter for general adult medical examination without abnormal findings: Secondary | ICD-10-CM | POA: Diagnosis not present

## 2021-02-15 DIAGNOSIS — D692 Other nonthrombocytopenic purpura: Secondary | ICD-10-CM | POA: Diagnosis not present

## 2021-02-15 DIAGNOSIS — E785 Hyperlipidemia, unspecified: Secondary | ICD-10-CM | POA: Diagnosis not present

## 2021-02-15 DIAGNOSIS — D6869 Other thrombophilia: Secondary | ICD-10-CM | POA: Diagnosis not present

## 2021-02-15 DIAGNOSIS — Z1339 Encounter for screening examination for other mental health and behavioral disorders: Secondary | ICD-10-CM | POA: Diagnosis not present

## 2021-02-15 DIAGNOSIS — I10 Essential (primary) hypertension: Secondary | ICD-10-CM | POA: Diagnosis not present

## 2021-02-15 DIAGNOSIS — I4891 Unspecified atrial fibrillation: Secondary | ICD-10-CM | POA: Diagnosis not present

## 2021-02-15 DIAGNOSIS — Z1331 Encounter for screening for depression: Secondary | ICD-10-CM | POA: Diagnosis not present

## 2021-02-15 DIAGNOSIS — R7301 Impaired fasting glucose: Secondary | ICD-10-CM | POA: Diagnosis not present

## 2021-02-15 DIAGNOSIS — I25118 Atherosclerotic heart disease of native coronary artery with other forms of angina pectoris: Secondary | ICD-10-CM | POA: Diagnosis not present

## 2021-02-15 DIAGNOSIS — Z87442 Personal history of urinary calculi: Secondary | ICD-10-CM | POA: Diagnosis not present

## 2021-02-15 DIAGNOSIS — M1990 Unspecified osteoarthritis, unspecified site: Secondary | ICD-10-CM | POA: Diagnosis not present

## 2021-02-15 DIAGNOSIS — K222 Esophageal obstruction: Secondary | ICD-10-CM | POA: Diagnosis not present

## 2021-02-15 DIAGNOSIS — Z23 Encounter for immunization: Secondary | ICD-10-CM | POA: Diagnosis not present

## 2021-02-15 DIAGNOSIS — R82998 Other abnormal findings in urine: Secondary | ICD-10-CM | POA: Diagnosis not present

## 2021-02-25 ENCOUNTER — Ambulatory Visit (INDEPENDENT_AMBULATORY_CARE_PROVIDER_SITE_OTHER): Payer: PPO

## 2021-02-25 DIAGNOSIS — I480 Paroxysmal atrial fibrillation: Secondary | ICD-10-CM

## 2021-02-26 LAB — CUP PACEART REMOTE DEVICE CHECK
Date Time Interrogation Session: 20221119230617
Implantable Pulse Generator Implant Date: 20200918

## 2021-03-05 NOTE — Progress Notes (Signed)
Carelink Summary Report / Loop Recorder 

## 2021-03-06 DIAGNOSIS — E785 Hyperlipidemia, unspecified: Secondary | ICD-10-CM | POA: Diagnosis not present

## 2021-03-06 DIAGNOSIS — I10 Essential (primary) hypertension: Secondary | ICD-10-CM | POA: Diagnosis not present

## 2021-03-06 DIAGNOSIS — M1990 Unspecified osteoarthritis, unspecified site: Secondary | ICD-10-CM | POA: Diagnosis not present

## 2021-03-13 DIAGNOSIS — M5136 Other intervertebral disc degeneration, lumbar region: Secondary | ICD-10-CM | POA: Diagnosis not present

## 2021-03-13 DIAGNOSIS — M545 Low back pain, unspecified: Secondary | ICD-10-CM | POA: Diagnosis not present

## 2021-03-13 DIAGNOSIS — Z471 Aftercare following joint replacement surgery: Secondary | ICD-10-CM | POA: Diagnosis not present

## 2021-03-13 DIAGNOSIS — Z96651 Presence of right artificial knee joint: Secondary | ICD-10-CM | POA: Diagnosis not present

## 2021-03-26 DIAGNOSIS — M5451 Vertebrogenic low back pain: Secondary | ICD-10-CM | POA: Diagnosis not present

## 2021-03-26 DIAGNOSIS — M5136 Other intervertebral disc degeneration, lumbar region: Secondary | ICD-10-CM | POA: Diagnosis not present

## 2021-03-29 ENCOUNTER — Ambulatory Visit (INDEPENDENT_AMBULATORY_CARE_PROVIDER_SITE_OTHER): Payer: PPO

## 2021-03-29 DIAGNOSIS — I480 Paroxysmal atrial fibrillation: Secondary | ICD-10-CM

## 2021-03-29 LAB — CUP PACEART REMOTE DEVICE CHECK
Date Time Interrogation Session: 20221222230728
Implantable Pulse Generator Implant Date: 20200918

## 2021-04-09 NOTE — Progress Notes (Signed)
Carelink Summary Report / Loop Recorder 

## 2021-04-10 ENCOUNTER — Encounter: Payer: Self-pay | Admitting: Physician Assistant

## 2021-04-24 DIAGNOSIS — Z85828 Personal history of other malignant neoplasm of skin: Secondary | ICD-10-CM | POA: Diagnosis not present

## 2021-04-24 DIAGNOSIS — L570 Actinic keratosis: Secondary | ICD-10-CM | POA: Diagnosis not present

## 2021-04-29 ENCOUNTER — Encounter: Payer: Self-pay | Admitting: Physician Assistant

## 2021-04-29 ENCOUNTER — Ambulatory Visit: Payer: PPO | Admitting: Physician Assistant

## 2021-04-29 VITALS — BP 120/64 | HR 72 | Ht 69.5 in | Wt 183.2 lb

## 2021-04-29 DIAGNOSIS — Z86010 Personal history of colonic polyps: Secondary | ICD-10-CM | POA: Diagnosis not present

## 2021-04-29 NOTE — Progress Notes (Signed)
Subjective:    Patient ID: Luis [name], male    DOB: Dec 11, 1945, 76 y.o.   MRN: [id number]  HPI Luis [name] is a pleasant 76 year old white male who comes in today to discuss recall colonoscopy.  He is referred back by his PCP Dr. Francesco [name]. Patient last had colonoscopy in April 2015 per Dr. Deatra [name] with finding of 7 polyps, the largest was 13 mm in the proximal sigmoid colon.  Also noted to have severe diverticulosis in the sigmoid colon and external hemorrhoids.  Path revealed the polyps all to be tubular adenomas with no high-grade dysplasia.  (4 fragments were admixed with polypoid colonic mucosa). Patient is currently asymptomatic from a GI standpoint.  He is not having any problems with abdominal pain, no changes in bowel habits, no melena or hematochezia. He says he is fairly certain that he does not want to proceed with follow-up colonoscopy.  He is concerned about sedation at his age, and complications.   He is concerned that he never received any sort of follow-up communication about follow-up colonoscopy.  I reviewed the letters with him and he was sent a letter in 2018 per Dr. Loletha [name] recommending office follow-up prior to scheduling colonoscopy. Patient has history of atrial fibrillation status post ablation, loop recorder, noncritical coronary artery disease, hypertension, history of GERD with prior stricture. Last echo 2014 with EF 55 to 60%. No current anticoagulation.  Review of Systems Pertinent positive and negative review of systems were noted in the above HPI section.  All other review of systems was otherwise negative.   Outpatient Encounter Medications as of 04/29/2021  Medication Sig   aspirin EC 81 MG tablet Take 1 tablet (81 mg total) by mouth in the morning. Resume once daily aspirin after completing 28 days of twice daily dosing. Swallow whole.   celecoxib (CELEBREX) 200 MG capsule Take 1 capsule (200 mg total) by mouth daily.   metoprolol tartrate (LOPRESSOR) 50 MG tablet  Take 1 tablet by mouth twice daily   omeprazole (PRILOSEC) 20 MG capsule Take 20 mg by mouth in the morning.   rosuvastatin (CRESTOR) 10 MG tablet Take 1 tablet (10 mg total) by mouth daily. (Patient taking differently: Take 10 mg by mouth every evening.)   [DISCONTINUED] docusate sodium (COLACE) 100 MG capsule Take 1 capsule (100 mg total) by mouth 2 (two) times daily.   [DISCONTINUED] HYDROcodone-acetaminophen (NORCO/VICODIN) 5-325 MG tablet Take 1-2 tablets by mouth every 4 (four) hours as needed for severe pain.   [DISCONTINUED] methocarbamol (ROBAXIN) 500 MG tablet Take 1 tablet (500 mg total) by mouth every 6 (six) hours as needed for muscle spasms.   [DISCONTINUED] polyethylene glycol (MIRALAX / GLYCOLAX) 17 g packet Take 17 g by mouth daily as needed for mild constipation.   No facility-administered encounter medications on file as of 04/29/2021.   No Known Allergies Patient Active Problem List   Diagnosis Date Noted   S/P total knee arthroplasty, right 12/04/2020   Near syncope 06/06/2019   Family history of coronary artery disease in brother 06/06/2019   Status post placement of implantable loop recorder 12/24/2018   Dizziness 09/15/2013   Chronic anticoagulation 05/25/2013   Palpitations after GI illness 05/25/2013   Unspecified gastritis and gastroduodenitis without mention of hemorrhage 01/31/2013   GERD (gastroesophageal reflux disease) 01/04/2013   Hematuria 09/14/2012   CAD (coronary artery disease) 09/13/2012   Dyslipidemia 04/11/2010   Essential hypertension 04/11/2010   Paroxysmal atrial fibrillation- RFA June 2014 04/11/2010   HEMORRHOIDS 04/11/2010  STRICTURE AND STENOSIS OF ESOPHAGUS 04/11/2010   DIVERTICULAR DISEASE 04/11/2010   COLONIC POLYPS, HYPERPLASTIC, HX OF 04/11/2010   RENAL CALCULUS, HX OF 04/11/2010   ANAL FISSURE, HX OF 04/11/2010   Social History   Socioeconomic History   Marital status: Married    Spouse name: Not on file   Number of  children: 4   Years of education: Not on file   Highest education level: Not on file  Occupational History   Occupation: retired  Tobacco Use   Smoking status: Former    Years: 35.00    Types: Cigarettes    Quit date: 11/27/1994    Years since quitting: 26.4   Smokeless tobacco: Never  Vaping Use   Vaping Use: Never used  Substance and Sexual Activity   Alcohol use: Yes    Alcohol/week: 7.0 standard drinks    Types: 7 Standard drinks or equivalent per week    Comment: 3-4 oz daily 1/2 drinks per night   Drug use: No   Sexual activity: Not on file  Other Topics Concern   Not on file  Social History Narrative   Lives in [place] with spouse.   Retired Electrical engineer   Social Determinants of Radio broadcast assistant Strain: Not on file  Food Insecurity: Not on file  Transportation Needs: Not on file  Physical Activity: Not on file  Stress: Not on file  Social Connections: Not on file  Intimate Partner Violence: Not on file    Mr. Luis [name]'s family history includes CAD in his father; Heart Problems in his father; Heart Problems (age of onset: 75) in his brother; Hypertension in his father; Liver cancer in his mother; Stroke in his father.      Objective:    Vitals:   04/29/21 1019  BP: 120/64  Pulse: 72    Physical Exam.Well-developed well-nourished eld WM  in no acute distress.  Height, YPPJKD,326  BMI 26.6  HEENT; nontraumatic normocephalic, EOMI, PE R LA, sclera anicteric. Oropharynx;not examined Neck; supple, no JVD Cardiovascular; regular rate and rhythm with S1-S2, no murmur rub or gallop Pulmonary; Clear bilaterally Abdomen; soft, nontender, nondistended, no palpable mass or hepatosplenomegaly, bowel sounds are active Rectal;not done Skin; benign exam, no jaundice rash or appreciable lesions Extremities; no clubbing cyanosis or edema skin warm and dry Neuro/Psych; alert and oriented x4, grossly nonfocal mood and affect appropriate         Assessment & Plan:   #64 76 year old white male with history of adenomatous colon polyps, with 7 polyps removed at colonoscopy in April 2015, all tubular adenomas.  Initial recommendation was for follow-up at 3 years.  Patient has not had follow-up colonoscopy since.  He is currently asymptomatic with no current GI complaints. He is not sure that he wants to proceed with another colonoscopy with concerns regarding sedation and complications with colonoscopy at his age.  #2 hypertension 3.  History of atrial fibrillation status post ablation 4.  History of noncritical coronary artery disease 5.  History of GERD with prior stricture 6.  Diverticulosis #7 Loop recorder in place  Plan; discussed findings of prior colonoscopy with the patient, we discussed risk benefit of colonoscopy. Given multiple polyps at last colonoscopy certainly reasonable to proceed with repeat colonoscopy at this time, however after age 53 continuing surveillance colonoscopies is an individual decision. Patient would like to think about it and he will call back if he decides he wants to proceed.  At that time would schedule him with  Dr. Loletha [name]. Patient was given contact information to speak with my nurse.  Kamari Bilek Genia Harold PA-C 04/29/2021   Cc: Sueanne Margarita, DO

## 2021-04-29 NOTE — Patient Instructions (Signed)
If you are age 78 or older, your body mass index should be between 23-30. Your Body mass index is 26.67 kg/m. If this is out of the aforementioned range listed, please consider follow up with your Primary Care Provider. ________________________________________________________  The [facility] providers would like to encourage you to use [facility] to communicate with providers for non-urgent requests or questions.  Due to long hold times on the telephone, sending your provider a message by [facility] may be a faster and more efficient way to get a response.  Please allow 48 business hours for a response.  Please remember that this is for non-urgent requests.  _______________________________________________________  It has been recommended to you by your physician that you have a(n) Colonoscopy completed. Per your request, we did not schedule the procedure(s) today. Please contact our office at [phone] and speak with Beth, should you decide to have the procedure completed. You will be scheduled for a pre-visit and procedure at that time.  Follow up as needed.  Thank you for entrusting me with your care and choosing [facility]  Amy Esterwood, PA-C

## 2021-04-30 ENCOUNTER — Other Ambulatory Visit: Payer: Self-pay

## 2021-04-30 ENCOUNTER — Telehealth: Payer: Self-pay

## 2021-04-30 MED ORDER — PLENVU 140 G PO SOLR: ORAL | 0 refills | Status: DC

## 2021-04-30 NOTE — Progress Notes (Signed)
____________________________________________________________  Attending physician addendum:  Thank you for sending this case to me. I have reviewed the entire note and agree with the plan.  Will be happy to discuss with him further at office visit.  Wilfrid Lund, MD  ____________________________________________________________

## 2021-04-30 NOTE — Telephone Encounter (Signed)
Patient seen in office 04/29/21. He is calling today to schedule his colonoscopy with Dr Loletha Carrow. Agrees to 05/13/21 at 8:30 am. He is not on supplemental O2, blood thinners or diet pills. He will review his prep instructions on line and call with any questions. Written instructions will be mailed.

## 2021-05-01 ENCOUNTER — Ambulatory Visit (INDEPENDENT_AMBULATORY_CARE_PROVIDER_SITE_OTHER): Payer: PPO

## 2021-05-01 DIAGNOSIS — I480 Paroxysmal atrial fibrillation: Secondary | ICD-10-CM

## 2021-05-01 LAB — CUP PACEART REMOTE DEVICE CHECK
Date Time Interrogation Session: 20230124230159
Implantable Pulse Generator Implant Date: 20200918

## 2021-05-10 NOTE — Progress Notes (Signed)
Carelink Summary Report / Loop Recorder 

## 2021-05-13 ENCOUNTER — Ambulatory Visit (AMBULATORY_SURGERY_CENTER): Payer: PPO | Admitting: Gastroenterology

## 2021-05-13 ENCOUNTER — Other Ambulatory Visit: Payer: Self-pay

## 2021-05-13 ENCOUNTER — Encounter: Payer: Self-pay | Admitting: Gastroenterology

## 2021-05-13 VITALS — BP 123/63 | HR 77 | Temp 98.0°F | Resp 19 | Ht 69.0 in | Wt 183.0 lb

## 2021-05-13 DIAGNOSIS — Z86010 Personal history of colonic polyps: Secondary | ICD-10-CM | POA: Diagnosis not present

## 2021-05-13 DIAGNOSIS — D120 Benign neoplasm of cecum: Secondary | ICD-10-CM

## 2021-05-13 DIAGNOSIS — D122 Benign neoplasm of ascending colon: Secondary | ICD-10-CM

## 2021-05-13 HISTORY — PX: COLONOSCOPY: SHX174

## 2021-05-13 MED ORDER — SODIUM CHLORIDE 0.9 % IV SOLN: 500.0000 mL | Freq: Once | INTRAVENOUS | Status: DC

## 2021-05-13 NOTE — Progress Notes (Signed)
Called to room to assist during endoscopic procedure.  Patient ID and intended procedure confirmed with present staff. Received instructions for my participation in the procedure from the performing physician.  

## 2021-05-13 NOTE — Progress Notes (Signed)
No changes to clinical history since GI office visit on 04/29/20.  The patient is appropriate for an endoscopic procedure in the ambulatory setting.

## 2021-05-13 NOTE — Progress Notes (Signed)
To Pacu, VSS. Report to [email or web address] 

## 2021-05-13 NOTE — Op Note (Signed)
[facility] Patient Name: Luis [name] Procedure Date: 05/13/2021 9:25 AM MRN: [id number] Endoscopist: Mallie Mussel L. Loletha Carrow , MD Age: 76 Referring MD:  Date of Birth: [birth year] Gender: Male Account #: [id number] Procedure:                Colonoscopy Indications:              Increased risk colon cancer surveillance: Personal                            history of adenoma (10 mm or greater in size)                           TA x 7 (one > 41mm) last colonoscopy April 2015 Medicines:                Monitored Anesthesia Care Procedure:                Pre-Anesthesia Assessment:                           - Prior to the procedure, a History and Physical                            was performed, and patient medications and                            allergies were reviewed. The patient's tolerance of                            previous anesthesia was also reviewed. The risks                            and benefits of the procedure and the sedation                            options and risks were discussed with the patient.                            All questions were answered, and informed consent                            was obtained. Prior Anticoagulants: The patient has                            taken no previous anticoagulant or antiplatelet                            agents. ASA Grade Assessment: II - A patient with                            mild systemic disease. After reviewing the risks                            and benefits, the patient was deemed in  satisfactory condition to undergo the procedure.                           After obtaining informed consent, the colonoscope                            was passed under direct vision. Throughout the                            procedure, the patient's blood pressure, pulse, and                            oxygen saturations were monitored continuously. The                            Olympus CF-HQ190L  ([id number]) Colonoscope was                            introduced through the anus and advanced to the the                            cecum, identified by appendiceal orifice and                            ileocecal valve. The colonoscopy was performed                            without difficulty. The patient tolerated the                            procedure well. The quality of the bowel                            preparation was good. The ileocecal valve,                            appendiceal orifice, and rectum were photographed. Scope In: 9:39:36 AM Scope Out: 9:57:04 AM Scope Withdrawal Time: 0 hours 13 minutes 12 seconds  Total Procedure Duration: 0 hours 17 minutes 28 seconds  Findings:                 The perianal and digital rectal examinations were                            normal.                           Repeat examination of right colon under NBI                            performed.                           Multiple diverticula were found in the entire colon.  A diminutive polyp was found in the appendiceal                            orifice. The polyp was flat. The polyp was removed                            with a piecemeal technique using a cold biopsy                            forceps. Resection and retrieval were complete.                           A diminutive polyp was found in the hepatic                            flexure. The polyp was sessile. The polyp was                            removed with a cold snare. Resection and retrieval                            were complete.                           Internal hemorrhoids were found.                           The exam was otherwise without abnormality on                            direct and retroflexion views. Complications:            No immediate complications. Estimated Blood Loss:     Estimated blood loss was minimal. Impression:               - Diverticulosis in the entire  examined colon.                           - One diminutive polyp at the appendiceal orifice,                            removed piecemeal using a cold biopsy forceps.                            Resected and retrieved.                           - One diminutive polyp at the hepatic flexure,                            removed with a cold snare. Resected and retrieved.                           - Internal hemorrhoids.                           -  The examination was otherwise normal on direct                            and retroflexion views. Recommendation:           - Patient has a contact number available for                            emergencies. The signs and symptoms of potential                            delayed complications were discussed with the                            patient. Return to normal activities tomorrow.                            Written discharge instructions were provided to the                            patient.                           - Resume previous diet.                           - Continue present medications.                           - Await pathology results.                           - No repeat surveillance colonoscopy recommended                            due to age, current guidelines and today's findings. Wellington Winegarden L. Loletha Carrow, MD 05/13/2021 10:02:32 AM This report has been signed electronically.

## 2021-05-13 NOTE — Patient Instructions (Signed)
Handouts given on polyps and diverticulosis.    YOU HAD AN ENDOSCOPIC PROCEDURE TODAY AT [facility]:   Refer to the procedure report that was given to you for any specific questions about what was found during the examination.  If the procedure report does not answer your questions, please call your gastroenterologist to clarify.  If you requested that your care partner not be given the details of your procedure findings, then the procedure report has been included in a sealed envelope for you to review at your convenience later.  YOU SHOULD EXPECT: Some feelings of bloating in the abdomen. Passage of more gas than usual.  Walking can help get rid of the air that was put into your GI tract during the procedure and reduce the bloating. If you had a lower endoscopy (such as a colonoscopy or flexible sigmoidoscopy) you may notice spotting of blood in your stool or on the toilet paper. If you underwent a bowel prep for your procedure, you may not have a normal bowel movement for a few days.  Please Note:  You might notice some irritation and congestion in your nose or some drainage.  This is from the oxygen used during your procedure.  There is no need for concern and it should clear up in a day or so.  SYMPTOMS TO REPORT IMMEDIATELY:  Following lower endoscopy (colonoscopy or flexible sigmoidoscopy):  Excessive amounts of blood in the stool  Significant tenderness or worsening of abdominal pains  Swelling of the abdomen that is new, acute  Fever of 100F or higher  For urgent or emergent issues, a gastroenterologist can be reached at any hour by calling [phone]. Do not use MyChart messaging for urgent concerns.    DIET:  We do recommend a small meal at first, but then you may proceed to your regular diet.  Drink plenty of fluids but you should avoid alcoholic beverages for 24 hours.  ACTIVITY:  You should plan to take it easy for the rest of today and you should NOT DRIVE  or use heavy machinery until tomorrow (because of the sedation medicines used during the test).    FOLLOW UP: Our staff will call the number listed on your records 48-72 hours following your procedure to check on you and address any questions or concerns that you may have regarding the information given to you following your procedure. If we do not reach you, we will leave a message.  We will attempt to reach you two times.  During this call, we will ask if you have developed any symptoms of COVID 19. If you develop any symptoms (ie: fever, flu-like symptoms, shortness of breath, cough etc.) before then, please call [phone].  If you test positive for Covid 19 in the 2 weeks post procedure, please call and report this information to [place].    If any biopsies were taken you will be contacted by phone or by letter within the next 1-3 weeks.  Please call us at [phone] if you have not heard about the biopsies in 3 weeks.    SIGNATURES/CONFIDENTIALITY: You and/or your care partner have signed paperwork which will be entered into your electronic medical record.  These signatures attest to the fact that that the information above on your After Visit Summary has been reviewed and is understood.  Full responsibility of the confidentiality of this discharge information lies with you and/or your care-partner.

## 2021-05-15 ENCOUNTER — Telehealth: Payer: Self-pay | Admitting: *Deleted

## 2021-05-15 NOTE — Telephone Encounter (Signed)
°  Follow up Call-  Call back number 05/13/2021  Post procedure Call Back phone  # [phone]  Permission to leave phone message Yes  Some recent data might be hidden     Patient questions:  Do you have a fever, pain , or abdominal swelling? No. Pain Score  0 *  Have you tolerated food without any problems? Yes.    Have you been able to return to your normal activities? Yes.    Do you have any questions about your discharge instructions: Diet   No. Medications  No. Follow up visit  No.  Do you have questions or concerns about your Care? No.  Actions: * If pain score is 4 or above: No action needed, pain <4.

## 2021-05-16 ENCOUNTER — Encounter: Payer: Self-pay | Admitting: Gastroenterology

## 2021-06-03 ENCOUNTER — Telehealth: Payer: Self-pay

## 2021-06-03 ENCOUNTER — Ambulatory Visit (INDEPENDENT_AMBULATORY_CARE_PROVIDER_SITE_OTHER): Payer: PPO

## 2021-06-03 DIAGNOSIS — I480 Paroxysmal atrial fibrillation: Secondary | ICD-10-CM | POA: Diagnosis not present

## 2021-06-03 LAB — CUP PACEART REMOTE DEVICE CHECK
Date Time Interrogation Session: 20230227032027
Implantable Pulse Generator Implant Date: 20200918

## 2021-06-03 NOTE — Telephone Encounter (Signed)
ILR summary report received. Battery status OK. Normal device function. No new symptom, tachy, brady, or pause episodes.  1 new AF episode, duration 27min, poor rate control.  Burden 0%, no OAC.    Pt has history of AF ablation.  Luis [name] was stopped, with notes indicating will consider restarting if AF burden increases.    Current meds include: Metoprolol 50mg  BID.  He states that lately he has been taking only 25 mg in the mornings due to increased fatigue from medication, but he has been doing that for roughly a year.   Pt reports he did wake up last night during the episode, he felt "off"  he thought he was having a reflux attack so he took an OTC Gas X.  He reports he was very active yesterday during the day, he was more active than he is ordinarily.    Pt denies any symptoms today.     Advise I would forward to Dr. [name], anticipate continued monitoring.

## 2021-06-04 DIAGNOSIS — I10 Essential (primary) hypertension: Secondary | ICD-10-CM | POA: Diagnosis not present

## 2021-06-04 DIAGNOSIS — E785 Hyperlipidemia, unspecified: Secondary | ICD-10-CM | POA: Diagnosis not present

## 2021-06-04 DIAGNOSIS — M1990 Unspecified osteoarthritis, unspecified site: Secondary | ICD-10-CM | POA: Diagnosis not present

## 2021-06-05 NOTE — Telephone Encounter (Signed)
Disorganized atrial activity/ afib noted. ?Continue current therapy for now, without change. ?

## 2021-06-10 NOTE — Progress Notes (Signed)
Carelink Summary Report / Loop Recorder 

## 2021-07-08 ENCOUNTER — Ambulatory Visit (INDEPENDENT_AMBULATORY_CARE_PROVIDER_SITE_OTHER): Payer: PPO

## 2021-07-08 DIAGNOSIS — I480 Paroxysmal atrial fibrillation: Secondary | ICD-10-CM

## 2021-07-09 ENCOUNTER — Encounter: Payer: Self-pay | Admitting: Internal Medicine

## 2021-07-09 LAB — CUP PACEART REMOTE DEVICE CHECK
Date Time Interrogation Session: 20230331230738
Implantable Pulse Generator Implant Date: 20200918

## 2021-07-10 NOTE — Telephone Encounter (Signed)
Spoke with patient he had a question about the 07/05/21 report which stated he had one AF episode explained to patient that I had looked in [place] and it was the same episode from his February report patient voiced understanding patient also wanted to move up his apt with Dr. [name] apt scheduled 07/19/21 at 3:30 with JA at [place] office.  ?

## 2021-07-12 DIAGNOSIS — Z96651 Presence of right artificial knee joint: Secondary | ICD-10-CM | POA: Diagnosis not present

## 2021-07-12 DIAGNOSIS — M1712 Unilateral primary osteoarthritis, left knee: Secondary | ICD-10-CM | POA: Diagnosis not present

## 2021-07-12 DIAGNOSIS — M25562 Pain in left knee: Secondary | ICD-10-CM | POA: Diagnosis not present

## 2021-07-19 ENCOUNTER — Encounter (HOSPITAL_BASED_OUTPATIENT_CLINIC_OR_DEPARTMENT_OTHER): Payer: Self-pay | Admitting: Internal Medicine

## 2021-07-19 ENCOUNTER — Ambulatory Visit (INDEPENDENT_AMBULATORY_CARE_PROVIDER_SITE_OTHER): Payer: PPO | Admitting: Internal Medicine

## 2021-07-19 VITALS — BP 130/60 | HR 58 | Ht 70.5 in | Wt 176.6 lb

## 2021-07-19 DIAGNOSIS — I10 Essential (primary) hypertension: Secondary | ICD-10-CM | POA: Diagnosis not present

## 2021-07-19 DIAGNOSIS — I480 Paroxysmal atrial fibrillation: Secondary | ICD-10-CM

## 2021-07-19 NOTE — Patient Instructions (Addendum)
Medication Instructions:  ?Your physician recommends that you continue on your current medications as directed. Please refer to the Current Medication list given to you today. ? ?Labwork: ?None ordered. ? ?Testing/Procedures: ?None ordered. ? ?Follow-Up: ?Your physician wants you to follow-up in: one year with Thompson Grayer, MD  ?You will receive a reminder letter in the mail two months in advance. If you don't receive a letter, please call our office to schedule the follow-up appointment. ? ?Any Other Special Instructions Will Be Listed Below (If Applicable). ? ?If you need a refill on your cardiac medications before your next appointment, please call your pharmacy.  ? ?Important Information About Sugar ? ? ? ? ? ? ? ?

## 2021-07-19 NOTE — Progress Notes (Signed)
? ? ?PCP: Sueanne Margarita, DO ?  ?Primary EP: Dr [name] ? ?Luis [name] is a 76 y.o. male who presents today for routine electrophysiology followup.  Since last being seen in our clinic, the patient reports doing very well.  Today, he denies symptoms of palpitations, chest pain, shortness of breath,  lower extremity edema, dizziness, presyncope, or syncope.  The patient is otherwise without complaint today.  ? ?Past Medical History:  ?Diagnosis Date  ? Allergy   ? SEASONAL  ? Arthritis   ? BACK AND KNEES  ? Complication of anesthesia   ? pt states aspirated following ablation   ? Coronary artery disease   ? Dysrhythmia   ? GERD (gastroesophageal reflux disease)   ? History of kidney stones 07/2006  ? x 1  ? Hyperlipidemia   ? Hypertension   ? Normal coronary arteries 2008  ? Normal LV function  ? Paroxysmal atrial fibrillation ([place])   ? 2008 and 20014, PVI 09/07/12  ? ?Past Surgical History:  ?Procedure Laterality Date  ? ATRIAL ABLATION SURGERY  09/07/2012  ? PVI by Dr [name]  ? ATRIAL FIBRILLATION ABLATION N/A 09/07/2012  ? Procedure: ATRIAL FIBRILLATION ABLATION;  Surgeon: Thompson Grayer, MD;  Location: [facility] CATH LAB;  Service: Cardiovascular;  Laterality: N/A;  ? CARDIAC CATHETERIZATION Left 07/20/2006  ? No significant CAD, EF 60%, continue medical therapy  ? COLONOSCOPY  05/13/2021  ? CYSTOSCOPY W/ RETROGRADES Left 02/28/2019  ? Procedure: CYSTOSCOPY WITH RETROGRADE PYELOGRAM;  Surgeon: Robley Fries, MD;  Location: [facility];  Service: Urology;  Laterality: Left;  ? CYSTOSCOPY/URETEROSCOPY/HOLMIUM LASER/STENT PLACEMENT Left 03/08/2019  ? Procedure: CYSTOSCOPY/URETEROSCOPY/HOLMIUM LASER/STENT PLACEMENT;  Surgeon: Robley Fries, MD;  Location: [facility];  Service: Urology;  Laterality: Left;  1 HR  ? EP IMPLANTABLE DEVICE N/A 01/23/2015  ? Procedure: Loop Recorder Insertion;  Surgeon: Thompson Grayer, MD;  Location: [facility] CV LAB;  Service: Cardiovascular;  Laterality: N/A;  ? implantable  loop recorder placement  12/24/2018  ? previous ILR removed and a new MDT Reveal LINQ2 implanted in its place in office by Dr [name]  ? KNEE ARTHROSCOPY Right   ? TEE WITHOUT CARDIOVERSION N/A 09/06/2012  ? Procedure: TRANSESOPHAGEAL ECHOCARDIOGRAM (TEE);  Surgeon: Pixie Casino, MD;  Location: [facility] ENDOSCOPY;  Service: Cardiovascular;  Laterality: N/A;  ? TOTAL KNEE ARTHROPLASTY Right 12/04/2020  ? Procedure: TOTAL KNEE ARTHROPLASTY;  Surgeon: Paralee Cancel, MD;  Location: [facility];  Service: Orthopedics;  Laterality: Right;  ? UPPER GASTROINTESTINAL ENDOSCOPY    ? ? ?ROS- all systems are reviewed and negatives except as per HPI above ? ?Current Outpatient Medications  ?Medication Sig Dispense Refill  ? acetaminophen (TYLENOL) 500 MG tablet Take 500 mg by mouth at bedtime.    ? aspirin EC 81 MG tablet Take 1 tablet (81 mg total) by mouth in the morning. Resume once daily aspirin after completing 28 days of twice daily dosing. Swallow whole. 30 tablet 11  ? celecoxib (CELEBREX) 200 MG capsule Take 200 mg by mouth daily. Taking in the morning    ? metoprolol tartrate (LOPRESSOR) 50 MG tablet Take 1 tablet by mouth twice daily 180 tablet 3  ? omeprazole (PRILOSEC) 20 MG capsule Take 20 mg by mouth in the morning.    ? rosuvastatin (CRESTOR) 10 MG tablet Take 1 tablet (10 mg total) by mouth daily. (Patient taking differently: Take 10 mg by mouth every evening.) 90 tablet 3  ? ?No current facility-administered medications for this  visit.  ? ? ?Physical Exam: ?Vitals:  ? 07/19/21 1547  ?BP: 130/60  ?Pulse: (!) 58  ?SpO2: 97%  ?Weight: 176 lb 9.6 oz (80.1 kg)  ?Height: 5' 10.5" (1.791 m)  ? ? ?GEN- The patient is well appearing, alert and oriented x 3 today.   ?Head- normocephalic, atraumatic ?Eyes-  Sclera clear, conjunctiva pink ?Ears- hearing intact ?Oropharynx- clear ?Lungs- Clear to ausculation bilaterally, normal work of breathing ?Heart- Regular rate and rhythm, no murmurs, rubs or gallops, PMI not laterally  displaced ?GI- soft, NT, ND, + BS ?Extremities- no clubbing, cyanosis, or edema ? ?Wt Readings from Last 3 Encounters:  ?07/19/21 176 lb 9.6 oz (80.1 kg)  ?05/13/21 183 lb (83 kg)  ?04/29/21 183 lb 4 oz (83.1 kg)  ? ? ?EKG tracing ordered today is personally reviewed and shows sinus ? ?Assessment and Plan: ? ?Paroxysmal atrial fibrillation ?Well controlled post ablation ?Chads2vacs score is 3.  He has declined [place] but will reconsider if his AF burden increases.  He had only 40 minutes of afib 06/02/21 at 11 pm over the past year. ? ?2. HTN ? ?Stable ?No change required today ? ?Return in a year ? ?Thompson Grayer MD, FACC ?07/19/2021 ?3:59 PM ? ? ? ? ?

## 2021-07-23 NOTE — Progress Notes (Signed)
Carelink Summary Report / Loop Recorder 

## 2021-08-08 LAB — CUP PACEART REMOTE DEVICE CHECK
Date Time Interrogation Session: 20230503231529
Implantable Pulse Generator Implant Date: 20200918

## 2021-08-12 ENCOUNTER — Ambulatory Visit (INDEPENDENT_AMBULATORY_CARE_PROVIDER_SITE_OTHER): Payer: PPO

## 2021-08-12 DIAGNOSIS — I480 Paroxysmal atrial fibrillation: Secondary | ICD-10-CM | POA: Diagnosis not present

## 2021-08-13 DIAGNOSIS — T2612XA Burn of cornea and conjunctival sac, left eye, initial encounter: Secondary | ICD-10-CM | POA: Diagnosis not present

## 2021-08-13 DIAGNOSIS — H16142 Punctate keratitis, left eye: Secondary | ICD-10-CM | POA: Diagnosis not present

## 2021-08-15 DIAGNOSIS — T2612XA Burn of cornea and conjunctival sac, left eye, initial encounter: Secondary | ICD-10-CM | POA: Diagnosis not present

## 2021-08-15 DIAGNOSIS — H16142 Punctate keratitis, left eye: Secondary | ICD-10-CM | POA: Diagnosis not present

## 2021-08-20 DIAGNOSIS — D692 Other nonthrombocytopenic purpura: Secondary | ICD-10-CM | POA: Diagnosis not present

## 2021-08-20 DIAGNOSIS — I25118 Atherosclerotic heart disease of native coronary artery with other forms of angina pectoris: Secondary | ICD-10-CM | POA: Diagnosis not present

## 2021-08-20 DIAGNOSIS — I4891 Unspecified atrial fibrillation: Secondary | ICD-10-CM | POA: Diagnosis not present

## 2021-08-20 DIAGNOSIS — E162 Hypoglycemia, unspecified: Secondary | ICD-10-CM | POA: Diagnosis not present

## 2021-08-20 DIAGNOSIS — I10 Essential (primary) hypertension: Secondary | ICD-10-CM | POA: Diagnosis not present

## 2021-08-20 DIAGNOSIS — M1990 Unspecified osteoarthritis, unspecified site: Secondary | ICD-10-CM | POA: Diagnosis not present

## 2021-08-20 DIAGNOSIS — E785 Hyperlipidemia, unspecified: Secondary | ICD-10-CM | POA: Diagnosis not present

## 2021-08-20 DIAGNOSIS — K222 Esophageal obstruction: Secondary | ICD-10-CM | POA: Diagnosis not present

## 2021-08-20 DIAGNOSIS — Z87442 Personal history of urinary calculi: Secondary | ICD-10-CM | POA: Diagnosis not present

## 2021-08-20 DIAGNOSIS — R7301 Impaired fasting glucose: Secondary | ICD-10-CM | POA: Diagnosis not present

## 2021-08-20 DIAGNOSIS — D6869 Other thrombophilia: Secondary | ICD-10-CM | POA: Diagnosis not present

## 2021-08-20 DIAGNOSIS — Z86010 Personal history of colonic polyps: Secondary | ICD-10-CM | POA: Diagnosis not present

## 2021-08-27 NOTE — Progress Notes (Signed)
Carelink Summary Report / Loop Recorder 

## 2021-08-28 ENCOUNTER — Encounter: Payer: PPO | Admitting: Internal Medicine

## 2021-09-16 ENCOUNTER — Ambulatory Visit (INDEPENDENT_AMBULATORY_CARE_PROVIDER_SITE_OTHER): Payer: PPO

## 2021-09-16 DIAGNOSIS — I480 Paroxysmal atrial fibrillation: Secondary | ICD-10-CM

## 2021-09-18 LAB — CUP PACEART REMOTE DEVICE CHECK
Date Time Interrogation Session: 20230605231534
Implantable Pulse Generator Implant Date: 20200918

## 2021-10-03 ENCOUNTER — Other Ambulatory Visit: Payer: Self-pay | Admitting: Cardiovascular Disease

## 2021-10-17 LAB — CUP PACEART REMOTE DEVICE CHECK
Date Time Interrogation Session: 20230708230813
Implantable Pulse Generator Implant Date: 20200918

## 2021-10-21 ENCOUNTER — Ambulatory Visit (INDEPENDENT_AMBULATORY_CARE_PROVIDER_SITE_OTHER): Payer: PPO

## 2021-10-21 DIAGNOSIS — I480 Paroxysmal atrial fibrillation: Secondary | ICD-10-CM | POA: Diagnosis not present

## 2021-10-22 DIAGNOSIS — M25562 Pain in left knee: Secondary | ICD-10-CM | POA: Diagnosis not present

## 2021-11-06 DIAGNOSIS — Z87442 Personal history of urinary calculi: Secondary | ICD-10-CM | POA: Diagnosis not present

## 2021-11-08 ENCOUNTER — Ambulatory Visit: Payer: PPO | Admitting: Cardiovascular Disease

## 2021-11-08 ENCOUNTER — Encounter: Payer: Self-pay | Admitting: Cardiovascular Disease

## 2021-11-08 DIAGNOSIS — E785 Hyperlipidemia, unspecified: Secondary | ICD-10-CM | POA: Diagnosis not present

## 2021-11-08 DIAGNOSIS — I10 Essential (primary) hypertension: Secondary | ICD-10-CM | POA: Diagnosis not present

## 2021-11-08 DIAGNOSIS — I480 Paroxysmal atrial fibrillation: Secondary | ICD-10-CM

## 2021-11-08 NOTE — Assessment & Plan Note (Signed)
History of A-fib ablation by Dr. [name] in 2014 with minimal recurrence.  He is currently not on oral anticoagulation. The CHA2DSVASC2 score is  3 .  He and Dr. [name] have discussed this.

## 2021-11-08 NOTE — Patient Instructions (Signed)
Medication Instructions:  No Changes In Medications at this time.  *If you need a refill on your cardiac medications before your next appointment, please call your pharmacy*  Lab Work: None Ordered At This Time.  If you have labs (blood work) drawn today and your tests are completely normal, you will receive your results only by: [place] (if you have MyChart) OR A paper copy in the mail If you have any lab test that is abnormal or we need to change your treatment, we will call you to review the results.  Testing/Procedures: None Ordered At This Time.   Follow-Up: At [facility], you and your health needs are our priority.  As part of our continuing mission to provide you with exceptional heart care, we have created designated Provider Care Teams.  These Care Teams include your primary Cardiologist (physician) and Advanced Practice Providers (APPs -  Physician Assistants and Nurse Practitioners) who all work together to provide you with the care you need, when you need it.  Your next appointment:   1 year(s)  The format for your next appointment:   In Person  Provider:   Quay Burow, MD

## 2021-11-08 NOTE — Progress Notes (Signed)
11/08/2021 Luis [name]   June 30, 1945  [id number]  Primary Physician Sueanne Margarita, DO Primary Cardiologist: Lorretta Harp MD Garret Reddish, [place], [place]  HPI:  Luis [name] is a 76 y.o.  thin and fit-appearing, married Caucasian male, father of 31, grandfather to 2 grandchildren who I last office 4//6/22.  He has a history of paroxysmal atrial fibrillation maintaining sinus rhythm when I saw him in December. He has been evaluated by [name] at [facility] remotely back in May 2008 and was cath'd by Dr. [name] the month before revealing noncritical CAD with normal LV function. He does have a 35-pack-year history of tobacco abuse having quit August 1996. His other problems include history of hypertension, mild hyperlipidemia and family history of heart disease with a father who had an MI at age 78 and a brother who had stent placed at age 15. He did get occasional palpitations when I saw him back in December. He has had 2 episodes of symptomatic A-fib, the last of which occurred on June 19, 2012, which resulted in presyncope. He noticed this while he was playing golf. When he presented to the ER his heart rate was 144. He was placed on IV diltiazem and ultimately converted to sinus rhythm and was discharged home on flecainide 50 mg p.o. b.i.d. Because his CHADS2 score was 1 it was elected to keep him on aspirin.he underwent atrial fibrillation ablation by [name] on 09/07/12 with an excellent clinical result. He's had no recurrent episodes appear PAF. His Flecainide was discontinued. He is on Xarelto oral anticoagulation. He denies chest pain or shortness of breath. He had an abdominal duplex that showed no evidence of aneurysm.  He had an implantable loop recorder placed by Dr. [name] in October 2016.[name] He's had one episode of breakthrough PAF since his ablation 3 years ago. Since I saw him a year ago his A. fib burden is 0. He's had no breakthrough episodes remains on Xarelto oral  anticoagulation.     Since I spoke to him 1 year ago he continues to do well.  He did see Dr. [name] 07/19/2021.  He has had minimal A-fib recurrence since his ablation 9 years ago.  He is currently not on oral anticoagulation.  He denies chest pain or shortness of breath.  He had a right total knee replacement by Dr. Alvan Dame several months ago.   Current Meds  Medication Sig   acetaminophen (TYLENOL) 500 MG tablet Take 500 mg by mouth at bedtime.   aspirin EC 81 MG tablet Take 1 tablet (81 mg total) by mouth in the morning. Resume once daily aspirin after completing 28 days of twice daily dosing. Swallow whole.   celecoxib (CELEBREX) 200 MG capsule Take 200 mg by mouth daily. Taking in the morning   metoprolol tartrate (LOPRESSOR) 50 MG tablet Take 1 tablet by mouth twice daily   omeprazole (PRILOSEC) 20 MG capsule Take 20 mg by mouth in the morning.   rosuvastatin (CRESTOR) 10 MG tablet Take 1 tablet by mouth once daily     No Known Allergies  Social History   Socioeconomic History   Marital status: Married    Spouse name: Not on file   Number of children: 4   Years of education: Not on file   Highest education level: Not on file  Occupational History   Occupation: retired  Tobacco Use   Smoking status: Former    Years: 35.00    Types: Cigarettes  Quit date: 11/27/1994    Years since quitting: 26.9   Smokeless tobacco: Never  Vaping Use   Vaping Use: Never used  Substance and Sexual Activity   Alcohol use: Yes    Alcohol/week: 7.0 standard drinks of alcohol    Types: 7 Standard drinks or equivalent per week    Comment: 3-4 oz daily 1/2 drinks per night   Drug use: No   Sexual activity: Not on file  Other Topics Concern   Not on file  Social History Narrative   Lives in [place] with spouse.   Retired Electrical engineer   Social Determinants of Radio broadcast assistant Strain: Not on file  Food Insecurity: Not on file  Transportation Needs: Not on file   Physical Activity: Not on file  Stress: Not on file  Social Connections: Not on file  Intimate Partner Violence: Not on file     Review of Systems: General: negative for chills, fever, night sweats or weight changes.  Cardiovascular: negative for chest pain, dyspnea on exertion, edema, orthopnea, palpitations, paroxysmal nocturnal dyspnea or shortness of breath Dermatological: negative for rash Respiratory: negative for cough or wheezing Urologic: negative for hematuria Abdominal: negative for nausea, vomiting, diarrhea, bright red blood per rectum, melena, or hematemesis Neurologic: negative for visual changes, syncope, or dizziness All other systems reviewed and are otherwise negative except as noted above.    Blood pressure (!) 144/72, pulse (!) 57, height '5\' 11"'$  (1.803 m), weight 173 lb 12.8 oz (78.8 kg).  General appearance: alert and no distress Neck: no adenopathy, no carotid bruit, no JVD, supple, symmetrical, trachea midline, and thyroid not enlarged, symmetric, no tenderness/mass/nodules Lungs: clear to auscultation bilaterally Heart: regular rate and rhythm, S1, S2 normal, no murmur, click, rub or gallop Extremities: extremities normal, atraumatic, no cyanosis or edema Pulses: 2+ and symmetric Skin: Skin color, texture, turgor normal. No rashes or lesions Neurologic: Grossly normal  EKG not performed today  ASSESSMENT AND PLAN:   Essential hypertension History of essential hypertension a blood pressure measured today at 144/72.  He is on metoprolol.  Paroxysmal atrial fibrillation- RFA June 2014 History of A-fib ablation by Dr. [name] in 2014 with minimal recurrence.  He is currently not on oral anticoagulation. The CHA2DSVASC2 score is  3 .  He and Dr. [name] have discussed this.  Dyslipidemia History of dyslipidemia on statin therapy with lipid profile performed 11//22 revealing total cholesterol 140, LDL 70 and HDL 53.     Lorretta Harp MD  [facility], [facility] 11/08/2021 9:44 AM

## 2021-11-08 NOTE — Assessment & Plan Note (Signed)
History of essential hypertension a blood pressure measured today at 144/72.  He is on metoprolol.

## 2021-11-08 NOTE — Assessment & Plan Note (Signed)
History of dyslipidemia on statin therapy with lipid profile performed 11//22 revealing total cholesterol 140, LDL 70 and HDL 53.

## 2021-11-18 NOTE — Progress Notes (Signed)
Carelink Summary Report / Loop Recorder 

## 2021-11-19 DIAGNOSIS — H43393 Other vitreous opacities, bilateral: Secondary | ICD-10-CM | POA: Diagnosis not present

## 2021-11-19 DIAGNOSIS — G43B0 Ophthalmoplegic migraine, not intractable: Secondary | ICD-10-CM | POA: Diagnosis not present

## 2021-11-19 DIAGNOSIS — H31092 Other chorioretinal scars, left eye: Secondary | ICD-10-CM | POA: Diagnosis not present

## 2021-11-19 DIAGNOSIS — H2513 Age-related nuclear cataract, bilateral: Secondary | ICD-10-CM | POA: Diagnosis not present

## 2021-11-25 ENCOUNTER — Ambulatory Visit (INDEPENDENT_AMBULATORY_CARE_PROVIDER_SITE_OTHER): Payer: PPO

## 2021-11-25 DIAGNOSIS — I480 Paroxysmal atrial fibrillation: Secondary | ICD-10-CM | POA: Diagnosis not present

## 2021-11-26 ENCOUNTER — Telehealth: Payer: Self-pay | Admitting: Internal Medicine

## 2021-11-26 NOTE — Telephone Encounter (Signed)
Attempted to return call. No answer.

## 2021-11-26 NOTE — Telephone Encounter (Signed)
Attempted to return phone call. No answer, unable to leave VM as phone rings continuously.

## 2021-11-26 NOTE — Telephone Encounter (Signed)
Patient reports of palpations last night and some today. Denies chest pain, shortness of breath, lightheadedness or other associating symptoms. I checked ILR report and no episodes were triggered. Recommeded patient call tomorrow 8/23 to see if any alerts were triggered so then we could come up with appropriate plan. Patient is agreeable and direct number provided.

## 2021-11-26 NOTE — Telephone Encounter (Signed)
Pt called stating he has been getting some unusual readings from his loop recorder.

## 2021-11-26 NOTE — Telephone Encounter (Signed)
Pt returning nurse's call. Please advise

## 2021-11-27 ENCOUNTER — Encounter: Payer: Self-pay | Admitting: Internal Medicine

## 2021-11-27 LAB — CUP PACEART REMOTE DEVICE CHECK
Date Time Interrogation Session: 20230820231550
Implantable Pulse Generator Implant Date: 20200918

## 2021-11-27 NOTE — Telephone Encounter (Signed)
Pt lmovm calling patient back about his transmission for today. He would like to see if he is in A-fib or not. His phone number is [phone].

## 2021-11-27 NOTE — Telephone Encounter (Signed)
Informed patient that there were no episodes of AF noted on his report, patient voiced understanding.

## 2021-12-23 NOTE — Progress Notes (Signed)
Carelink Summary Report / Loop Recorder 

## 2021-12-30 ENCOUNTER — Ambulatory Visit (INDEPENDENT_AMBULATORY_CARE_PROVIDER_SITE_OTHER): Payer: PPO

## 2021-12-30 DIAGNOSIS — I480 Paroxysmal atrial fibrillation: Secondary | ICD-10-CM

## 2021-12-31 LAB — CUP PACEART REMOTE DEVICE CHECK
Date Time Interrogation Session: 20230922230338
Implantable Pulse Generator Implant Date: 20200918

## 2022-01-16 NOTE — Progress Notes (Signed)
Carelink Summary Report / Loop Recorder 

## 2022-01-17 DIAGNOSIS — J0100 Acute maxillary sinusitis, unspecified: Secondary | ICD-10-CM | POA: Diagnosis not present

## 2022-01-17 DIAGNOSIS — R5383 Other fatigue: Secondary | ICD-10-CM | POA: Diagnosis not present

## 2022-01-17 DIAGNOSIS — I10 Essential (primary) hypertension: Secondary | ICD-10-CM | POA: Diagnosis not present

## 2022-01-17 DIAGNOSIS — R0981 Nasal congestion: Secondary | ICD-10-CM | POA: Diagnosis not present

## 2022-01-17 DIAGNOSIS — J029 Acute pharyngitis, unspecified: Secondary | ICD-10-CM | POA: Diagnosis not present

## 2022-01-17 DIAGNOSIS — Z1152 Encounter for screening for COVID-19: Secondary | ICD-10-CM | POA: Diagnosis not present

## 2022-02-03 ENCOUNTER — Ambulatory Visit (INDEPENDENT_AMBULATORY_CARE_PROVIDER_SITE_OTHER): Payer: PPO

## 2022-02-03 DIAGNOSIS — I480 Paroxysmal atrial fibrillation: Secondary | ICD-10-CM | POA: Diagnosis not present

## 2022-02-03 LAB — CUP PACEART REMOTE DEVICE CHECK
Date Time Interrogation Session: 20231025231616
Implantable Pulse Generator Implant Date: 20200918

## 2022-02-12 DIAGNOSIS — R7989 Other specified abnormal findings of blood chemistry: Secondary | ICD-10-CM | POA: Diagnosis not present

## 2022-02-12 DIAGNOSIS — E785 Hyperlipidemia, unspecified: Secondary | ICD-10-CM | POA: Diagnosis not present

## 2022-02-12 DIAGNOSIS — I10 Essential (primary) hypertension: Secondary | ICD-10-CM | POA: Diagnosis not present

## 2022-02-12 DIAGNOSIS — R7301 Impaired fasting glucose: Secondary | ICD-10-CM | POA: Diagnosis not present

## 2022-02-12 DIAGNOSIS — Z125 Encounter for screening for malignant neoplasm of prostate: Secondary | ICD-10-CM | POA: Diagnosis not present

## 2022-02-14 DIAGNOSIS — M25562 Pain in left knee: Secondary | ICD-10-CM | POA: Diagnosis not present

## 2022-02-14 DIAGNOSIS — M1712 Unilateral primary osteoarthritis, left knee: Secondary | ICD-10-CM | POA: Diagnosis not present

## 2022-02-19 DIAGNOSIS — M1990 Unspecified osteoarthritis, unspecified site: Secondary | ICD-10-CM | POA: Diagnosis not present

## 2022-02-19 DIAGNOSIS — I10 Essential (primary) hypertension: Secondary | ICD-10-CM | POA: Diagnosis not present

## 2022-02-19 DIAGNOSIS — I25118 Atherosclerotic heart disease of native coronary artery with other forms of angina pectoris: Secondary | ICD-10-CM | POA: Diagnosis not present

## 2022-02-19 DIAGNOSIS — I4891 Unspecified atrial fibrillation: Secondary | ICD-10-CM | POA: Diagnosis not present

## 2022-02-19 DIAGNOSIS — D692 Other nonthrombocytopenic purpura: Secondary | ICD-10-CM | POA: Diagnosis not present

## 2022-02-19 DIAGNOSIS — E785 Hyperlipidemia, unspecified: Secondary | ICD-10-CM | POA: Diagnosis not present

## 2022-02-19 DIAGNOSIS — Z1331 Encounter for screening for depression: Secondary | ICD-10-CM | POA: Diagnosis not present

## 2022-02-19 DIAGNOSIS — R82998 Other abnormal findings in urine: Secondary | ICD-10-CM | POA: Diagnosis not present

## 2022-02-19 DIAGNOSIS — R972 Elevated prostate specific antigen [PSA]: Secondary | ICD-10-CM | POA: Diagnosis not present

## 2022-02-19 DIAGNOSIS — Z87442 Personal history of urinary calculi: Secondary | ICD-10-CM | POA: Diagnosis not present

## 2022-02-19 DIAGNOSIS — Z0000 Encounter for general adult medical examination without abnormal findings: Secondary | ICD-10-CM | POA: Diagnosis not present

## 2022-02-19 DIAGNOSIS — Z23 Encounter for immunization: Secondary | ICD-10-CM | POA: Diagnosis not present

## 2022-02-19 DIAGNOSIS — D6869 Other thrombophilia: Secondary | ICD-10-CM | POA: Diagnosis not present

## 2022-03-05 DIAGNOSIS — R310 Gross hematuria: Secondary | ICD-10-CM | POA: Diagnosis not present

## 2022-03-08 NOTE — Progress Notes (Signed)
Carelink Summary Report / Loop Recorder 

## 2022-03-10 ENCOUNTER — Ambulatory Visit (INDEPENDENT_AMBULATORY_CARE_PROVIDER_SITE_OTHER): Payer: PPO

## 2022-03-10 ENCOUNTER — Other Ambulatory Visit: Payer: Self-pay | Admitting: Internal Medicine

## 2022-03-10 DIAGNOSIS — I480 Paroxysmal atrial fibrillation: Secondary | ICD-10-CM

## 2022-03-11 LAB — CUP PACEART REMOTE DEVICE CHECK
Date Time Interrogation Session: 20231203232021
Implantable Pulse Generator Implant Date: 20200918

## 2022-03-21 DIAGNOSIS — R310 Gross hematuria: Secondary | ICD-10-CM | POA: Diagnosis not present

## 2022-03-21 DIAGNOSIS — R319 Hematuria, unspecified: Secondary | ICD-10-CM | POA: Diagnosis not present

## 2022-03-28 ENCOUNTER — Other Ambulatory Visit (HOSPITAL_COMMUNITY): Payer: Self-pay | Admitting: Internal Medicine

## 2022-03-28 ENCOUNTER — Ambulatory Visit (HOSPITAL_COMMUNITY)
Admission: RE | Admit: 2022-03-28 | Discharge: 2022-03-28 | Disposition: A | Discharge status: Home / Self Care | Payer: PPO | Source: Ambulatory Visit | Attending: Internal Medicine | Admitting: Internal Medicine

## 2022-03-28 DIAGNOSIS — R600 Localized edema: Secondary | ICD-10-CM | POA: Diagnosis not present

## 2022-03-28 DIAGNOSIS — I82401 Acute embolism and thrombosis of unspecified deep veins of right lower extremity: Secondary | ICD-10-CM | POA: Diagnosis not present

## 2022-03-28 DIAGNOSIS — M25569 Pain in unspecified knee: Secondary | ICD-10-CM | POA: Diagnosis not present

## 2022-03-28 DIAGNOSIS — Z96659 Presence of unspecified artificial knee joint: Secondary | ICD-10-CM | POA: Diagnosis not present

## 2022-03-28 DIAGNOSIS — G8929 Other chronic pain: Secondary | ICD-10-CM | POA: Diagnosis not present

## 2022-04-01 DIAGNOSIS — L570 Actinic keratosis: Secondary | ICD-10-CM | POA: Diagnosis not present

## 2022-04-01 DIAGNOSIS — Z85828 Personal history of other malignant neoplasm of skin: Secondary | ICD-10-CM | POA: Diagnosis not present

## 2022-04-01 DIAGNOSIS — L820 Inflamed seborrheic keratosis: Secondary | ICD-10-CM | POA: Diagnosis not present

## 2022-04-01 DIAGNOSIS — D485 Neoplasm of uncertain behavior of skin: Secondary | ICD-10-CM | POA: Diagnosis not present

## 2022-04-01 DIAGNOSIS — D0471 Carcinoma in situ of skin of right lower limb, including hip: Secondary | ICD-10-CM | POA: Diagnosis not present

## 2022-04-01 DIAGNOSIS — L821 Other seborrheic keratosis: Secondary | ICD-10-CM | POA: Diagnosis not present

## 2022-04-11 DIAGNOSIS — R310 Gross hematuria: Secondary | ICD-10-CM | POA: Diagnosis not present

## 2022-04-11 DIAGNOSIS — R82998 Other abnormal findings in urine: Secondary | ICD-10-CM | POA: Diagnosis not present

## 2022-04-14 ENCOUNTER — Ambulatory Visit (INDEPENDENT_AMBULATORY_CARE_PROVIDER_SITE_OTHER): Payer: PPO

## 2022-04-14 DIAGNOSIS — I480 Paroxysmal atrial fibrillation: Secondary | ICD-10-CM

## 2022-04-15 LAB — CUP PACEART REMOTE DEVICE CHECK
Date Time Interrogation Session: 20240107231835
Implantable Pulse Generator Implant Date: 20200918

## 2022-04-16 DIAGNOSIS — R059 Cough, unspecified: Secondary | ICD-10-CM | POA: Diagnosis not present

## 2022-04-16 DIAGNOSIS — U071 COVID-19: Secondary | ICD-10-CM | POA: Diagnosis not present

## 2022-04-16 DIAGNOSIS — R519 Headache, unspecified: Secondary | ICD-10-CM | POA: Diagnosis not present

## 2022-04-16 DIAGNOSIS — R638 Other symptoms and signs concerning food and fluid intake: Secondary | ICD-10-CM | POA: Diagnosis not present

## 2022-04-16 DIAGNOSIS — M791 Myalgia, unspecified site: Secondary | ICD-10-CM | POA: Diagnosis not present

## 2022-04-16 DIAGNOSIS — Z1152 Encounter for screening for COVID-19: Secondary | ICD-10-CM | POA: Diagnosis not present

## 2022-04-16 DIAGNOSIS — R110 Nausea: Secondary | ICD-10-CM | POA: Diagnosis not present

## 2022-04-18 NOTE — Progress Notes (Signed)
Carelink Summary Report / Loop Recorder

## 2022-05-19 ENCOUNTER — Ambulatory Visit: Payer: PPO

## 2022-05-19 DIAGNOSIS — I480 Paroxysmal atrial fibrillation: Secondary | ICD-10-CM

## 2022-05-19 DIAGNOSIS — K219 Gastro-esophageal reflux disease without esophagitis: Secondary | ICD-10-CM | POA: Diagnosis not present

## 2022-05-19 DIAGNOSIS — R0981 Nasal congestion: Secondary | ICD-10-CM | POA: Diagnosis not present

## 2022-05-19 DIAGNOSIS — U071 COVID-19: Secondary | ICD-10-CM | POA: Diagnosis not present

## 2022-05-19 DIAGNOSIS — J029 Acute pharyngitis, unspecified: Secondary | ICD-10-CM | POA: Diagnosis not present

## 2022-05-20 LAB — CUP PACEART REMOTE DEVICE CHECK
Date Time Interrogation Session: 20240209231219
Implantable Pulse Generator Implant Date: 20200918

## 2022-05-27 NOTE — Progress Notes (Signed)
Carelink Summary Report / Loop Recorder 

## 2022-06-16 DIAGNOSIS — M1712 Unilateral primary osteoarthritis, left knee: Secondary | ICD-10-CM | POA: Diagnosis not present

## 2022-06-23 ENCOUNTER — Ambulatory Visit (INDEPENDENT_AMBULATORY_CARE_PROVIDER_SITE_OTHER): Payer: PPO

## 2022-06-23 DIAGNOSIS — I480 Paroxysmal atrial fibrillation: Secondary | ICD-10-CM | POA: Diagnosis not present

## 2022-06-25 LAB — CUP PACEART REMOTE DEVICE CHECK
Date Time Interrogation Session: 20240317231815
Implantable Pulse Generator Implant Date: 20200918

## 2022-07-03 NOTE — Progress Notes (Signed)
Carelink Summary Report / Loop Recorder 

## 2022-07-14 ENCOUNTER — Other Ambulatory Visit: Payer: Self-pay | Admitting: *Deleted

## 2022-07-14 MED ORDER — METOPROLOL TARTRATE 50 MG PO TABS: 50.0000 mg | ORAL_TABLET | Freq: Two times a day (BID) | ORAL | 0 refills | Status: DC

## 2022-07-15 ENCOUNTER — Encounter: Payer: Self-pay | Admitting: Cardiovascular Disease

## 2022-07-15 MED ORDER — METOPROLOL TARTRATE 50 MG PO TABS: 50.0000 mg | ORAL_TABLET | Freq: Two times a day (BID) | ORAL | 2 refills | Status: DC

## 2022-07-28 ENCOUNTER — Ambulatory Visit (INDEPENDENT_AMBULATORY_CARE_PROVIDER_SITE_OTHER): Payer: PPO

## 2022-07-28 DIAGNOSIS — I480 Paroxysmal atrial fibrillation: Secondary | ICD-10-CM | POA: Diagnosis not present

## 2022-07-29 LAB — CUP PACEART REMOTE DEVICE CHECK
Date Time Interrogation Session: 20240419230828
Implantable Pulse Generator Implant Date: 20200918

## 2022-08-06 NOTE — Progress Notes (Deleted)
Carelink Summary Report / Loop Recorder 

## 2022-08-06 NOTE — Progress Notes (Signed)
Carelink Summary Report / Loop Recorder 

## 2022-08-15 NOTE — Progress Notes (Unsigned)
    Electrophysiology Office Note Date: 08/18/2022  ID:  Luis, [name] 1945/04/17, MRN [id number]  PCP: Charlane Ferretti, DO Primary Cardiologist: [name], MD Electrophysiologist: Maurice Small, MD   CC: ILR follow-up  Luis [name] is a 77 y.o. male with h/o AF s/p ablation in 2014 with rare reoccurrence, HTN, and desire to avoid [facility] seen today for routine electrophysiology followup. Since last being seen in our clinic the patient reports doing very well.  he denies chest pain, palpitations, dyspnea, PND, orthopnea, nausea, vomiting, dizziness, syncope, edema, weight gain, or early satiety. .  Device History: Medtronic loop recorder implanted 01/2015, new loop 12/2018 for Atrial fibrillation  Current Outpatient Medications  Medication Instructions   acetaminophen (TYLENOL) 500 mg, Oral, Nightly   aspirin EC 81 mg, Oral, Every morning, Resume once daily aspirin after completing 28 days of twice daily dosing. Swallow whole.   celecoxib (CELEBREX) 200 mg, Oral, Daily, Taking in the morning   metoprolol tartrate (LOPRESSOR) 50 mg, Oral, 2 times daily   omeprazole (PRILOSEC) 20 mg, Oral, Every morning   rosuvastatin (CRESTOR) 10 MG tablet Take 1 tablet by mouth once daily    Physical Exam: Vitals:   08/18/22 0850  BP: (!) 142/80  Pulse: 64  SpO2: 99%  Weight: 172 lb (78 kg)  Height: 5\' 11"  (1.803 m)     GEN- The patient is well appearing, alert and oriented x 3 today.   Lungs- Clear to ausculation bilaterally, normal work of breathing.   Cardiac - Regular rate and rhythm, no murmurs, rubs or gallops ILR pocket well healed Extremities- no clubbing, cyanosis, or edema   Studies Reviewed: Previous EP office notes  ILR Interrogation- reviewed in detail today,  See PACEART report  EKG is ordered today. Personal review shows NSR at 64 bpm  Assessment and Plan:  Atrial fibrillation s/p Medtronic Loop recorder S/p ablation 2014 Normal device function See  Pace Art report No changes today CHA2DS2VASc is at least 3.  He has declined OAC. Will reconsider if his AF burden increases  HTN Stable on current regimen   H/o Hematuria In setting of kidney stones Was much worse on Xarelto and one of the main reasons he wishes to avoid chronic OAC.   Disposition:   Follow up with Dr. [name] in 6 months. Soon with issues.    Dustin Flock, PA-C  08/18/2022 9:06 AM  [facility] [address] [place] [address] [phone] (office) [phone] (fax)

## 2022-08-18 ENCOUNTER — Ambulatory Visit: Payer: PPO | Attending: Student | Admitting: Student

## 2022-08-18 ENCOUNTER — Encounter: Payer: Self-pay | Admitting: Student

## 2022-08-18 VITALS — BP 142/80 | HR 64 | Ht 71.0 in | Wt 172.0 lb

## 2022-08-18 DIAGNOSIS — I480 Paroxysmal atrial fibrillation: Secondary | ICD-10-CM | POA: Diagnosis not present

## 2022-08-18 DIAGNOSIS — I10 Essential (primary) hypertension: Secondary | ICD-10-CM

## 2022-08-18 NOTE — Patient Instructions (Signed)
Medication Instructions:  Your physician recommends that you continue on your current medications as directed. Please refer to the Current Medication list given to you today.  *If you need a refill on your cardiac medications before your next appointment, please call your pharmacy*  Lab Work: None ordered If you have labs (blood work) drawn today and your tests are completely normal, you will receive your results only by: MyChart Message (if you have MyChart) OR A paper copy in the mail If you have any lab test that is abnormal or we need to change your treatment, we will call you to review the results.  Follow-Up: At [facility], you and your health needs are our priority.  As part of our continuing mission to provide you with exceptional heart care, we have created designated Provider Care Teams.  These Care Teams include your primary Cardiologist (physician) and Advanced Practice Providers (APPs -  Physician Assistants and Nurse Practitioners) who all work together to provide you with the care you need, when you need it.  Your next appointment:   6 month(s)  Provider:   Augustus Mealor, MD  

## 2022-08-25 ENCOUNTER — Other Ambulatory Visit (HOSPITAL_BASED_OUTPATIENT_CLINIC_OR_DEPARTMENT_OTHER): Payer: Self-pay

## 2022-08-25 ENCOUNTER — Encounter (HOSPITAL_BASED_OUTPATIENT_CLINIC_OR_DEPARTMENT_OTHER): Payer: Self-pay

## 2022-08-25 ENCOUNTER — Emergency Department (HOSPITAL_BASED_OUTPATIENT_CLINIC_OR_DEPARTMENT_OTHER)
Admission: EM | Admit: 2022-08-25 | Discharge: 2022-08-25 | Disposition: A | Discharge status: Home / Self Care | Payer: PPO | Attending: Emergency Medicine | Admitting: Emergency Medicine

## 2022-08-25 ENCOUNTER — Emergency Department (HOSPITAL_BASED_OUTPATIENT_CLINIC_OR_DEPARTMENT_OTHER): Payer: PPO | Admitting: Radiology

## 2022-08-25 ENCOUNTER — Other Ambulatory Visit: Payer: Self-pay

## 2022-08-25 DIAGNOSIS — S80852A Superficial foreign body, left lower leg, initial encounter: Secondary | ICD-10-CM | POA: Diagnosis not present

## 2022-08-25 DIAGNOSIS — Z79899 Other long term (current) drug therapy: Secondary | ICD-10-CM | POA: Diagnosis not present

## 2022-08-25 DIAGNOSIS — Z7982 Long term (current) use of aspirin: Secondary | ICD-10-CM | POA: Insufficient documentation

## 2022-08-25 DIAGNOSIS — M795 Residual foreign body in soft tissue: Secondary | ICD-10-CM | POA: Diagnosis not present

## 2022-08-25 DIAGNOSIS — Z23 Encounter for immunization: Secondary | ICD-10-CM | POA: Insufficient documentation

## 2022-08-25 DIAGNOSIS — Z041 Encounter for examination and observation following transport accident: Secondary | ICD-10-CM | POA: Diagnosis not present

## 2022-08-25 MED ORDER — CIPROFLOXACIN HCL 500 MG PO TABS
500.0000 mg | ORAL_TABLET | Freq: Two times a day (BID) | ORAL | 0 refills | Status: AC
  Filled 2022-08-25: qty 14, 7d supply, fill #0

## 2022-08-25 MED ORDER — ALBUTEROL SULFATE (2.5 MG/3ML) 0.083% IN NEBU: 5.0000 mg | INHALATION_SOLUTION | Freq: Once | RESPIRATORY_TRACT | Status: DC

## 2022-08-25 MED ORDER — TETANUS-DIPHTH-ACELL PERTUSSIS 5-2.5-18.5 LF-MCG/0.5 IM SUSY
0.5000 mL | PREFILLED_SYRINGE | Freq: Once | INTRAMUSCULAR | Status: AC
  Administered 2022-08-25: 0.5 mL via INTRAMUSCULAR
  Filled 2022-08-25: qty 0.5

## 2022-08-25 NOTE — Discharge Instructions (Signed)
You were seen in the emergency department for a leg injury. Your xrays show signs of metal in both lower legs. Givne that removal of these is typically rather invasive with significant risk, I have instead sent a course of antibiotics to your pharmacy to take to prevent infection. If you would prefer, you make follow up with your orthopedic surgeon to discuss any alternative treatments that they may recommend. If you begin to experience any significant redness, drainage or discharge, follow up with your primary care provider or return to the ED for further evaluation.
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
## 2022-08-28 ENCOUNTER — Ambulatory Visit (INDEPENDENT_AMBULATORY_CARE_PROVIDER_SITE_OTHER): Payer: PPO

## 2022-08-28 DIAGNOSIS — M795 Residual foreign body in soft tissue: Secondary | ICD-10-CM | POA: Diagnosis not present

## 2022-08-28 DIAGNOSIS — W3300XA Accidental discharge of unspecified larger firearm, initial encounter: Secondary | ICD-10-CM | POA: Diagnosis not present

## 2022-08-28 DIAGNOSIS — I480 Paroxysmal atrial fibrillation: Secondary | ICD-10-CM

## 2022-08-28 DIAGNOSIS — N481 Balanitis: Secondary | ICD-10-CM | POA: Diagnosis not present

## 2022-08-28 DIAGNOSIS — S8990XA Unspecified injury of unspecified lower leg, initial encounter: Secondary | ICD-10-CM | POA: Diagnosis not present

## 2022-08-28 LAB — CUP PACEART REMOTE DEVICE CHECK
Date Time Interrogation Session: 20240522230909
Implantable Pulse Generator Implant Date: 20200918

## 2022-09-03 NOTE — Progress Notes (Signed)
Carelink Summary Report / Loop Recorder 

## 2022-09-16 DIAGNOSIS — N390 Urinary tract infection, site not specified: Secondary | ICD-10-CM | POA: Diagnosis not present

## 2022-09-17 NOTE — Progress Notes (Signed)
Carelink Summary Report / Loop Recorder 

## 2022-09-23 ENCOUNTER — Other Ambulatory Visit: Payer: Self-pay | Admitting: *Deleted

## 2022-09-23 MED ORDER — ROSUVASTATIN CALCIUM 10 MG PO TABS: 10.0000 mg | ORAL_TABLET | Freq: Every day | ORAL | 0 refills | Status: DC

## 2022-09-26 DIAGNOSIS — Z87442 Personal history of urinary calculi: Secondary | ICD-10-CM | POA: Diagnosis not present

## 2022-09-26 DIAGNOSIS — R8271 Bacteriuria: Secondary | ICD-10-CM | POA: Diagnosis not present

## 2022-09-26 DIAGNOSIS — R310 Gross hematuria: Secondary | ICD-10-CM | POA: Diagnosis not present

## 2022-09-30 ENCOUNTER — Ambulatory Visit: Payer: PPO

## 2022-09-30 DIAGNOSIS — I480 Paroxysmal atrial fibrillation: Secondary | ICD-10-CM | POA: Diagnosis not present

## 2022-09-30 LAB — CUP PACEART REMOTE DEVICE CHECK
Date Time Interrogation Session: 20240624231041
Implantable Pulse Generator Implant Date: 20200918

## 2022-10-22 DIAGNOSIS — M1712 Unilateral primary osteoarthritis, left knee: Secondary | ICD-10-CM | POA: Diagnosis not present

## 2022-10-22 NOTE — Progress Notes (Signed)
 Carelink Summary Report / Loop Recorder 

## 2022-11-03 ENCOUNTER — Ambulatory Visit (INDEPENDENT_AMBULATORY_CARE_PROVIDER_SITE_OTHER): Payer: PPO

## 2022-11-03 DIAGNOSIS — I480 Paroxysmal atrial fibrillation: Secondary | ICD-10-CM

## 2022-11-03 LAB — CUP PACEART REMOTE DEVICE CHECK
Date Time Interrogation Session: 20240727230628
Implantable Pulse Generator Implant Date: 20200918

## 2022-11-13 ENCOUNTER — Encounter: Payer: Self-pay | Admitting: Cardiovascular Disease

## 2022-11-20 NOTE — Progress Notes (Signed)
Carelink Summary Report / Loop Recorder 

## 2022-11-26 DIAGNOSIS — H31092 Other chorioretinal scars, left eye: Secondary | ICD-10-CM | POA: Diagnosis not present

## 2022-11-26 DIAGNOSIS — H43393 Other vitreous opacities, bilateral: Secondary | ICD-10-CM | POA: Diagnosis not present

## 2022-11-26 DIAGNOSIS — H2513 Age-related nuclear cataract, bilateral: Secondary | ICD-10-CM | POA: Diagnosis not present

## 2022-11-26 DIAGNOSIS — G43B0 Ophthalmoplegic migraine, not intractable: Secondary | ICD-10-CM | POA: Diagnosis not present

## 2022-11-28 ENCOUNTER — Ambulatory Visit: Payer: PPO | Admitting: Cardiovascular Disease

## 2022-12-07 LAB — CUP PACEART REMOTE DEVICE CHECK
Date Time Interrogation Session: 20240829231107
Implantable Pulse Generator Implant Date: 20200918
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
## 2022-12-09 ENCOUNTER — Other Ambulatory Visit: Payer: Self-pay | Admitting: Adult Health

## 2022-12-09 ENCOUNTER — Ambulatory Visit: Payer: PPO | Attending: Cardiovascular Disease | Admitting: Adult Health

## 2022-12-09 ENCOUNTER — Ambulatory Visit (INDEPENDENT_AMBULATORY_CARE_PROVIDER_SITE_OTHER): Payer: PPO

## 2022-12-09 ENCOUNTER — Encounter: Payer: Self-pay | Admitting: Adult Health

## 2022-12-09 VITALS — BP 144/94 | HR 73 | Ht 71.0 in | Wt 175.0 lb

## 2022-12-09 DIAGNOSIS — I480 Paroxysmal atrial fibrillation: Secondary | ICD-10-CM | POA: Diagnosis not present

## 2022-12-09 DIAGNOSIS — E7800 Pure hypercholesterolemia, unspecified: Secondary | ICD-10-CM | POA: Diagnosis not present

## 2022-12-09 DIAGNOSIS — I25119 Atherosclerotic heart disease of native coronary artery with unspecified angina pectoris: Secondary | ICD-10-CM | POA: Diagnosis not present

## 2022-12-09 DIAGNOSIS — I10 Essential (primary) hypertension: Secondary | ICD-10-CM | POA: Diagnosis not present

## 2022-12-09 MED ORDER — METOPROLOL SUCCINATE 50 MG PO CS24: 50.0000 mg | EXTENDED_RELEASE_CAPSULE | Freq: Every day | ORAL | 3 refills | Status: DC

## 2022-12-09 NOTE — Patient Instructions (Signed)
Medication Instructions:  STOP METOPROLOL TARTRATE START METOPROLOL SUCCINATE 50 MG DAILY AT BEDTIME. *If you need a refill on your cardiac medications before your next appointment, please call your pharmacy*   Lab Work: No Labs If you have labs (blood work) drawn today and your tests are completely normal, you will receive your results only by: MyChart Message (if you have MyChart) OR A paper copy in the mail If you have any lab test that is abnormal or we need to change your treatment, we will call you to review the results.   Testing/Procedures: No Testing   Follow-Up: At [facility], you and your health needs are our priority.  As part of our continuing mission to provide you with exceptional heart care, we have created designated Provider Care Teams.  These Care Teams include your primary Cardiologist (physician) and Advanced Practice Providers (APPs -  Physician Assistants and Nurse Practitioners) who all work together to provide you with the care you need, when you need it.   Your next appointment:   6 month(s)  Provider:   Joni Reining DNP, ANP or [name], MD

## 2022-12-11 ENCOUNTER — Telehealth: Payer: Self-pay | Admitting: Adult Health

## 2022-12-11 MED ORDER — METOPROLOL SUCCINATE ER 50 MG PO TB24: 50.0000 mg | ORAL_TABLET | Freq: Every day | ORAL | 3 refills | Status: DC

## 2022-12-11 NOTE — Telephone Encounter (Signed)
Called pt to let him know the new order will be sent to his pharmacy. Pt verbalized understanding.

## 2022-12-11 NOTE — Telephone Encounter (Signed)
Will forward this to provider for approval on change.

## 2022-12-11 NOTE — Telephone Encounter (Signed)
Pt c/o medication issue:  1. Name of Medication: Metoprolol Succinate 50 MG CS24   2. How are you currently taking this medication (dosage and times per day)? As written   3. Are you having a reaction (difficulty breathing--STAT)? No   4. What is your medication issue? [facility] pharmacy called in stating the capsules are not covered by insurance, but the tablets are. They would need a new prescription for the tablets called in. Please advise.

## 2022-12-11 NOTE — Telephone Encounter (Signed)
Pt c/o medication issue:  1. Name of Medication: Metoprolol Succinate 50 MG CS24   2. How are you currently taking this medication (dosage and times per day)? As written  3. Are you having a reaction (difficulty breathing--STAT)? No  4. What is your medication issue? Patient is calling because he would like this medication as a tablet form instead of capsule. Please advise.

## 2022-12-12 ENCOUNTER — Other Ambulatory Visit: Payer: Self-pay | Admitting: Cardiovascular Disease

## 2022-12-19 ENCOUNTER — Telehealth: Payer: Self-pay | Admitting: Cardiovascular Disease

## 2022-12-19 NOTE — Telephone Encounter (Signed)
  1. Has your device fired?   2. Is you device beeping?   3. Are you experiencing draining or swelling at device site?   4. Are you calling to see if we received your device transmission? Had a Loop Recorder put in and having problem  5. Have you passed out? no   Please route to Device Clinic Pool

## 2022-12-19 NOTE — Telephone Encounter (Signed)
Patient states saw Luis [name] last OV.  He states changed type of metoprolol  Last night he states rapid and erratic HR  lasted a couple of hours.  He ask if the med change may have made this happen. He does have a loop recorder and call device company to see if they saw anything during the event.  States HR was approx 120. States got his attention because he was very lightheaded. Once back to normal rhythm he felt OK, just tired  Metoprolol Succinate 50 mg Daily, started 4 days ago.  Has had no fatigue. He was changed from Metoprolol tartrate due to fatigue. States no events on the Metoprolol tartrate.  He states he would be OK going back to the metoprolol tartrate if this (event) is going to happen.  No BP readings since dose change

## 2022-12-19 NOTE — Telephone Encounter (Signed)
Patient called, reports he was sitting down and heart rate went into the 120's. ILR checked and no alerts noted. Patient has note in chart about metoprolol that is being sent to NP. Please see other phone note from today.

## 2022-12-19 NOTE — Telephone Encounter (Signed)
Pt c/o medication issue:  1. Name of Medication:  metoprolol succinate (TOPROL-XL) 50 MG 24 hr tablet   2. How are you currently taking this medication (dosage and times per day)?    3. Are you having a reaction (difficulty breathing--STAT)? no  4. What is your medication issue? There was a change in dosage but patient had event on last night. Please advise

## 2022-12-22 NOTE — Progress Notes (Signed)
Carelink Summary Report / Loop Recorder 

## 2022-12-22 NOTE — Telephone Encounter (Signed)
Spoke with patient and he states he has been feeling OK the last few days.  He will stay with the metoprolol succinate for a little while longer.  If he decides to go back to the tartrate, he will call and let us know

## 2023-01-07 LAB — CUP PACEART REMOTE DEVICE CHECK
Date Time Interrogation Session: 20241001230940
Implantable Pulse Generator Implant Date: 20200918

## 2023-01-12 ENCOUNTER — Ambulatory Visit: Payer: PPO

## 2023-01-12 DIAGNOSIS — I480 Paroxysmal atrial fibrillation: Secondary | ICD-10-CM | POA: Diagnosis not present

## 2023-01-29 NOTE — Progress Notes (Signed)
Carelink Summary Report / Loop Recorder 

## 2023-02-11 LAB — CUP PACEART REMOTE DEVICE CHECK
Date Time Interrogation Session: 20241103230231
Implantable Pulse Generator Implant Date: 20200918

## 2023-02-16 ENCOUNTER — Ambulatory Visit (INDEPENDENT_AMBULATORY_CARE_PROVIDER_SITE_OTHER): Payer: PPO

## 2023-02-16 DIAGNOSIS — I480 Paroxysmal atrial fibrillation: Secondary | ICD-10-CM

## 2023-02-17 ENCOUNTER — Ambulatory Visit: Payer: PPO | Attending: Cardiovascular Disease | Admitting: Cardiovascular Disease

## 2023-02-17 ENCOUNTER — Encounter: Payer: Self-pay | Admitting: Cardiovascular Disease

## 2023-02-17 VITALS — BP 138/80 | HR 63 | Ht 71.0 in | Wt 177.0 lb

## 2023-02-17 DIAGNOSIS — I480 Paroxysmal atrial fibrillation: Secondary | ICD-10-CM

## 2023-02-17 NOTE — Patient Instructions (Signed)

## 2023-02-17 NOTE — Progress Notes (Signed)
  Electrophysiology Office Note:    Date:  02/17/2023   ID:  Jasmin, Route 03/26/1946, MRN [id number]  PCP:  Charlane Ferretti, DO   [facility] Providers Cardiologist:  [name], MD Electrophysiologist:  Maurice Small, MD     Referring MD: Charlane Ferretti, DO   History of Present Illness:    Luis [name] is a 77 y.o. male with a medical history significant for atrial fibrillation status post ablation in 2014 who presents for EP follow-up.     Since his ablation in 2014, he has had rare recurrences of atrial fibrillation.  He has been monitored with a loop recorder since 2016.  The most recent device was placed in September 2020.     Today, he reports that he is doing very well. He has no complaints  EKGs/Labs/Other Studies Reviewed Today:     EKG:   EKG Interpretation Date/Time:  Tuesday February 17 2023 09:37:59 EST Ventricular Rate:  63 PR Interval:  174 QRS Duration:  94 QT Interval:  394 QTC Calculation: 403 R Axis:   67  Text Interpretation: Normal sinus rhythm Incomplete right bundle branch block When compared with ECG of 09-Dec-2022 09:06, Premature atrial complexes are no longer Present Confirmed by York Pellant [phone]) on 02/17/2023 10:07:10 AM     Physical Exam:    VS:  BP 138/80 (BP Location: Left Arm, Patient Position: Sitting, Cuff Size: Normal)   Pulse 63   Ht 5\' 11"  (1.803 m)   Wt 177 lb (80.3 kg)   SpO2 98%   BMI 24.69 kg/m     Wt Readings from Last 3 Encounters:  02/17/23 177 lb (80.3 kg)  12/09/22 175 lb (79.4 kg)  08/25/22 171 lb 15.3 oz (78 kg)     GEN: Well nourished, well developed in no acute distress CARDIAC: RRR The device site is normal -- no tenderness, edema, drainage, redness, threatened erosion.  RESPIRATORY:  Normal work of breathing MUSCULOSKELETAL: no edema    ASSESSMENT & PLAN:     Atrial fibrillation Status post ablation in 2014 [employer] loop recorder in place No episodes detected  in the past 6 months  Secondary hypercoagulable state CHA2DS2-VASc score is at least 3 He has declined anticoagulation in the past, which is reasonable given that he is having such a low burden of A-fib  HTN Stable on current regimen    Signed, Maurice Small, MD  02/17/2023 10:07 AM     [facility]

## 2023-02-18 DIAGNOSIS — E785 Hyperlipidemia, unspecified: Secondary | ICD-10-CM | POA: Diagnosis not present

## 2023-02-18 DIAGNOSIS — I10 Essential (primary) hypertension: Secondary | ICD-10-CM | POA: Diagnosis not present

## 2023-02-18 DIAGNOSIS — R7989 Other specified abnormal findings of blood chemistry: Secondary | ICD-10-CM | POA: Diagnosis not present

## 2023-02-18 DIAGNOSIS — E162 Hypoglycemia, unspecified: Secondary | ICD-10-CM | POA: Diagnosis not present

## 2023-02-25 DIAGNOSIS — Z1339 Encounter for screening examination for other mental health and behavioral disorders: Secondary | ICD-10-CM | POA: Diagnosis not present

## 2023-02-25 DIAGNOSIS — I4891 Unspecified atrial fibrillation: Secondary | ICD-10-CM | POA: Diagnosis not present

## 2023-02-25 DIAGNOSIS — R7301 Impaired fasting glucose: Secondary | ICD-10-CM | POA: Diagnosis not present

## 2023-02-25 DIAGNOSIS — E785 Hyperlipidemia, unspecified: Secondary | ICD-10-CM | POA: Diagnosis not present

## 2023-02-25 DIAGNOSIS — K219 Gastro-esophageal reflux disease without esophagitis: Secondary | ICD-10-CM | POA: Diagnosis not present

## 2023-02-25 DIAGNOSIS — Z23 Encounter for immunization: Secondary | ICD-10-CM | POA: Diagnosis not present

## 2023-02-25 DIAGNOSIS — I10 Essential (primary) hypertension: Secondary | ICD-10-CM | POA: Diagnosis not present

## 2023-02-25 DIAGNOSIS — I25118 Atherosclerotic heart disease of native coronary artery with other forms of angina pectoris: Secondary | ICD-10-CM | POA: Diagnosis not present

## 2023-02-25 DIAGNOSIS — Z0000 Encounter for general adult medical examination without abnormal findings: Secondary | ICD-10-CM | POA: Diagnosis not present

## 2023-02-25 DIAGNOSIS — D6869 Other thrombophilia: Secondary | ICD-10-CM | POA: Diagnosis not present

## 2023-02-25 DIAGNOSIS — G8929 Other chronic pain: Secondary | ICD-10-CM | POA: Diagnosis not present

## 2023-02-25 DIAGNOSIS — M25569 Pain in unspecified knee: Secondary | ICD-10-CM | POA: Diagnosis not present

## 2023-02-25 DIAGNOSIS — D692 Other nonthrombocytopenic purpura: Secondary | ICD-10-CM | POA: Diagnosis not present

## 2023-02-25 DIAGNOSIS — Z96659 Presence of unspecified artificial knee joint: Secondary | ICD-10-CM | POA: Diagnosis not present

## 2023-02-25 DIAGNOSIS — Z1331 Encounter for screening for depression: Secondary | ICD-10-CM | POA: Diagnosis not present

## 2023-02-25 DIAGNOSIS — R82998 Other abnormal findings in urine: Secondary | ICD-10-CM | POA: Diagnosis not present

## 2023-02-25 DIAGNOSIS — R972 Elevated prostate specific antigen [PSA]: Secondary | ICD-10-CM | POA: Diagnosis not present

## 2023-03-13 NOTE — Progress Notes (Signed)
Carelink Summary Report / Loop Recorder 

## 2023-03-23 ENCOUNTER — Ambulatory Visit: Payer: PPO

## 2023-03-23 DIAGNOSIS — I480 Paroxysmal atrial fibrillation: Secondary | ICD-10-CM

## 2023-03-23 LAB — CUP PACEART REMOTE DEVICE CHECK
Date Time Interrogation Session: 20241215230144
Implantable Pulse Generator Implant Date: 20200918

## 2023-04-02 DIAGNOSIS — Z85828 Personal history of other malignant neoplasm of skin: Secondary | ICD-10-CM | POA: Diagnosis not present

## 2023-04-02 DIAGNOSIS — D485 Neoplasm of uncertain behavior of skin: Secondary | ICD-10-CM | POA: Diagnosis not present

## 2023-04-02 DIAGNOSIS — L820 Inflamed seborrheic keratosis: Secondary | ICD-10-CM | POA: Diagnosis not present

## 2023-04-02 DIAGNOSIS — C44612 Basal cell carcinoma of skin of right upper limb, including shoulder: Secondary | ICD-10-CM | POA: Diagnosis not present

## 2023-04-02 DIAGNOSIS — C44622 Squamous cell carcinoma of skin of right upper limb, including shoulder: Secondary | ICD-10-CM | POA: Diagnosis not present

## 2023-04-02 DIAGNOSIS — L570 Actinic keratosis: Secondary | ICD-10-CM | POA: Diagnosis not present

## 2023-04-02 DIAGNOSIS — L821 Other seborrheic keratosis: Secondary | ICD-10-CM | POA: Diagnosis not present

## 2023-04-15 ENCOUNTER — Ambulatory Visit: Payer: PPO | Admitting: Urology

## 2023-04-15 ENCOUNTER — Encounter: Payer: Self-pay | Admitting: Urology

## 2023-04-15 VITALS — BP 173/74 | HR 78

## 2023-04-15 DIAGNOSIS — R972 Elevated prostate specific antigen [PSA]: Secondary | ICD-10-CM | POA: Diagnosis not present

## 2023-04-15 DIAGNOSIS — N200 Calculus of kidney: Secondary | ICD-10-CM | POA: Diagnosis not present

## 2023-04-15 LAB — URINALYSIS, ROUTINE W REFLEX MICROSCOPIC
Bilirubin, UA: NEGATIVE
Glucose, UA: NEGATIVE
Ketones, UA: NEGATIVE
Nitrite, UA: NEGATIVE
Protein,UA: NEGATIVE
RBC, UA: NEGATIVE
Specific Gravity, UA: 1.01 (ref 1.005–1.030)
Urobilinogen, Ur: 0.2 mg/dL (ref 0.2–1.0)
pH, UA: 6 (ref 5.0–7.5)

## 2023-04-15 LAB — MICROSCOPIC EXAMINATION: Bacteria, UA: NONE SEEN

## 2023-04-15 NOTE — Patient Instructions (Signed)

## 2023-04-15 NOTE — Progress Notes (Signed)
 04/15/2023 8:52 AM   Luis [name] 10/18/1945 [id number]  Referring provider: Valentin Skates, DO [address] [place],  [place] [address]  Elevated PSA   HPI: Mr Luis [name] is a 77yo here for evaluation of elevated PSA. His PSA was 7.1 in 02/2023. PSA was 6 last year. IPSS 7 QOL 2 on no BPH therapy.  Nocturia 1-2x. He has urinary frequency which is not bothersome. He has a hx of nephrolithiasis and was previously seen at [facility].  He was previously told he had blood in his Urine but UA today shows no RBCs. He has a hx of gross hematuria after starting xarelto . He had 2 hematuria workups in the past which only showed calculi.    PMH: Past Medical History:  Diagnosis Date   Allergy    SEASONAL   Arthritis    BACK AND KNEES   Complication of anesthesia    pt states aspirated following ablation    Coronary artery disease    Dysrhythmia    GERD (gastroesophageal reflux disease)    History of kidney stones 07/2006   x 1   Hyperlipidemia    Hypertension    Normal coronary arteries 2008   Normal LV function   Paroxysmal atrial fibrillation (HCC)    2008 and [address]9985, PVI 09/07/12    Surgical History: Past Surgical History:  Procedure Laterality Date   ATRIAL ABLATION SURGERY  09/07/2012   PVI by Dr Kelsie   ATRIAL FIBRILLATION ABLATION N/A 09/07/2012   Procedure: ATRIAL FIBRILLATION ABLATION;  Surgeon: Lynwood Kelsie, MD;  Location: [facility] CATH LAB;  Service: Cardiovascular;  Laterality: N/A;   CARDIAC CATHETERIZATION Left 07/20/2006   No significant CAD, EF 60%, continue medical therapy   COLONOSCOPY  05/13/2021   CYSTOSCOPY W/ RETROGRADES Left 02/28/2019   Procedure: CYSTOSCOPY WITH RETROGRADE PYELOGRAM;  Surgeon: Elisabeth Valli BIRCH, MD;  Location: [facility];  Service: Urology;  Laterality: Left;   CYSTOSCOPY/URETEROSCOPY/HOLMIUM LASER/STENT PLACEMENT Left 03/08/2019   Procedure: CYSTOSCOPY/URETEROSCOPY/HOLMIUM LASER/STENT PLACEMENT;  Surgeon: Elisabeth Valli BIRCH, MD;  Location:  [facility];  Service: Urology;  Laterality: Left;  1 HR   EP IMPLANTABLE DEVICE N/A 01/23/2015   Procedure: Loop Recorder Insertion;  Surgeon: Lynwood Kelsie, MD;  Location: [facility] INVASIVE CV LAB;  Service: Cardiovascular;  Laterality: N/A;   implantable loop recorder placement  12/24/2018   previous ILR removed and a new MDT Reveal LINQ2 implanted in its place in office by Dr Kelsie   KNEE ARTHROSCOPY Right    TEE WITHOUT CARDIOVERSION N/A 09/06/2012   Procedure: TRANSESOPHAGEAL ECHOCARDIOGRAM (TEE);  Surgeon: Vinie KYM Maxcy, MD;  Location: [facility] ENDOSCOPY;  Service: Cardiovascular;  Laterality: N/A;   TOTAL KNEE ARTHROPLASTY Right 12/04/2020   Procedure: TOTAL KNEE ARTHROPLASTY;  Surgeon: Ernie Cough, MD;  Location: [facility]S;  Service: Orthopedics;  Laterality: Right;   UPPER GASTROINTESTINAL ENDOSCOPY      Home Medications:  Allergies as of 04/15/2023       Reactions   Ciprofloxacin  Rash        Medication List        Accurate as of April 15, 2023  8:52 AM. If you have any questions, ask your nurse or doctor.          STOP taking these medications    acetaminophen  500 MG tablet Commonly known as: TYLENOL    celecoxib  200 MG capsule Commonly known as: CELEBREX        TAKE these medications    aspirin  EC 81 MG tablet Take  1 tablet (81 mg total) by mouth in the morning. Resume once daily aspirin  after completing 28 days of twice daily dosing. Swallow whole.   metoprolol  succinate 50 MG 24 hr tablet Commonly known as: TOPROL -XL Take 1 tablet (50 mg total) by mouth daily. Take with or immediately following a meal.   omeprazole  20 MG capsule Commonly known as: PRILOSEC Take 20 mg by mouth in the morning.   rosuvastatin  10 MG tablet Commonly known as: CRESTOR  Take 1 tablet by mouth once daily        Allergies:  Allergies  Allergen Reactions   Ciprofloxacin  Rash    Family History: Family History  Problem Relation Age of Onset   Liver cancer  Mother    CAD Father    Heart Problems Father    Stroke Father    Hypertension Father    Heart Problems Brother 30       Had stent put in   Stomach cancer Neg Hx    Prostate cancer Neg Hx    Colon cancer Neg Hx     Social History:  reports that he quit smoking about 28 years ago. His smoking use included cigarettes. He started smoking about 63 years ago. He has never used smokeless tobacco. He reports current alcohol  use of about 7.0 standard drinks of alcohol  per week. He reports that he does not use drugs.  ROS: All other review of systems were reviewed and are negative except what is noted above in HPI  Physical Exam: BP (!) 173/74   Pulse 78   Constitutional:  Alert and oriented, No acute distress. HEENT: [place] AT, moist mucus membranes.  Trachea midline, no masses. Cardiovascular: No clubbing, cyanosis, or edema. Respiratory: Normal respiratory effort, no increased work of breathing. GI: Abdomen is soft, nontender, nondistended, no abdominal masses GU: No CVA tenderness. Circumcised phallus. No masses/lesions on penis, testis, scrotum. Prostate 40g smooth no nodules no induration.  Lymph: No cervical or inguinal lymphadenopathy. Skin: No rashes, bruises or suspicious lesions. Neurologic: Grossly intact, no focal deficits, moving all 4 extremities. Psychiatric: Normal mood and affect.  Laboratory Data: Lab Results  Component Value Date   WBC 9.3 12/05/2020   HGB 13.4 12/05/2020   HCT 37.6 (L) 12/05/2020   MCV 95.9 12/05/2020   PLT 129 (L) 12/05/2020    Lab Results  Component Value Date   CREATININE 1.06 12/05/2020    No results found for: PSA  No results found for: TESTOSTERONE  No results found for: HGBA1C  Urinalysis    Component Value Date/Time   COLORURINE YELLOW 09/14/2012 1537   APPEARANCEUR CLEAR 09/14/2012 1537   LABSPEC 1.027 09/14/2012 1537   PHURINE 5.0 09/14/2012 1537   GLUCOSEU NEG 09/14/2012 1537   HGBUR NEG 09/14/2012 1537    BILIRUBINUR SMALL (A) 09/14/2012 1537   KETONESUR NEG 09/14/2012 1537   PROTEINUR NEG 09/14/2012 1537   UROBILINOGEN 1 09/14/2012 1537   NITRITE NEG 09/14/2012 1537   LEUKOCYTESUR MOD (A) 09/14/2012 1537    No results found for: LABMICR, WBCUA, RBCUA, LABEPIT, MUCUS, BACTERIA  Pertinent Imaging:  Results for orders placed during the hospital encounter of 07/23/06  DG Abd 1 View  Narrative Clinical Data: Pre-lithotripsy. Left-sided ureteral stone. KUB: Comparison: 07/17/06. Findings: Patient has a new double J stent in place on the left in good position. Previously seen left ureteral stone has been pushed back and now lies over the proximal loop of the stent at the UPJ. No right ureteral calculi.  Impression  Left UPJ stone is now at the level of the left L-2 transverse process projecting over the proximal loop of a double J stent.  Provider: Katheryn Pines  No results found for this or any previous visit.  No results found for this or any previous visit.  No results found for this or any previous visit.  No results found for this or any previous visit.  No results found for this or any previous visit.  No results found for this or any previous visit.  No results found for this or any previous visit.   Assessment & Plan:    1. Elevated PSA (Primary) Followup 3 months with PSA - Urinalysis, Routine w reflex microscopic  2. Nephrolithiasis -KUB in 3 months  No follow-ups on file.  Belvie Clara, MD  [facility] [place]

## 2023-04-27 ENCOUNTER — Ambulatory Visit (INDEPENDENT_AMBULATORY_CARE_PROVIDER_SITE_OTHER): Payer: PPO

## 2023-04-27 DIAGNOSIS — I480 Paroxysmal atrial fibrillation: Secondary | ICD-10-CM

## 2023-04-27 LAB — CUP PACEART REMOTE DEVICE CHECK
Date Time Interrogation Session: 20250119230328
Implantable Pulse Generator Implant Date: 20200918

## 2023-04-30 NOTE — Progress Notes (Signed)
Carelink Summary Report / Loop Recorder 

## 2023-05-02 ENCOUNTER — Encounter: Payer: Self-pay | Admitting: Cardiovascular Disease

## 2023-06-01 ENCOUNTER — Ambulatory Visit (INDEPENDENT_AMBULATORY_CARE_PROVIDER_SITE_OTHER): Payer: PPO

## 2023-06-01 DIAGNOSIS — I480 Paroxysmal atrial fibrillation: Secondary | ICD-10-CM | POA: Diagnosis not present

## 2023-06-03 LAB — CUP PACEART REMOTE DEVICE CHECK
Date Time Interrogation Session: 20250223230056
Implantable Pulse Generator Implant Date: 20200918

## 2023-06-05 NOTE — Progress Notes (Signed)
 Carelink Summary Report / Loop Recorder

## 2023-06-09 ENCOUNTER — Encounter: Payer: Self-pay | Admitting: Cardiovascular Disease

## 2023-06-09 ENCOUNTER — Ambulatory Visit: Payer: PPO | Attending: Cardiovascular Disease | Admitting: Cardiovascular Disease

## 2023-06-09 VITALS — BP 138/82 | Ht 71.0 in | Wt 175.0 lb

## 2023-06-09 DIAGNOSIS — I480 Paroxysmal atrial fibrillation: Secondary | ICD-10-CM | POA: Diagnosis not present

## 2023-06-09 DIAGNOSIS — E785 Hyperlipidemia, unspecified: Secondary | ICD-10-CM | POA: Diagnosis not present

## 2023-06-09 DIAGNOSIS — I10 Essential (primary) hypertension: Secondary | ICD-10-CM | POA: Diagnosis not present

## 2023-06-09 DIAGNOSIS — I2510 Atherosclerotic heart disease of native coronary artery without angina pectoris: Secondary | ICD-10-CM

## 2023-06-09 NOTE — Assessment & Plan Note (Signed)
 History of PAF status post A-fib ablation by Dr. Johney Frame in 2014.  He has had no significant recurrence.  He is not on oral anticoagulation.  He is followed by Dr. [name] on an outpatient basis.

## 2023-06-09 NOTE — Progress Notes (Unsigned)
 06/09/2023 Luis [name]   07/10/1945  [id number]  Primary Physician Charlane Ferretti, DO Primary Cardiologist: Runell Gess MD FACP, [place], [place], [place]  HPI:  Luis [name] is a 78 y.o.   thin and fit-appearing, married Caucasian male, father of 4, grandfather to 2 grandchildren who I last office 11/08/21 he has a history of paroxysmal atrial fibrillation maintaining sinus rhythm when I saw him in December. He has been evaluated by Dr. [name] at [facility] remotely back in May 2008 and was cath'd by Dr. [name] the month before revealing noncritical CAD with normal LV function. He does have a 35-pack-year history of tobacco abuse having quit August 1996. His other problems include history of hypertension, mild hyperlipidemia and family history of heart disease with a father who had an MI at age 43 and a brother who had stent placed at age 47. He did get occasional palpitations when I saw him back in December. He has had 2 episodes of symptomatic A-fib, the last of which occurred on June 19, 2012, which resulted in presyncope. He noticed this while he was playing golf. When he presented to the ER his heart rate was 144. He was placed on IV diltiazem and ultimately converted to sinus rhythm and was discharged home on flecainide 50 mg p.o. b.i.d. Because his CHADS2 score was 1 it was elected to keep him on aspirin.he underwent atrial fibrillation ablation by Dr. [name] on 09/07/12 with an excellent clinical result. He's had no recurrent episodes appear PAF. His Flecainide was discontinued. He is on Xarelto oral anticoagulation. He denies chest pain or shortness of breath. He had an abdominal duplex that showed no evidence of aneurysm.  He had an implantable loop recorder placed by Dr. Johney Frame in October 2016.[name] He's had one episode of breakthrough PAF since his ablation 3 years ago. Since I saw him a year ago his A. fib burden is 0. He's had no breakthrough episodes remains on Xarelto oral  anticoagulation.     Since I saw him in the office 1-1/2 years ago he continues to do well.  He did Dr. [name] 02/17/2023.  He has had minimal A-fib recurrence since his ablation 9 years ago.  He is currently not on oral anticoagulation.  He denies chest pain or shortness of breath.  He is fairly active, walks, does chores around the house and shoots trap.  Current Meds  Medication Sig   aspirin EC 81 MG tablet Take 1 tablet (81 mg total) by mouth in the morning. Resume once daily aspirin after completing 28 days of twice daily dosing. Swallow whole.   omeprazole (PRILOSEC) 20 MG capsule Take 20 mg by mouth in the morning.   rosuvastatin (CRESTOR) 10 MG tablet Take 1 tablet by mouth once daily     Allergies  Allergen Reactions   Ciprofloxacin Rash    Social History   Socioeconomic History   Marital status: Married    Spouse name: Not on file   Number of children: 4   Years of education: Not on file   Highest education level: Not on file  Occupational History   Occupation: retired  Tobacco Use   Smoking status: Former    Current packs/day: 0.00    Types: Cigarettes    Start date: 11/27/1959    Quit date: 11/27/1994    Years since quitting: 28.5   Smokeless tobacco: Never  Vaping Use   Vaping status: Never Used  Substance and Sexual Activity  Alcohol use: Yes    Alcohol/week: 7.0 standard drinks of alcohol    Types: 7 Standard drinks or equivalent per week    Comment: 3-4 oz daily 1/2 drinks per night   Drug use: No   Sexual activity: Not on file  Other Topics Concern   Not on file  Social History Narrative   Lives in [place] with spouse.   Retired Visual merchandiser   Social Drivers of Corporate investment banker Strain: Not on [employer] Insecurity: Not on file  Transportation Needs: Not on file  Physical Activity: Not on file  Stress: Not on file  Social Connections: Not on file  Intimate Partner Violence: Not on file     Review of Systems: General:  negative for chills, fever, night sweats or weight changes.  Cardiovascular: negative for chest pain, dyspnea on exertion, edema, orthopnea, palpitations, paroxysmal nocturnal dyspnea or shortness of breath Dermatological: negative for rash Respiratory: negative for cough or wheezing Urologic: negative for hematuria Abdominal: negative for nausea, vomiting, diarrhea, bright red blood per rectum, melena, or hematemesis Neurologic: negative for visual changes, syncope, or dizziness All other systems reviewed and are otherwise negative except as noted above.    Blood pressure 138/82, height 5\' 11"  (1.803 m), weight 175 lb (79.4 kg), SpO2 98%.  General appearance: alert and no distress Neck: no adenopathy, no carotid bruit, no JVD, supple, symmetrical, trachea midline, and thyroid not enlarged, symmetric, no tenderness/mass/nodules Lungs: clear to auscultation bilaterally Heart: regular rate and rhythm, S1, S2 normal, no murmur, click, rub or gallop Extremities: extremities normal, atraumatic, no cyanosis or edema Pulses: 2+ and symmetric Skin: Skin color, texture, turgor normal. No rashes or lesions Neurologic: Grossly normal  EKG EKG Interpretation Date/Time:  Tuesday June 09 2023 07:59:00 EST Ventricular Rate:  61 PR Interval:  142 QRS Duration:  86 QT Interval:  410 QTC Calculation: 412 R Axis:   26  Text Interpretation: Normal sinus rhythm Normal ECG When compared with ECG of 17-Feb-2023 09:37, No significant change was found Confirmed by [name] [phone]) on 06/09/2023 8:12:48 AM    ASSESSMENT AND PLAN:   Essential hypertension History of essential hypertension her blood pressure measured today at 138/82.  He is on Toprol XL 50 mg a day.  Paroxysmal atrial fibrillation- RFA June 2014 History of PAF status post A-fib ablation by Dr. Johney Frame in 2014.  He has had no significant recurrence.  He is not on oral anticoagulation.  He is followed by Dr. [name] on an outpatient  basis.  Dyslipidemia History of dyslipidemia on rosuvastatin with lipid profile performed 02/18/2023 revealing total cholesterol 121, LDL 47 and HDL 45.  CAD (coronary artery disease) History of noncritical CAD by cath performed in 2008.  He is asymptomatic.     Runell Gess MD FACP,FACC,FAHA, [facility] 06/09/2023 8:24 AM

## 2023-06-09 NOTE — Patient Instructions (Signed)
Medication Instructions:  Your physician recommends that you continue on your current medications as directed. Please refer to the Current Medication list given to you today.  *If you need a refill on your cardiac medications before your next appointment, please call your pharmacy*   Lab Work: .NONE ordered at this time of appointment    Testing/Procedures: NONE ordered at this time of appointment     Follow-Up: At [facility], you and your health needs are our priority.  As part of our continuing mission to provide you with exceptional heart care, we have created designated Provider Care Teams.  These Care Teams include your primary Cardiologist (physician) and Advanced Practice Providers (APPs -  Physician Assistants and Nurse Practitioners) who all work together to provide you with the care you need, when you need it.  We recommend signing up for the patient portal called "MyChart".  Sign up information is provided on this After Visit Summary.  MyChart is used to connect with patients for Virtual Visits (Telemedicine).  Patients are able to view lab/test results, encounter notes, upcoming appointments, etc.  Non-urgent messages can be sent to your provider as well.   To learn more about what you can do with MyChart, go to [email or web address].    Your next appointment:   1 year(s)  Provider:   [name], MD

## 2023-06-09 NOTE — Assessment & Plan Note (Signed)
 History of noncritical CAD by cath performed in 2008.  He is asymptomatic.

## 2023-06-09 NOTE — Assessment & Plan Note (Signed)
 History of dyslipidemia on rosuvastatin with lipid profile performed 02/18/2023 revealing total cholesterol 121, LDL 47 and HDL 45.

## 2023-06-09 NOTE — Assessment & Plan Note (Signed)
 History of essential hypertension her blood pressure measured today at 138/82.  He is on Toprol XL 50 mg a day.

## 2023-06-18 DIAGNOSIS — M1712 Unilateral primary osteoarthritis, left knee: Secondary | ICD-10-CM | POA: Diagnosis not present

## 2023-07-02 DIAGNOSIS — R051 Acute cough: Secondary | ICD-10-CM | POA: Diagnosis not present

## 2023-07-02 DIAGNOSIS — R0981 Nasal congestion: Secondary | ICD-10-CM | POA: Diagnosis not present

## 2023-07-02 DIAGNOSIS — G43909 Migraine, unspecified, not intractable, without status migrainosus: Secondary | ICD-10-CM | POA: Diagnosis not present

## 2023-07-02 DIAGNOSIS — J069 Acute upper respiratory infection, unspecified: Secondary | ICD-10-CM | POA: Diagnosis not present

## 2023-07-02 DIAGNOSIS — J029 Acute pharyngitis, unspecified: Secondary | ICD-10-CM | POA: Diagnosis not present

## 2023-07-02 DIAGNOSIS — Z1152 Encounter for screening for COVID-19: Secondary | ICD-10-CM | POA: Diagnosis not present

## 2023-07-02 DIAGNOSIS — R5383 Other fatigue: Secondary | ICD-10-CM | POA: Diagnosis not present

## 2023-07-06 ENCOUNTER — Ambulatory Visit: Payer: PPO

## 2023-07-06 DIAGNOSIS — I480 Paroxysmal atrial fibrillation: Secondary | ICD-10-CM

## 2023-07-06 LAB — CUP PACEART REMOTE DEVICE CHECK
Date Time Interrogation Session: 20250330230157
Implantable Pulse Generator Implant Date: 20200918

## 2023-07-08 NOTE — Progress Notes (Signed)
 Carelink Summary Report / Loop Recorder

## 2023-07-08 NOTE — Addendum Note (Signed)
 Addended by: Geralyn Flash D on: 07/08/2023 02:22 PM   Modules accepted: Orders

## 2023-07-10 ENCOUNTER — Other Ambulatory Visit: Payer: PPO

## 2023-07-10 ENCOUNTER — Ambulatory Visit (HOSPITAL_COMMUNITY)
Admission: RE | Admit: 2023-07-10 | Discharge: 2023-07-10 | Disposition: A | Discharge status: Home / Self Care | Source: Ambulatory Visit | Attending: Urology | Admitting: Urology

## 2023-07-10 DIAGNOSIS — N200 Calculus of kidney: Secondary | ICD-10-CM | POA: Diagnosis not present

## 2023-07-10 DIAGNOSIS — R972 Elevated prostate specific antigen [PSA]: Secondary | ICD-10-CM | POA: Diagnosis not present

## 2023-07-10 DIAGNOSIS — Z87442 Personal history of urinary calculi: Secondary | ICD-10-CM | POA: Diagnosis not present

## 2023-07-11 LAB — PSA, TOTAL AND FREE
PSA, Free Pct: 15.2 %
PSA, Free: 1.25 ng/mL
Prostate Specific Ag, Serum: 8.2 ng/mL — ABNORMAL HIGH (ref 0.0–4.0)

## 2023-07-16 ENCOUNTER — Encounter: Payer: Self-pay | Admitting: Cardiovascular Disease

## 2023-07-17 ENCOUNTER — Ambulatory Visit: Payer: PPO | Admitting: Urology

## 2023-07-17 ENCOUNTER — Encounter: Payer: Self-pay | Admitting: Urology

## 2023-07-17 VITALS — BP 171/77 | HR 73

## 2023-07-17 DIAGNOSIS — N200 Calculus of kidney: Secondary | ICD-10-CM | POA: Diagnosis not present

## 2023-07-17 DIAGNOSIS — R972 Elevated prostate specific antigen [PSA]: Secondary | ICD-10-CM

## 2023-07-17 LAB — POCT URINALYSIS DIPSTICK
Bilirubin, UA: NEGATIVE
Blood, UA: NEGATIVE
Glucose, UA: NEGATIVE
Ketones, UA: NEGATIVE
Nitrite, UA: NEGATIVE
Protein, UA: NEGATIVE
Spec Grav, UA: 1.025 (ref 1.010–1.025)
Urobilinogen, UA: 0.2 U/dL
pH, UA: 6 (ref 5.0–8.0)

## 2023-07-17 MED ORDER — TADALAFIL 20 MG PO TABS: 20.0000 mg | ORAL_TABLET | Freq: Every day | ORAL | 5 refills | Status: DC | PRN

## 2023-07-17 NOTE — Progress Notes (Signed)
 07/17/2023 9:00 AM   [name] [birth year] [id number]  Referring provider: Charlane Ferretti, DO [address] [place],  [place] [address]  No chief complaint on file.   HPI: Mr Luis [name] is a 78yo here for followup for nephrolithiasis and elevated PSA. PSA increased to 8.2. KUB from today shows no definitive calculi. IPSS 5 QOL 0 on no BPH therapy. Nocturia 1x.    PMH: Past Medical History:  Diagnosis Date   Allergy    SEASONAL   Arthritis    BACK AND KNEES   Complication of anesthesia    pt states aspirated following ablation    Coronary artery disease    Dysrhythmia    GERD (gastroesophageal reflux disease)    History of kidney stones 07/2006   x 1   Hyperlipidemia    Hypertension    Normal coronary arteries 2008   Normal LV function   Paroxysmal atrial fibrillation (HCC)    2008 and 95284, PVI 09/07/12    Surgical History: Past Surgical History:  Procedure Laterality Date   ATRIAL ABLATION SURGERY  09/07/2012   PVI by Dr Johney Frame   ATRIAL FIBRILLATION ABLATION N/A 09/07/2012   Procedure: ATRIAL FIBRILLATION ABLATION;  Surgeon: [name], MD;  Location: [facility] CATH LAB;  Service: Cardiovascular;  Laterality: N/A;   CARDIAC CATHETERIZATION Left 07/20/2006   No significant CAD, EF 60%, continue medical therapy   COLONOSCOPY  05/13/2021   CYSTOSCOPY W/ RETROGRADES Left 02/28/2019   Procedure: CYSTOSCOPY WITH RETROGRADE PYELOGRAM;  Surgeon: Noel Christmas, MD;  Location: [facility];  Service: Urology;  Laterality: Left;   CYSTOSCOPY/URETEROSCOPY/HOLMIUM LASER/STENT PLACEMENT Left 03/08/2019   Procedure: CYSTOSCOPY/URETEROSCOPY/HOLMIUM LASER/STENT PLACEMENT;  Surgeon: Noel Christmas, MD;  Location: [facility];  Service: Urology;  Laterality: Left;  1 HR   EP IMPLANTABLE DEVICE N/A 01/23/2015   Procedure: Loop Recorder Insertion;  Surgeon: [name], MD;  Location: [facility] INVASIVE CV LAB;  Service: Cardiovascular;  Laterality: N/A;   implantable loop  recorder placement  12/24/2018   previous ILR removed and a new MDT Reveal LINQ2 implanted in its place in office by Dr Johney Frame   KNEE ARTHROSCOPY Right    TEE WITHOUT CARDIOVERSION N/A 09/06/2012   Procedure: TRANSESOPHAGEAL ECHOCARDIOGRAM (TEE);  Surgeon: Chrystie Nose, MD;  Location: [facility] ENDOSCOPY;  Service: Cardiovascular;  Laterality: N/A;   TOTAL KNEE ARTHROPLASTY Right 12/04/2020   Procedure: TOTAL KNEE ARTHROPLASTY;  Surgeon: Durene Romans, MD;  Location: [facility];  Service: Orthopedics;  Laterality: Right;   UPPER GASTROINTESTINAL ENDOSCOPY      Home Medications:  Allergies as of 07/17/2023       Reactions   Ciprofloxacin Rash        Medication List        Accurate as of July 17, 2023  9:00 AM. If you have any questions, ask your nurse or doctor.          aspirin EC 81 MG tablet Take 1 tablet (81 mg total) by mouth in the morning. Resume once daily aspirin after completing 28 days of twice daily dosing. Swallow whole.   metoprolol succinate 50 MG 24 hr tablet Commonly known as: TOPROL-XL Take 1 tablet (50 mg total) by mouth daily. Take with or immediately following a meal.   omeprazole 20 MG capsule Commonly known as: PRILOSEC Take 20 mg by mouth in the morning.   rosuvastatin 10 MG tablet Commonly known as: CRESTOR Take 1 tablet by mouth once daily  Allergies:  Allergies  Allergen Reactions   Ciprofloxacin Rash    Family History: Family History  Problem Relation Age of Onset   Liver cancer Mother    CAD Father    Heart Problems Father    Stroke Father    Hypertension Father    Heart Problems Brother 69       Had stent put in   Stomach cancer Neg Hx    Prostate cancer Neg Hx    Colon cancer Neg Hx     Social History:  reports that he quit smoking about 28 years ago. His smoking use included cigarettes. He started smoking about 63 years ago. He has never used smokeless tobacco. He reports current alcohol use of about 7.0 standard  drinks of alcohol per week. He reports that he does not use drugs.  ROS: All other review of systems were reviewed and are negative except what is noted above in HPI  Physical Exam: BP (!) 171/77   Pulse 73   Constitutional:  Alert and oriented, No acute distress. HEENT: [place] AT, moist mucus membranes.  Trachea midline, no masses. Cardiovascular: No clubbing, cyanosis, or edema. Respiratory: Normal respiratory effort, no increased work of breathing. GI: Abdomen is soft, nontender, nondistended, no abdominal masses GU: No CVA tenderness.  Lymph: No cervical or inguinal lymphadenopathy. Skin: No rashes, bruises or suspicious lesions. Neurologic: Grossly intact, no focal deficits, moving all 4 extremities. Psychiatric: Normal mood and affect.  Laboratory Data: Lab Results  Component Value Date   WBC 9.3 12/05/2020   HGB 13.4 12/05/2020   HCT 37.6 (L) 12/05/2020   MCV 95.9 12/05/2020   PLT 129 (L) 12/05/2020    Lab Results  Component Value Date   CREATININE 1.06 12/05/2020    No results found for: "PSA"  No results found for: "TESTOSTERONE"  No results found for: "HGBA1C"  Urinalysis    Component Value Date/Time   COLORURINE YELLOW 09/14/2012 1537   APPEARANCEUR Clear 04/15/2023 0834   LABSPEC 1.027 09/14/2012 1537   PHURINE 5.0 09/14/2012 1537   GLUCOSEU Negative 04/15/2023 0834   HGBUR NEG 09/14/2012 1537   BILIRUBINUR negative 07/17/2023 0829   BILIRUBINUR Negative 04/15/2023 0834   KETONESUR NEG 09/14/2012 1537   PROTEINUR Negative 07/17/2023 0829   PROTEINUR Negative 04/15/2023 0834   PROTEINUR NEG 09/14/2012 1537   UROBILINOGEN 0.2 07/17/2023 0829   UROBILINOGEN 1 09/14/2012 1537   NITRITE negative 07/17/2023 0829   NITRITE Negative 04/15/2023 0834   NITRITE NEG 09/14/2012 1537   LEUKOCYTESUR Moderate (2+) (A) 07/17/2023 0829   LEUKOCYTESUR 2+ (A) 04/15/2023 0834    Lab Results  Component Value Date   LABMICR See below: 04/15/2023   WBCUA 6-10 (A)  04/15/2023   LABEPIT 0-10 04/15/2023   BACTERIA None seen 04/15/2023    Pertinent Imaging: KUb today: Images reviewed and discussed with the patient Results for orders placed during the hospital encounter of 07/23/06  DG Abd 1 View  Narrative Clinical Data: Pre-lithotripsy. Left-sided ureteral stone. KUB: Comparison: 07/17/06. Findings: Patient has a new double J stent in place on the left in good position. Previously seen left ureteral stone has been pushed back and now lies over the proximal loop of the stent at the UPJ. No right ureteral calculi.  Impression Left UPJ stone is now at the level of the left L-2 transverse process projecting over the proximal loop of a double J stent.  Provider: Jonelle Sports  No results found for this or any previous visit.  No results found for this or any previous visit.  No results found for this or any previous visit.  No results found for this or any previous visit.  No results found for this or any previous visit.  No results found for this or any previous visit.  No results found for this or any previous visit.   Assessment & Plan:    1. Nephrolithiasis (Primary) -followup 1 year with KUB - POCT urinalysis dipstick  2. Elevated PSA Iso PSA, will call with results. If IsoPSa is normal I will see him back in 6 months with PSA. If his IsoPSA is positive we will likely pursue fusion prostate MRI    No follow-ups on file.  Wilkie Aye, MD  [facility] [place]

## 2023-07-17 NOTE — Patient Instructions (Signed)

## 2023-07-17 NOTE — Addendum Note (Signed)
 Addended by: Malen Gauze on: 07/17/2023 09:21 AM   Modules accepted: Orders

## 2023-07-21 ENCOUNTER — Other Ambulatory Visit

## 2023-07-27 ENCOUNTER — Other Ambulatory Visit

## 2023-07-27 DIAGNOSIS — R972 Elevated prostate specific antigen [PSA]: Secondary | ICD-10-CM | POA: Diagnosis not present

## 2023-08-03 ENCOUNTER — Telehealth: Payer: Self-pay

## 2023-08-03 DIAGNOSIS — R972 Elevated prostate specific antigen [PSA]: Secondary | ICD-10-CM

## 2023-08-03 NOTE — Telephone Encounter (Signed)
 Patient called and made aware of ISO Psa results.  Patient will be scheduled for prostate MRI per Dr. Claretta Croft.

## 2023-08-07 ENCOUNTER — Ambulatory Visit (HOSPITAL_COMMUNITY)

## 2023-08-07 DIAGNOSIS — M545 Low back pain, unspecified: Secondary | ICD-10-CM | POA: Diagnosis not present

## 2023-08-07 DIAGNOSIS — M25551 Pain in right hip: Secondary | ICD-10-CM | POA: Diagnosis not present

## 2023-08-07 DIAGNOSIS — J069 Acute upper respiratory infection, unspecified: Secondary | ICD-10-CM | POA: Diagnosis not present

## 2023-08-07 DIAGNOSIS — I4891 Unspecified atrial fibrillation: Secondary | ICD-10-CM | POA: Diagnosis not present

## 2023-08-07 DIAGNOSIS — R051 Acute cough: Secondary | ICD-10-CM | POA: Diagnosis not present

## 2023-08-10 ENCOUNTER — Ambulatory Visit (INDEPENDENT_AMBULATORY_CARE_PROVIDER_SITE_OTHER): Payer: PPO

## 2023-08-10 DIAGNOSIS — I480 Paroxysmal atrial fibrillation: Secondary | ICD-10-CM | POA: Diagnosis not present

## 2023-08-10 LAB — CUP PACEART REMOTE DEVICE CHECK
Date Time Interrogation Session: 20250504230323
Implantable Pulse Generator Implant Date: 20200918

## 2023-08-17 ENCOUNTER — Ambulatory Visit (HOSPITAL_COMMUNITY)
Admission: RE | Admit: 2023-08-17 | Discharge: 2023-08-17 | Disposition: A | Discharge status: Home / Self Care | Source: Ambulatory Visit | Attending: Urology | Admitting: Urology

## 2023-08-17 DIAGNOSIS — N4289 Other specified disorders of prostate: Secondary | ICD-10-CM | POA: Diagnosis not present

## 2023-08-17 DIAGNOSIS — K5730 Diverticulosis of large intestine without perforation or abscess without bleeding: Secondary | ICD-10-CM | POA: Diagnosis not present

## 2023-08-17 DIAGNOSIS — R972 Elevated prostate specific antigen [PSA]: Secondary | ICD-10-CM | POA: Diagnosis not present

## 2023-08-17 DIAGNOSIS — M65951 Unspecified synovitis and tenosynovitis, right thigh: Secondary | ICD-10-CM | POA: Diagnosis not present

## 2023-08-17 MED ORDER — GADOBUTROL 1 MMOL/ML IV SOLN
7.0000 mL | Freq: Once | INTRAVENOUS | Status: AC | PRN
  Administered 2023-08-17: 7 mL via INTRAVENOUS

## 2023-08-18 ENCOUNTER — Ambulatory Visit: Payer: Self-pay | Admitting: Cardiovascular Disease

## 2023-08-19 ENCOUNTER — Telehealth: Payer: Self-pay | Admitting: Urology

## 2023-08-19 NOTE — Telephone Encounter (Signed)
 Would like to discuss MRI results he received on Mychart

## 2023-08-19 NOTE — Telephone Encounter (Signed)
 Patient is made aware once Dr. Claretta Croft review the MRI someone will call him with his result and recommendation. Patient voiced understanding.

## 2023-08-21 NOTE — Progress Notes (Signed)
 Carelink Summary Report / Loop Recorder

## 2023-08-27 DIAGNOSIS — M25551 Pain in right hip: Secondary | ICD-10-CM | POA: Diagnosis not present

## 2023-09-01 ENCOUNTER — Telehealth: Payer: Self-pay

## 2023-09-01 DIAGNOSIS — R972 Elevated prostate specific antigen [PSA]: Secondary | ICD-10-CM

## 2023-09-01 NOTE — Telephone Encounter (Signed)
-----   Message from Johnie Nailer sent at 08/25/2023  9:00 AM EDT ----- He need referral to [facility] ----- Message ----- From: Audra Blend, CMA Sent: 08/19/2023   2:58 PM EDT To: Marco Severs, MD  Please review

## 2023-09-01 NOTE — Telephone Encounter (Signed)
 Patient does not have a scheduled follow up until October.  Can you please review MRI and advise.

## 2023-09-01 NOTE — Telephone Encounter (Signed)
Patient called back requesting MRI results

## 2023-09-02 NOTE — Telephone Encounter (Signed)
 Patient would like a call today about MRI

## 2023-09-02 NOTE — Telephone Encounter (Signed)
Please see 5/14 telephone encounter

## 2023-09-02 NOTE — Telephone Encounter (Signed)
 Patient is aware of MD response.  He would like to discuss some concern with Dr. Claretta Croft before scheduling the fusion biopsy.  Appt scheduled.

## 2023-09-09 DIAGNOSIS — M7071 Other bursitis of hip, right hip: Secondary | ICD-10-CM | POA: Diagnosis not present

## 2023-09-09 DIAGNOSIS — M25552 Pain in left hip: Secondary | ICD-10-CM | POA: Diagnosis not present

## 2023-09-09 DIAGNOSIS — M1611 Unilateral primary osteoarthritis, right hip: Secondary | ICD-10-CM | POA: Diagnosis not present

## 2023-09-10 ENCOUNTER — Ambulatory Visit (INDEPENDENT_AMBULATORY_CARE_PROVIDER_SITE_OTHER)

## 2023-09-10 DIAGNOSIS — I480 Paroxysmal atrial fibrillation: Secondary | ICD-10-CM | POA: Diagnosis not present

## 2023-09-10 LAB — CUP PACEART REMOTE DEVICE CHECK
Date Time Interrogation Session: 20250604230440
Implantable Pulse Generator Implant Date: 20200918

## 2023-09-11 ENCOUNTER — Ambulatory Visit: Admitting: Urology

## 2023-09-11 ENCOUNTER — Ambulatory Visit: Payer: Self-pay | Admitting: Cardiovascular Disease

## 2023-09-11 ENCOUNTER — Encounter: Payer: Self-pay | Admitting: Urology

## 2023-09-11 VITALS — BP 162/79 | HR 106

## 2023-09-11 DIAGNOSIS — R972 Elevated prostate specific antigen [PSA]: Secondary | ICD-10-CM | POA: Diagnosis not present

## 2023-09-11 NOTE — Progress Notes (Signed)
 09/11/2023 10:02 AM   Luis [name] 1945-12-13 [id number]  Referring provider: Windell Hasty, DO [address] [place],  [place] [address]  Elevated PSA   HPI: [name] is a 78yo here for followup for elevated PSA. PSA was 8.0 and an IsoPSa was obtained. IsoPSA was 25.9. MRI of the prostate shows a PIRADs 5 lesion left anterior transition zone.    PMH: Past Medical History:  Diagnosis Date   Allergy    SEASONAL   Arthritis    BACK AND KNEES   Complication of anesthesia    pt states aspirated following ablation    Coronary artery disease    Dysrhythmia    GERD (gastroesophageal reflux disease)    History of kidney stones 07/2006   x 1   Hyperlipidemia    Hypertension    Normal coronary arteries 2008   Normal LV function   Paroxysmal atrial fibrillation (HCC)    2008 and 62952, PVI 09/07/12    Surgical History: Past Surgical History:  Procedure Laterality Date   ATRIAL ABLATION SURGERY  09/07/2012   PVI by Dr Nunzio Belch   ATRIAL FIBRILLATION ABLATION N/A 09/07/2012   Procedure: ATRIAL FIBRILLATION ABLATION;  Surgeon: Jolly Needle, MD;  Location: [facility] CATH LAB;  Service: Cardiovascular;  Laterality: N/A;   CARDIAC CATHETERIZATION Left 07/20/2006   No significant CAD, EF 60%, continue medical therapy   COLONOSCOPY  05/13/2021   CYSTOSCOPY W/ RETROGRADES Left 02/28/2019   Procedure: CYSTOSCOPY WITH RETROGRADE PYELOGRAM;  Surgeon: Roxane Copp, MD;  Location: [facility] ORS;  Service: Urology;  Laterality: Left;   CYSTOSCOPY/URETEROSCOPY/HOLMIUM LASER/STENT PLACEMENT Left 03/08/2019   Procedure: CYSTOSCOPY/URETEROSCOPY/HOLMIUM LASER/STENT PLACEMENT;  Surgeon: Roxane Copp, MD;  Location: [facility];  Service: Urology;  Laterality: Left;  1 HR   EP IMPLANTABLE DEVICE N/A 01/23/2015   Procedure: Loop Recorder Insertion;  Surgeon: Jolly Needle, MD;  Location: [facility] INVASIVE CV LAB;  Service: Cardiovascular;  Laterality: N/A;   implantable loop recorder placement   12/24/2018   previous ILR removed and a new MDT Reveal LINQ2 implanted in its place in office by Dr Nunzio Belch   KNEE ARTHROSCOPY Right    TEE WITHOUT CARDIOVERSION N/A 09/06/2012   Procedure: TRANSESOPHAGEAL ECHOCARDIOGRAM (TEE);  Surgeon: Hazle Lites, MD;  Location: [facility] ENDOSCOPY;  Service: Cardiovascular;  Laterality: N/A;   TOTAL KNEE ARTHROPLASTY Right 12/04/2020   Procedure: TOTAL KNEE ARTHROPLASTY;  Surgeon: Claiborne Crew, MD;  Location: [facility] ORS;  Service: Orthopedics;  Laterality: Right;   UPPER GASTROINTESTINAL ENDOSCOPY      Home Medications:  Allergies as of 09/11/2023       Reactions   Ciprofloxacin  Rash        Medication List        Accurate as of September 11, 2023 10:02 AM. If you have any questions, ask your nurse or doctor.          aspirin  EC 81 MG tablet Take 1 tablet (81 mg total) by mouth in the morning. Resume once daily aspirin  after completing 28 days of twice daily dosing. Swallow whole.   metoprolol  succinate 50 MG 24 hr tablet Commonly known as: TOPROL -XL Take 1 tablet (50 mg total) by mouth daily. Take with or immediately following a meal.   omeprazole  20 MG capsule Commonly known as: PRILOSEC Take 20 mg by mouth in the morning.   predniSONE 10 MG tablet Commonly known as: DELTASONE Take 10 mg by mouth.   rosuvastatin  10 MG tablet Commonly known as: CRESTOR   Take 1 tablet by mouth once daily   tadalafil  20 MG tablet Commonly known as: CIALIS  Take 1 tablet (20 mg total) by mouth daily as needed.        Allergies:  Allergies  Allergen Reactions   Ciprofloxacin  Rash    Family History: Family History  Problem Relation Age of Onset   Liver cancer Mother    CAD Father    Heart Problems Father    Stroke Father    Hypertension Father    Heart Problems Brother 37       Had stent put in   Stomach cancer Neg Hx    Prostate cancer Neg Hx    Colon cancer Neg Hx     Social History:  reports that he quit smoking about 28 years ago. His  smoking use included cigarettes. He started smoking about 63 years ago. He has never used smokeless tobacco. He reports current alcohol  use of about 7.0 standard drinks of alcohol  per week. He reports that he does not use drugs.  ROS: All other review of systems were reviewed and are negative except what is noted above in HPI  Physical Exam: BP (!) 162/79   Pulse (!) 106   Constitutional:  Alert and oriented, No acute distress. HEENT: [place] AT, moist mucus membranes.  Trachea midline, no masses. Cardiovascular: No clubbing, cyanosis, or edema. Respiratory: Normal respiratory effort, no increased work of breathing. GI: Abdomen is soft, nontender, nondistended, no abdominal masses GU: No CVA tenderness.  Lymph: No cervical or inguinal lymphadenopathy. Skin: No rashes, bruises or suspicious lesions. Neurologic: Grossly intact, no focal deficits, moving all 4 extremities. Psychiatric: Normal mood and affect.  Laboratory Data: Lab Results  Component Value Date   WBC 9.3 12/05/2020   HGB 13.4 12/05/2020   HCT 37.6 (L) 12/05/2020   MCV 95.9 12/05/2020   PLT 129 (L) 12/05/2020    Lab Results  Component Value Date   CREATININE 1.06 12/05/2020    No results found for: "PSA"  No results found for: "TESTOSTERONE"  No results found for: "HGBA1C"  Urinalysis    Component Value Date/Time   COLORURINE YELLOW 09/14/2012 1537   APPEARANCEUR Clear 04/15/2023 0834   LABSPEC 1.027 09/14/2012 1537   PHURINE 5.0 09/14/2012 1537   GLUCOSEU Negative 04/15/2023 0834   HGBUR NEG 09/14/2012 1537   BILIRUBINUR negative 07/17/2023 0829   BILIRUBINUR Negative 04/15/2023 0834   KETONESUR NEG 09/14/2012 1537   PROTEINUR Negative 07/17/2023 0829   PROTEINUR Negative 04/15/2023 0834   PROTEINUR NEG 09/14/2012 1537   UROBILINOGEN 0.2 07/17/2023 0829   UROBILINOGEN 1 09/14/2012 1537   NITRITE negative 07/17/2023 0829   NITRITE Negative 04/15/2023 0834   NITRITE NEG 09/14/2012 1537   LEUKOCYTESUR  Moderate (2+) (A) 07/17/2023 0829   LEUKOCYTESUR 2+ (A) 04/15/2023 0834    Lab Results  Component Value Date   LABMICR See below: 04/15/2023   WBCUA 6-10 (A) 04/15/2023   LABEPIT 0-10 04/15/2023   BACTERIA None seen 04/15/2023    Pertinent Imaging: MRi prostate 08/17/2023: Images reviewed and discussed with the patient  Results for orders placed during the hospital encounter of 07/10/23  Abdomen 1 view (KUB)  Narrative CLINICAL DATA:  History of nephrolithiasis  EXAM: ABDOMEN - 1 VIEW  COMPARISON:  03/21/2022  FINDINGS: Scattered large and small bowel gas is noted. Kidneys are well visualize without renal or ureteral calculi. Vascular calcifications are seen. Degenerative changes of lumbar spine are noted. No free air is seen.  IMPRESSION:  No evidence of nephrolithiasis.   Electronically Signed By: Violeta Grey M.D. On: 07/19/2023 23:53  No results found for this or any previous visit.  No results found for this or any previous visit.  No results found for this or any previous visit.  No results found for this or any previous visit.  No results found for this or any previous visit.  No results found for this or any previous visit.  No results found for this or any previous visit.   Assessment & Plan:    1. Elevated PSA (Primary) -referral to [facility] for MRI Fusion biopsy   No follow-ups on file.  Johnie Nailer, MD  [facility] [place]

## 2023-09-11 NOTE — Patient Instructions (Signed)
 Transrectal Ultrasound-Guided Prostate Biopsy A transrectal ultrasound-guided prostate biopsy is a procedure to remove samples of prostate tissue for testing. The prostate is a walnut-sized gland that is located below the bladder and in front of the rectum. During this procedure, a small device (probe) is lubricated and put inside the rectum. The probe sends out sound waves that make a picture of the prostate and surrounding tissues (transrectal ultrasound). The images are used to help guide the process of removing the samples. The samples are taken to a lab to be checked for prostate cancer. This procedure is usually done to evaluate the prostate gland of men who have raised (elevated) levels of prostate-specific antigen (PSA), which can be a sign of prostate cancer or prostate enlargement related to aging (benign prostatic hyperplasia, or BPH). Tell a health care provider about: Any allergies you have. All medicines you are taking, including vitamins, herbs, eye drops, creams, and over-the-counter medicines. Any problems you or family members have had with anesthetic medicines. Any bleeding problems you have. Any surgeries you have had. Any medical conditions you have. Any prostate infections you have had. What are the risks? Generally, this is a safe procedure. However, problems may occur, including: Prostate infection. Bleeding from the rectum. Blood in the urine. Allergic reactions to medicines. Damage to surrounding structures such as blood vessels, organs, or muscles. Difficulty passing urine. Nerve damage. This is usually temporary. What happens before the procedure? Medicines Ask your health care provider about: Changing or stopping your regular medicines. This is especially important if you are taking diabetes medicines or blood thinners. Taking medicines such as aspirin and ibuprofen. These medicines can thin your blood. Do not take these medicines unless your health care provider  tells you to take them. Taking over-the-counter medicines, vitamins, herbs, and supplements. General instructions Follow instructions from your health care provider about eating and drinking. In most instances, you will not need to stop eating and drinking completely before the procedure. You will be given an enema. During an enema, a liquid is injected into your rectum to clear out waste. You may have a blood or urine sample taken. Ask your health care provider what steps will be taken to help prevent infection. These steps may include: Washing skin with a germ-killing soap. Taking antibiotic medicine. If you will be going home right after the procedure, plan to have a responsible adult: Take you home from the hospital or clinic. You will not be allowed to drive. Care for you for the time you are told. What happens during the procedure?  An IV will be inserted into one of your veins. You will be given one or both of the following: A medicine to help you relax (sedative). A medicine to numb the area (local anesthetic). You will be placed on your left side, and your knees will be bent toward your chest. A probe with lubricated gel will be placed into your rectum, and images will be taken of your prostate and surrounding structures. Numbing medicine will be injected into your prostate. A biopsy needle will be inserted through your rectum or perineum and guided to your prostate using the ultrasound images. Prostate tissue samples will be removed, and the needle and probe will then be removed. The biopsy samples will be sent to a lab to be tested. The procedure may vary among health care providers and hospitals. What happens after the procedure? Your blood pressure, heart rate, breathing rate, and blood oxygen level will be monitored until you leave  the hospital or clinic. You may have some discomfort in the rectal area. You will be given pain medicine as needed. If you were given a sedative  during the procedure, it can affect you for several hours. Do not drive or operate machinery until your health care provider says that it is safe. It is up to you to get the results of your procedure. Ask your health care provider, or the department that is doing the procedure, when your results will be ready. Keep all follow-up visits. This is important. Summary A transrectal ultrasound-guided biopsy removes samples of tissue from your prostate using ultrasound-guided sound waves to help guide the process. This procedure is usually done to evaluate the prostate gland of men who have raised (elevated) levels of prostate-specific antigen (PSA), which can be a sign of prostate cancer or prostate enlargement related to aging. After your procedure, you may feel some discomfort in the rectal area. Plan to have a responsible adult take you home from the hospital or clinic, and follow up with your health care provider for your results. This information is not intended to replace advice given to you by your health care provider. Make sure you discuss any questions you have with your health care provider. Document Revised: 09/17/2020 Document Reviewed: 09/17/2020 Elsevier Patient Education  2024 [employer].

## 2023-09-29 NOTE — Progress Notes (Signed)
 Carelink Summary Report / Loop Recorder

## 2023-09-30 DIAGNOSIS — R8271 Bacteriuria: Secondary | ICD-10-CM | POA: Diagnosis not present

## 2023-09-30 DIAGNOSIS — R972 Elevated prostate specific antigen [PSA]: Secondary | ICD-10-CM | POA: Diagnosis not present

## 2023-09-30 DIAGNOSIS — D075 Carcinoma in situ of prostate: Secondary | ICD-10-CM | POA: Diagnosis not present

## 2023-09-30 DIAGNOSIS — C61 Malignant neoplasm of prostate: Secondary | ICD-10-CM | POA: Diagnosis not present

## 2023-10-02 ENCOUNTER — Other Ambulatory Visit: Payer: Self-pay

## 2023-10-02 ENCOUNTER — Encounter (HOSPITAL_COMMUNITY): Payer: Self-pay

## 2023-10-02 ENCOUNTER — Emergency Department (HOSPITAL_COMMUNITY): Admission: EM | Admit: 2023-10-02 | Discharge: 2023-10-02 | Disposition: A | Discharge status: Home / Self Care

## 2023-10-02 DIAGNOSIS — R6889 Other general symptoms and signs: Secondary | ICD-10-CM | POA: Insufficient documentation

## 2023-10-02 DIAGNOSIS — Z9889 Other specified postprocedural states: Secondary | ICD-10-CM | POA: Insufficient documentation

## 2023-10-02 DIAGNOSIS — R319 Hematuria, unspecified: Secondary | ICD-10-CM

## 2023-10-02 DIAGNOSIS — Z7982 Long term (current) use of aspirin: Secondary | ICD-10-CM | POA: Insufficient documentation

## 2023-10-02 DIAGNOSIS — R6883 Chills (without fever): Secondary | ICD-10-CM | POA: Diagnosis present

## 2023-10-02 DIAGNOSIS — N390 Urinary tract infection, site not specified: Secondary | ICD-10-CM | POA: Diagnosis not present

## 2023-10-02 LAB — CBC
HCT: 42.2 % (ref 39.0–52.0)
Hemoglobin: 14.3 g/dL (ref 13.0–17.0)
MCH: 31.9 pg (ref 26.0–34.0)
MCHC: 33.9 g/dL (ref 30.0–36.0)
MCV: 94.2 fL (ref 80.0–100.0)
Platelets: 131 10*3/uL — ABNORMAL LOW (ref 150–400)
RBC: 4.48 MIL/uL (ref 4.22–5.81)
RDW: 13.2 % (ref 11.5–15.5)
WBC: 5 10*3/uL (ref 4.0–10.5)
nRBC: 0 % (ref 0.0–0.2)

## 2023-10-02 LAB — HEPATIC FUNCTION PANEL
ALT: 22 U/L (ref 0–44)
AST: 21 U/L (ref 15–41)
Albumin: 3 g/dL — ABNORMAL LOW (ref 3.5–5.0)
Alkaline Phosphatase: 78 U/L (ref 38–126)
Bilirubin, Direct: 0.2 mg/dL (ref 0.0–0.2)
Indirect Bilirubin: 0.9 mg/dL (ref 0.3–0.9)
Total Bilirubin: 1.1 mg/dL (ref 0.0–1.2)
Total Protein: 6.3 g/dL — ABNORMAL LOW (ref 6.5–8.1)

## 2023-10-02 LAB — URINALYSIS, W/ REFLEX TO CULTURE (INFECTION SUSPECTED)
Bilirubin Urine: NEGATIVE
Glucose, UA: NEGATIVE mg/dL
Ketones, ur: 20 mg/dL — AB
Nitrite: NEGATIVE
Protein, ur: 100 mg/dL — AB
Specific Gravity, Urine: 1.02 (ref 1.005–1.030)
pH: 7 (ref 5.0–8.0)

## 2023-10-02 LAB — BASIC METABOLIC PANEL WITH GFR
Anion gap: 11 (ref 5–15)
BUN: 18 mg/dL (ref 8–23)
CO2: 26 mmol/L (ref 22–32)
Calcium: 10 mg/dL (ref 8.9–10.3)
Chloride: 97 mmol/L — ABNORMAL LOW (ref 98–111)
Creatinine, Ser: 0.96 mg/dL (ref 0.61–1.24)
GFR, Estimated: >60 mL/min (ref >60)
Glucose, Bld: 131 mg/dL — ABNORMAL HIGH (ref 70–99)
Potassium: 4.1 mmol/L (ref 3.5–5.1)
Sodium: 134 mmol/L — ABNORMAL LOW (ref 135–145)

## 2023-10-02 LAB — LIPASE, BLOOD: Lipase: 25 U/L (ref 11–51)

## 2023-10-02 LAB — I-STAT CG4 LACTIC ACID, ED
Lactic Acid, Venous: 1.3 mmol/L (ref 0.5–1.9)
Lactic Acid, Venous: 1 mmol/L (ref 0.5–1.9)

## 2023-10-02 MED ORDER — CEPHALEXIN 500 MG PO CAPS
500.0000 mg | ORAL_CAPSULE | Freq: Once | ORAL | Status: AC
  Administered 2023-10-02: 500 mg via ORAL
  Filled 2023-10-02: qty 1

## 2023-10-02 MED ORDER — SODIUM CHLORIDE 0.9 % IV BOLUS
500.0000 mL | Freq: Once | INTRAVENOUS | Status: AC
  Administered 2023-10-02: 500 mL via INTRAVENOUS

## 2023-10-02 MED ORDER — CEPHALEXIN 500 MG PO CAPS: 500.0000 mg | ORAL_CAPSULE | Freq: Four times a day (QID) | ORAL | 0 refills | Status: AC

## 2023-10-02 MED ORDER — ONDANSETRON HCL 4 MG/2ML IJ SOLN
4.0000 mg | Freq: Once | INTRAMUSCULAR | Status: AC
Start: 2023-10-02 — End: 2023-10-02
  Administered 2023-10-02: 4 mg via INTRAVENOUS
  Filled 2023-10-02: qty 2

## 2023-10-02 NOTE — ED Provider Notes (Signed)
 [place] EMERGENCY DEPARTMENT AT [facility] Provider Note   CSN: [id number] Arrival date & time: 10/02/23  1726     Patient presents with: Post-op Problem   Luis [name] is a 78 y.o. male.   78 year old male presents for evaluation of chills and bodyaches that started last night.  States 2 days ago he had a prostate biopsy and was given 1 dose of prophylactic antibiotics.  He states last night he developed rigors that were uncontrollable as well as chills.  He states it went away but then later today he developed another episode of this as well as an episode of vomiting.  Denies any pain, but states he overall just does not feel good.  States he has been urinating blood and passing some blood clots.  Denies any dysuria, diarrhea, cough, congestion, chest pain, or any other symptoms or concerns at this time.        Prior to Admission medications   Medication Sig Start Date End Date Taking? Authorizing Provider  cephALEXin (KEFLEX) 500 MG capsule Take 1 capsule (500 mg total) by mouth 4 (four) times daily for 7 days. 10/02/23 10/09/23 Yes Azelie Noguera L, DO  aspirin  EC 81 MG tablet Take 1 tablet (81 mg total) by mouth in the morning. Resume once daily aspirin  after completing 28 days of twice daily dosing. Swallow whole. 12/05/20   Ernie Cough, MD  metoprolol  succinate (TOPROL -XL) 50 MG 24 hr tablet Take 1 tablet (50 mg total) by mouth daily. Take with or immediately following a meal. 12/11/22 03/11/23  Court Dorn PARAS, MD  omeprazole  (PRILOSEC) 20 MG capsule Take 20 mg by mouth in the morning.    [provider]  predniSONE (DELTASONE) 10 MG tablet Take 10 mg by mouth. 08/07/23   [provider]  rosuvastatin  (CRESTOR ) 10 MG tablet Take 1 tablet by mouth once daily 12/12/22   Court Dorn PARAS, MD  tadalafil  (CIALIS ) 20 MG tablet Take 1 tablet (20 mg total) by mouth daily as needed. 07/17/23   McKenzie, Belvie CROME, MD    Allergies: Ciprofloxacin      Review of Systems  Constitutional:  Positive for chills and fatigue. Negative for fever.  HENT:  Negative for ear pain and sore throat.   Eyes:  Negative for pain and visual disturbance.  Respiratory:  Negative for cough and shortness of breath.   Cardiovascular:  Negative for chest pain and palpitations.  Gastrointestinal:  Negative for abdominal pain and vomiting.  Genitourinary:  Positive for hematuria. Negative for dysuria.  Musculoskeletal:  Negative for arthralgias and back pain.  Skin:  Negative for color change and rash.  Neurological:  Negative for seizures and syncope.  All other systems reviewed and are negative.   Updated Vital Signs BP (!) 153/78 (BP Location: Left Arm)   Pulse (!) 108   Temp 98.3 F (36.8 C) (Oral)   Resp 18   SpO2 99%   Physical Exam Vitals and nursing note reviewed.  Constitutional:      General: He is not in acute distress.    Appearance: Normal appearance. He is well-developed. He is not ill-appearing.  HENT:     Head: Normocephalic and atraumatic.   Eyes:     Conjunctiva/sclera: Conjunctivae normal.    Cardiovascular:     Rate and Rhythm: Normal rate and regular rhythm.     Pulses: Normal pulses.     Heart sounds: Normal heart sounds. No murmur heard. Pulmonary:     Effort: Pulmonary  effort is normal. No respiratory distress.     Breath sounds: Normal breath sounds. No stridor. No wheezing or rhonchi.  Abdominal:     General: There is no distension.     Palpations: Abdomen is soft. There is no mass.     Tenderness: There is no abdominal tenderness.     Hernia: No hernia is present.   Musculoskeletal:        General: No swelling.     Cervical back: Neck supple.   Skin:    General: Skin is warm and dry.     Capillary Refill: Capillary refill takes less than 2 seconds.   Neurological:     General: No focal deficit present.     Mental Status: He is alert.   Psychiatric:        Mood and Affect: Mood normal.     (all  labs ordered are listed, but only abnormal results are displayed) Labs Reviewed  CBC - Abnormal; Notable for the following components:      Result Value   Platelets 131 (*)    All other components within normal limits  BASIC METABOLIC PANEL WITH GFR - Abnormal; Notable for the following components:   Sodium 134 (*)    Chloride 97 (*)    Glucose, Bld 131 (*)    All other components within normal limits  HEPATIC FUNCTION PANEL - Abnormal; Notable for the following components:   Total Protein 6.3 (*)    Albumin 3.0 (*)    All other components within normal limits  URINALYSIS, W/ REFLEX TO CULTURE (INFECTION SUSPECTED) - Abnormal; Notable for the following components:   Color, Urine RED (*)    APPearance CLOUDY (*)    Hgb urine dipstick LARGE (*)    Ketones, ur 20 (*)    Protein, ur 100 (*)    Leukocytes,Ua SMALL (*)    All other components within normal limits  LIPASE, BLOOD  I-STAT CG4 LACTIC ACID, ED  I-STAT CG4 LACTIC ACID, ED    EKG: None  Radiology: No results found.   Procedures   Medications Ordered in the ED  ondansetron  (ZOFRAN ) injection 4 mg (4 mg Intravenous Given 10/02/23 2043)  sodium chloride  0.9 % bolus 500 mL (0 mLs Intravenous Stopped 10/02/23 2136)  cephALEXin (KEFLEX) capsule 500 mg (500 mg Oral Given 10/02/23 2324)                                    Medical Decision Making Medical Decision Making Nursing notes are reviewed. Differential diagnosis for this patient would include but not limited to: UTI, electrolyte abnormality, AKI, dehydration, influenza, other  Records reviewed: Prior records reviewed and patient had a prostate biopsy 2 days ago  Emergency Department Course:  Vital signs and pulse oximetry are reviewed, evaluated by myself and found to be within normal limits prior to final disposition. Findings of laboratory testing and medical imaging are discussed with patient and family that is available. Patient agrees with the  medical care plan as follows:  Patient's lab workup reviewed by me and he has some leukocyte esterase in his urine but otherwise fairly unremarkable.  Considering his recent surgery we will start him on antibiotics.  Will start him on Keflex.  Vitals are stable he is afebrile here.  Advised Tylenol  as needed for pain and close follow-up with his urologist.  Advise return for new or worsening symptoms.  He  feels comfortable plan to be discharged home.  Problems Addressed: History of prostate biopsy: chronic illness or injury Rigors: acute illness or injury Urinary tract infection with hematuria, site unspecified: acute illness or injury  Amount and/or Complexity of Data Reviewed External Data Reviewed: notes. Labs: ordered. Decision-making details documented in ED Course.  Risk OTC drugs. Prescription drug management. Drug therapy requiring intensive monitoring for toxicity.     Final diagnoses:  Urinary tract infection with hematuria, site unspecified  History of prostate biopsy  Rigors    ED Discharge Orders          Ordered    cephALEXin (KEFLEX) 500 MG capsule  4 times daily        10/02/23 2309               Gennaro Bouchard L, DO 10/02/23 2355

## 2023-10-02 NOTE — ED Notes (Signed)
 Pt assisted to restroom; urine sample was obtained and sent to lab.

## 2023-10-02 NOTE — ED Triage Notes (Addendum)
 Pt came in for weakness, tremors, and vomiting this morning. His vomiting was slightly red, but stated he had a red gatorade earlier. Pt had an infusion biopsy on Wednesday and was told to watch out for any reactions or symptoms.

## 2023-10-02 NOTE — Discharge Instructions (Signed)
 Take your antibiotics as prescribed.  You can use Tylenol  as needed for pain and fever.  Call your urologist on Monday for a follow-up appointment.  Return to the ER if your symptoms worsen.

## 2023-10-12 ENCOUNTER — Ambulatory Visit

## 2023-10-12 DIAGNOSIS — N453 Epididymo-orchitis: Secondary | ICD-10-CM | POA: Diagnosis not present

## 2023-10-12 DIAGNOSIS — I480 Paroxysmal atrial fibrillation: Secondary | ICD-10-CM | POA: Diagnosis not present

## 2023-10-12 LAB — CUP PACEART REMOTE DEVICE CHECK
Date Time Interrogation Session: 20250706231308
Implantable Pulse Generator Implant Date: 20200918

## 2023-10-13 ENCOUNTER — Ambulatory Visit: Payer: Self-pay | Admitting: Cardiovascular Disease

## 2023-10-19 ENCOUNTER — Ambulatory Visit: Admitting: Urology

## 2023-10-19 ENCOUNTER — Encounter: Payer: Self-pay | Admitting: Urology

## 2023-10-19 VITALS — BP 145/79 | HR 79

## 2023-10-19 DIAGNOSIS — C61 Malignant neoplasm of prostate: Secondary | ICD-10-CM | POA: Diagnosis not present

## 2023-10-19 NOTE — Patient Instructions (Signed)

## 2023-10-19 NOTE — Progress Notes (Signed)
 10/19/2023 4:11 PM   Luis [name] Portugal 1946/02/26 [id number]  Referring provider: Valentin Skates, DO [address] [place],  [place] [address]  Followup prostate biopsy   HPI: Mr Luis [name] is a 78yo here for followup after MRI Fusion biopsy. Biopsy revealed Gleason 4+3=7 in 3/3 cores ROI, gleason 3+4=7 in 1/12 cores and gleason 3+3=6 in 2/12 cores. PSA 8.0. Prostate volume 23cc. He developed a prostate infection after his biopsy which was treated with keflex  for 1 week. He then developed epididymo-orchitis and is currently on a 14 day course of bactrim .    PMH: Past Medical History:  Diagnosis Date   Allergy    SEASONAL   Arthritis    BACK AND KNEES   Complication of anesthesia    pt states aspirated following ablation    Coronary artery disease    Dysrhythmia    GERD (gastroesophageal reflux disease)    History of kidney stones 07/2006   x 1   Hyperlipidemia    Hypertension    Normal coronary arteries 2008   Normal LV function   Paroxysmal atrial fibrillation (HCC)    2008 and 79985, PVI 09/07/12    Surgical History: Past Surgical History:  Procedure Laterality Date   ATRIAL ABLATION SURGERY  09/07/2012   PVI by Dr Kelsie   ATRIAL FIBRILLATION ABLATION N/A 09/07/2012   Procedure: ATRIAL FIBRILLATION ABLATION;  Surgeon: Lynwood Kelsie, MD;  Location: [facility] CATH LAB;  Service: Cardiovascular;  Laterality: N/A;   CARDIAC CATHETERIZATION Left 07/20/2006   No significant CAD, EF 60%, continue medical therapy   COLONOSCOPY  05/13/2021   CYSTOSCOPY W/ RETROGRADES Left 02/28/2019   Procedure: CYSTOSCOPY WITH RETROGRADE PYELOGRAM;  Surgeon: Elisabeth Valli BIRCH, MD;  Location: [facility];  Service: Urology;  Laterality: Left;   CYSTOSCOPY/URETEROSCOPY/HOLMIUM LASER/STENT PLACEMENT Left 03/08/2019   Procedure: CYSTOSCOPY/URETEROSCOPY/HOLMIUM LASER/STENT PLACEMENT;  Surgeon: Elisabeth Valli BIRCH, MD;  Location: [facility];  Service: Urology;  Laterality: Left;  1 HR   EP IMPLANTABLE  DEVICE N/A 01/23/2015   Procedure: Loop Recorder Insertion;  Surgeon: Lynwood Kelsie, MD;  Location: [facility] INVASIVE CV LAB;  Service: Cardiovascular;  Laterality: N/A;   implantable loop recorder placement  12/24/2018   previous ILR removed and a new MDT Reveal LINQ2 implanted in its place in office by Dr Kelsie   KNEE ARTHROSCOPY Right    TEE WITHOUT CARDIOVERSION N/A 09/06/2012   Procedure: TRANSESOPHAGEAL ECHOCARDIOGRAM (TEE);  Surgeon: Vinie KYM Maxcy, MD;  Location: [facility] ENDOSCOPY;  Service: Cardiovascular;  Laterality: N/A;   TOTAL KNEE ARTHROPLASTY Right 12/04/2020   Procedure: TOTAL KNEE ARTHROPLASTY;  Surgeon: Ernie Cough, MD;  Location: [facility];  Service: Orthopedics;  Laterality: Right;   UPPER GASTROINTESTINAL ENDOSCOPY      Home Medications:  Allergies as of 10/19/2023       Reactions   Ciprofloxacin  Rash        Medication List        Accurate as of October 19, 2023  4:11 PM. If you have any questions, ask your nurse or doctor.          aspirin  EC 81 MG tablet Take 1 tablet (81 mg total) by mouth in the morning. Resume once daily aspirin  after completing 28 days of twice daily dosing. Swallow whole.   metoprolol  succinate 50 MG 24 hr tablet Commonly known as: TOPROL -XL Take 1 tablet (50 mg total) by mouth daily. Take with or immediately following a meal.   omeprazole  20 MG capsule Commonly known as: PRILOSEC  Take 20 mg by mouth in the morning.   predniSONE 10 MG tablet Commonly known as: DELTASONE Take 10 mg by mouth.   rosuvastatin  10 MG tablet Commonly known as: CRESTOR  Take 1 tablet by mouth once daily   tadalafil  20 MG tablet Commonly known as: CIALIS  Take 1 tablet (20 mg total) by mouth daily as needed.        Allergies:  Allergies  Allergen Reactions   Ciprofloxacin  Rash    Family History: Family History  Problem Relation Age of Onset   Liver cancer Mother    CAD Father    Heart Problems Father    Stroke Father    Hypertension Father     Heart Problems Brother 42       Had stent put in   Stomach cancer Neg Hx    Prostate cancer Neg Hx    Colon cancer Neg Hx     Social History:  reports that he quit smoking about 28 years ago. His smoking use included cigarettes. He started smoking about 63 years ago. He has never used smokeless tobacco. He reports current alcohol  use of about 7.0 standard drinks of alcohol  per week. He reports that he does not use drugs.  ROS: All other review of systems were reviewed and are negative except what is noted above in HPI  Physical Exam: BP (!) 145/79   Pulse 79   Constitutional:  Alert and oriented, No acute distress. HEENT: [place] AT, moist mucus membranes.  Trachea midline, no masses. Cardiovascular: No clubbing, cyanosis, or edema. Respiratory: Normal respiratory effort, no increased work of breathing. GI: Abdomen is soft, nontender, nondistended, no abdominal masses GU: No CVA tenderness.  Lymph: No cervical or inguinal lymphadenopathy. Skin: No rashes, bruises or suspicious lesions. Neurologic: Grossly intact, no focal deficits, moving all 4 extremities. Psychiatric: Normal mood and affect.  Laboratory Data: Lab Results  Component Value Date   WBC 5.0 10/02/2023   HGB 14.3 10/02/2023   HCT 42.2 10/02/2023   MCV 94.2 10/02/2023   PLT 131 (L) 10/02/2023    Lab Results  Component Value Date   CREATININE 0.96 10/02/2023    No results found for: PSA  No results found for: TESTOSTERONE  No results found for: HGBA1C  Urinalysis    Component Value Date/Time   COLORURINE RED (A) 10/02/2023 2105   APPEARANCEUR CLOUDY (A) 10/02/2023 2105   APPEARANCEUR Clear 04/15/2023 0834   LABSPEC 1.020 10/02/2023 2105   PHURINE 7.0 10/02/2023 2105   GLUCOSEU NEGATIVE 10/02/2023 2105   HGBUR LARGE (A) 10/02/2023 2105   BILIRUBINUR NEGATIVE 10/02/2023 2105   BILIRUBINUR negative 07/17/2023 0829   BILIRUBINUR Negative 04/15/2023 0834   KETONESUR 20 (A) 10/02/2023 2105    PROTEINUR 100 (A) 10/02/2023 2105   UROBILINOGEN 0.2 07/17/2023 0829   UROBILINOGEN 1 09/14/2012 1537   NITRITE NEGATIVE 10/02/2023 2105   LEUKOCYTESUR SMALL (A) 10/02/2023 2105    Lab Results  Component Value Date   LABMICR See below: 04/15/2023   WBCUA 6-10 (A) 04/15/2023   LABEPIT 0-10 04/15/2023   BACTERIA None seen 04/15/2023    Pertinent Imaging:  Results for orders placed during the hospital encounter of 07/10/23  Abdomen 1 view (KUB)  Narrative CLINICAL DATA:  History of nephrolithiasis  EXAM: ABDOMEN - 1 VIEW  COMPARISON:  03/21/2022  FINDINGS: Scattered large and small bowel gas is noted. Kidneys are well visualize without renal or ureteral calculi. Vascular calcifications are seen. Degenerative changes of lumbar spine are noted. No  free air is seen.  IMPRESSION: No evidence of nephrolithiasis.   Electronically Signed By: Oneil Devonshire M.D. On: 07/19/2023 23:53  No results found for this or any previous visit.  No results found for this or any previous visit.  No results found for this or any previous visit.  No results found for this or any previous visit.  No results found for this or any previous visit.  No results found for this or any previous visit.  No results found for this or any previous visit.   Assessment & Plan:    1. Prostate cancer (HCC) (Primary) I discussed the natural history of intermediate risk prostate cancer with the patient and the various treatment options including active surveillance, RALP, IMRT, brachytherapy, cryotherapy, HIFU and ADT. After discussing the options the patient elects for radiation therapy. I will refer him to Dr. Patrcia for consideration of radiation therapy.      No follow-ups on file.  Belvie Clara, MD  [facility] [place]

## 2023-10-19 NOTE — Addendum Note (Signed)
 Addended by: Gustaf Mccarter L on: 10/19/2023 04:27 PM   Modules accepted: Orders

## 2023-10-21 ENCOUNTER — Other Ambulatory Visit: Payer: Self-pay

## 2023-10-21 DIAGNOSIS — C61 Malignant neoplasm of prostate: Secondary | ICD-10-CM

## 2023-10-23 DIAGNOSIS — M1611 Unilateral primary osteoarthritis, right hip: Secondary | ICD-10-CM | POA: Diagnosis not present

## 2023-10-23 DIAGNOSIS — M25552 Pain in left hip: Secondary | ICD-10-CM | POA: Diagnosis not present

## 2023-10-23 DIAGNOSIS — M7071 Other bursitis of hip, right hip: Secondary | ICD-10-CM | POA: Diagnosis not present

## 2023-10-27 ENCOUNTER — Other Ambulatory Visit: Payer: Self-pay | Admitting: Urology

## 2023-10-29 ENCOUNTER — Ambulatory Visit (HOSPITAL_COMMUNITY)
Admission: RE | Admit: 2023-10-29 | Discharge: 2023-10-29 | Disposition: A | Discharge status: Home / Self Care | Source: Ambulatory Visit | Attending: Urology | Admitting: Urology

## 2023-10-29 DIAGNOSIS — C61 Malignant neoplasm of prostate: Secondary | ICD-10-CM | POA: Insufficient documentation

## 2023-10-29 MED ORDER — FLOTUFOLASTAT F 18 GALLIUM 296-5846 MBQ/ML IV SOLN
8.0000 | Freq: Once | INTRAVENOUS | Status: AC
  Administered 2023-10-29: 8.7 via INTRAVENOUS
  Filled 2023-10-29: qty 8

## 2023-10-29 NOTE — Progress Notes (Signed)
 Carelink Summary Report / Loop Recorder

## 2023-10-30 NOTE — Progress Notes (Addendum)
 GU Location of Tumor / Histology: Prostate Ca  If Prostate Cancer, Gleason Score is (4 + 3) and PSA is (8.2 on 07/10/2023)  Luis [name] presented as referral from Dr. Belvie FREDRIK Clara ([facility]) elevated PSA.  Biopsies     10/29/2023 Dr. Belvie FREDRIK Clara NM PET (PSMA) Skull to Mid Thigh CLINICAL DATA:  Intermediate risk prostate carcinoma. Staging.   IMPRESSION: Focal radiotracer accumulation in left mid prostate, consistent with primary prostate carcinoma. No evidence of local or distant metastatic disease.  08/03/2023 Dr. Belvie FREDRIK Clara MR Prostate with/without Contrast CLINICAL DATA:  Elevated PSA level. R97.20   IMPRESSION: 1. PI-RADS category 5 lesion of the left anterior transition zone in the mid gland and base with involvement of the right anterior transition zone and left anterior fibromuscular stroma. Targeting data sent to [facility]. 2. Sigmoid colon diverticulosis. 3. Mild right hip synovitis with trace right iliopsoas bursitis.  Past/Anticipated interventions by urology, if any:   Dr. Belvie FREDRIK Clara   Past/Anticipated interventions by medical oncology, if any:  NA  Weight changes, if any:   Weight loss of 10 pounds over 6 months.  IPSS:  9 SHIM:  16  Bowel/Bladder complaints, if any:  Urinary frequency and nocturia.  Denies bowel issues.  Nausea/Vomiting, if any:  No  Pain issues, if any:  0/10  SAFETY ISSUES: Prior radiation?  No Pacemaker/ICD? 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 Possible current pregnancy? Male Is the patient on methotrexate? No  Current Complaints / other details:    30 minutes spent total, including time for meaningful use questions, reviewing medication, as well as spent in face-to-face time in nurse evaluation with the patient.

## 2023-11-05 ENCOUNTER — Ambulatory Visit: Payer: Self-pay | Admitting: Urology

## 2023-11-10 ENCOUNTER — Ambulatory Visit
Admission: RE | Admit: 2023-11-10 | Discharge: 2023-11-10 | Disposition: A | Discharge status: Home / Self Care | Source: Ambulatory Visit | Attending: Radiation Oncology | Admitting: Radiation Oncology

## 2023-11-10 ENCOUNTER — Encounter: Payer: Self-pay | Admitting: Radiation Oncology

## 2023-11-10 ENCOUNTER — Other Ambulatory Visit: Payer: Self-pay

## 2023-11-10 VITALS — BP 144/70 | HR 90 | Temp 97.6°F | Resp 18 | Ht 71.0 in | Wt 167.0 lb

## 2023-11-10 DIAGNOSIS — E785 Hyperlipidemia, unspecified: Secondary | ICD-10-CM | POA: Insufficient documentation

## 2023-11-10 DIAGNOSIS — I10 Essential (primary) hypertension: Secondary | ICD-10-CM | POA: Diagnosis not present

## 2023-11-10 DIAGNOSIS — Z79899 Other long term (current) drug therapy: Secondary | ICD-10-CM | POA: Insufficient documentation

## 2023-11-10 DIAGNOSIS — K219 Gastro-esophageal reflux disease without esophagitis: Secondary | ICD-10-CM | POA: Diagnosis not present

## 2023-11-10 DIAGNOSIS — C61 Malignant neoplasm of prostate: Secondary | ICD-10-CM | POA: Insufficient documentation

## 2023-11-10 DIAGNOSIS — Z87891 Personal history of nicotine dependence: Secondary | ICD-10-CM | POA: Insufficient documentation

## 2023-11-10 DIAGNOSIS — Z800 Family history of malignant neoplasm of digestive organs: Secondary | ICD-10-CM | POA: Diagnosis not present

## 2023-11-10 DIAGNOSIS — Z7982 Long term (current) use of aspirin: Secondary | ICD-10-CM | POA: Diagnosis not present

## 2023-11-10 DIAGNOSIS — Z87442 Personal history of urinary calculi: Secondary | ICD-10-CM | POA: Insufficient documentation

## 2023-11-10 DIAGNOSIS — Z791 Long term (current) use of non-steroidal anti-inflammatories (NSAID): Secondary | ICD-10-CM | POA: Insufficient documentation

## 2023-11-10 DIAGNOSIS — M129 Arthropathy, unspecified: Secondary | ICD-10-CM | POA: Insufficient documentation

## 2023-11-10 DIAGNOSIS — I480 Paroxysmal atrial fibrillation: Secondary | ICD-10-CM | POA: Diagnosis not present

## 2023-11-10 DIAGNOSIS — J432 Centrilobular emphysema: Secondary | ICD-10-CM | POA: Diagnosis not present

## 2023-11-10 DIAGNOSIS — I2510 Atherosclerotic heart disease of native coronary artery without angina pectoris: Secondary | ICD-10-CM | POA: Insufficient documentation

## 2023-11-10 DIAGNOSIS — Z923 Personal history of irradiation: Secondary | ICD-10-CM | POA: Insufficient documentation

## 2023-11-10 DIAGNOSIS — Z191 Hormone sensitive malignancy status: Secondary | ICD-10-CM | POA: Diagnosis not present

## 2023-11-10 HISTORY — DX: Elevated prostate specific antigen (PSA): R97.20

## 2023-11-10 NOTE — Progress Notes (Signed)
Introduced myself to the patient as the prostate nurse navigator.  No barriers to care identified at this time.  He is here to discuss his radiation treatment options, and will proceed with brachytherapy.  I gave him my business card and asked him to call me with questions or concerns.  Verbalized understanding.

## 2023-11-10 NOTE — Progress Notes (Signed)
 Radiation Oncology         [phone] ________________________________  Initial Outpatient Consultation  Name: Luis [name] MRN: [id number]  Date: 11/10/2023  DOB: 28-Oct-1945  RR:Dxjxoz, Massie, DO  McKenzie, Belvie CROME, MD   REFERRING PHYSICIAN: Sherrilee Belvie CROME, MD  DIAGNOSIS: 78 y.o. gentleman with Stage T1c adenocarcinoma of the prostate with Gleason score of 4+3, and PSA of 8.0.    ICD-10-CM   1. Malignant neoplasm of prostate (HCC)  C61       HISTORY OF PRESENT ILLNESS: Luis [name] is a 78 y.o. gentleman referred for consideration of radiation therapy following recent diagnosis of prostate cancer. His PCP noted an elevated PSA of 8.0 on routine labs, prompting further evaluation with Dr. Sherrilee in urology. An IsoPSA was elevated at 25.9 and PSA was stable elevated at 8.2. MRI of the prostate on 08/11/23 revealed a PI-RADS 5 lesion in the left anterior transition zone. MRI/Ultrasound fusion-guided biopsy on 09/30/23 showed Gleason 4+3=7 (Grade Group 3) in 3 of 3 targeted cores, 3+4=7 in 1 of 12 systematic core/s, and 3+3=6 in 2 additional cores. Prostate volume was 30.6 cc. Post-biopsy, he experienced acute prostatitis treated with Keflex , followed by epididymo-orchitis requiring a 14-day course of Bactrim . Staging PSMA PET/CT 10/29/23 demonstrated focal radiotracer uptake in the left mid prostate (SUV max 11.4) consistent with known disease and no evidence of nodal or distant metastasis. He has elected to pursue definitive radiation therapy over surgical or focal options and presents today to discuss treatment planning.  The patient reviewed the biopsy and imaging with his urologist and he has kindly been referred today for discussion of potential radiation treatment options.   PREVIOUS RADIATION THERAPY: No  PAST MEDICAL HISTORY:  Past Medical History:  Diagnosis Date   Allergy    SEASONAL   Arthritis    BACK AND KNEES   Complication of anesthesia    pt states  aspirated following ablation    Coronary artery disease    Dysrhythmia    Elevated PSA    GERD (gastroesophageal reflux disease)    History of kidney stones 07/2006   x 1   Hyperlipidemia    Hypertension    Normal coronary arteries 2008   Normal LV function   Paroxysmal atrial fibrillation (HCC)    2008 and 79985, PVI 09/07/12      PAST SURGICAL HISTORY: Past Surgical History:  Procedure Laterality Date   ATRIAL ABLATION SURGERY  09/07/2012   PVI by Dr Kelsie   ATRIAL FIBRILLATION ABLATION N/A 09/07/2012   Procedure: ATRIAL FIBRILLATION ABLATION;  Surgeon: Lynwood Kelsie, MD;  Location: [facility] CATH LAB;  Service: Cardiovascular;  Laterality: N/A;   CARDIAC CATHETERIZATION Left 07/20/2006   No significant CAD, EF 60%, continue medical therapy   COLONOSCOPY  05/13/2021   CYSTOSCOPY W/ RETROGRADES Left 02/28/2019   Procedure: CYSTOSCOPY WITH RETROGRADE PYELOGRAM;  Surgeon: Elisabeth Valli BIRCH, MD;  Location: [facility];  Service: Urology;  Laterality: Left;   CYSTOSCOPY/URETEROSCOPY/HOLMIUM LASER/STENT PLACEMENT Left 03/08/2019   Procedure: CYSTOSCOPY/URETEROSCOPY/HOLMIUM LASER/STENT PLACEMENT;  Surgeon: Elisabeth Valli BIRCH, MD;  Location: [facility];  Service: Urology;  Laterality: Left;  1 HR   EP IMPLANTABLE DEVICE N/A 01/23/2015   Procedure: Loop Recorder Insertion;  Surgeon: Lynwood Kelsie, MD;  Location: [facility] INVASIVE CV LAB;  Service: Cardiovascular;  Laterality: N/A;   implantable loop recorder placement  12/24/2018   previous ILR removed and a new MDT Reveal LINQ2 implanted in its place in office by Dr Kelsie  KNEE ARTHROSCOPY Right    PROSTATE BIOPSY     TEE WITHOUT CARDIOVERSION N/A 09/06/2012   Procedure: TRANSESOPHAGEAL ECHOCARDIOGRAM (TEE);  Surgeon: Vinie KYM Maxcy, MD;  Location: [facility] ENDOSCOPY;  Service: Cardiovascular;  Laterality: N/A;   TOTAL KNEE ARTHROPLASTY Right 12/04/2020   Procedure: TOTAL KNEE ARTHROPLASTY;  Surgeon: Ernie Cough, MD;  Location: [facility];   Service: Orthopedics;  Laterality: Right;   UPPER GASTROINTESTINAL ENDOSCOPY      FAMILY HISTORY:  Family History  Problem Relation Age of Onset   Liver cancer Mother    CAD Father    Heart Problems Father    Stroke Father    Hypertension Father    Heart Problems Brother 25       Had stent put in   Stomach cancer Neg Hx    Prostate cancer Neg Hx    Colon cancer Neg Hx     SOCIAL HISTORY:  Social History   Socioeconomic History   Marital status: Married    Spouse name: Not on file   Number of children: 4   Years of education: Not on file   Highest education level: Not on file  Occupational History   Occupation: retired  Tobacco Use   Smoking status: Former    Current packs/day: 0.00    Types: Cigarettes    Start date: 11/27/1959    Quit date: 11/27/1994    Years since quitting: 28.9   Smokeless tobacco: Never  Vaping Use   Vaping status: Never Used  Substance and Sexual Activity   Alcohol  use: Yes    Alcohol /week: 7.0 standard drinks of alcohol     Types: 7 Standard drinks or equivalent per week    Comment: 3-4 oz daily 1/2 drinks per night   Drug use: No   Sexual activity: Not on file  Other Topics Concern   Not on file  Social History Narrative   Lives in [place] with spouse.   Retired Visual merchandiser   Social Drivers of Corporate investment banker Strain: Not on file  Food Insecurity: No Food Insecurity (11/10/2023)   Hunger Vital Sign    Worried About Running Out of Food in the Last Year: Never true    Ran Out of Food in the Last Year: Never true  Transportation Needs: No Transportation Needs (11/10/2023)   PRAPARE - Administrator, Civil Service (Medical): No    Lack of Transportation (Non-Medical): No  Physical Activity: Not on file  Stress: Not on file  Social Connections: Not on file  Intimate Partner Violence: Not At Risk (11/10/2023)   Humiliation, Afraid, Rape, and Kick questionnaire    Fear of Current or Ex-Partner: No     Emotionally Abused: No    Physically Abused: No    Sexually Abused: No    ALLERGIES: Ciprofloxacin   MEDICATIONS:  Current Outpatient Medications  Medication Sig Dispense Refill   celecoxib  (CELEBREX ) 200 MG capsule Take 200 mg by mouth daily.     aspirin  EC 81 MG tablet Take 1 tablet (81 mg total) by mouth in the morning. Resume once daily aspirin  after completing 28 days of twice daily dosing. Swallow whole. 30 tablet 11   metoprolol  succinate (TOPROL -XL) 50 MG 24 hr tablet Take 1 tablet (50 mg total) by mouth daily. Take with or immediately following a meal. 90 tablet 3   omeprazole  (PRILOSEC) 20 MG capsule Take 20 mg by mouth in the morning.     rosuvastatin  (CRESTOR ) 10 MG tablet Take  1 tablet by mouth once daily 90 tablet 3   No current facility-administered medications for this encounter.      REVIEW OF SYSTEMS:  On review of systems, the patient reports that he is doing well overall. He denies any chest pain, shortness of breath, cough, fevers, chills, night sweats, unintended weight changes. He denies any bowel disturbances, and denies abdominal pain, nausea or vomiting. He denies any new musculoskeletal or joint aches or pains. His IPSS was Total Score: 9, indicating moderate urinary symptoms (Reference 0-7 mild, 8-19 moderate, 20-35 severe).  His SHIM: 16, indicating he has moderate erectile dysfunction (Reference - 22-25 None, 17-21 Mild, 8-16 Moderate, 1-7 Severe). A complete review of systems is obtained and is otherwise negative.     PHYSICAL EXAM:  Wt Readings from Last 3 Encounters:  11/10/23 167 lb (75.8 kg)  06/09/23 175 lb (79.4 kg)  02/17/23 177 lb (80.3 kg)   Temp Readings from Last 3 Encounters:  11/10/23 97.6 F (36.4 C)  10/02/23 98.3 F (36.8 C) (Oral)  08/25/22 98.1 F (36.7 C) (Temporal)   BP Readings from Last 3 Encounters:  11/10/23 (!) 144/70  10/19/23 (!) 145/79  10/02/23 (!) 153/78   Pulse Readings from Last 3 Encounters:  11/10/23 90   10/19/23 79  10/02/23 (!) 108   Pain Assessment Pain Score: 0-No pain/10  In general this is a well appearing Caucasian male in no acute distress. He's alert and oriented x4 and appropriate throughout the examination. Cardiopulmonary assessment is negative for acute distress, and he exhibits normal effort.     KPS = 100  100 - Normal; no complaints; no evidence of disease. 90   - Able to carry on normal activity; minor signs or symptoms of disease. 80   - Normal activity with effort; some signs or symptoms of disease. 18   - Cares for self; unable to carry on normal activity or to do active work. 60   - Requires occasional assistance, but is able to care for most of his personal needs. 50   - Requires considerable assistance and frequent medical care. 40   - Disabled; requires special care and assistance. 30   - Severely disabled; hospital admission is indicated although death not imminent. 20   - Very sick; hospital admission necessary; active supportive treatment necessary. 10   - Moribund; fatal processes progressing rapidly. 0     - Dead  Karnofsky DA, Abelmann WH, Craver LS and [facility] [phone]) The use of the nitrogen mustards in the palliative treatment of carcinoma: with particular reference to bronchogenic carcinoma Cancer 1 634-56  LABORATORY DATA:  Lab Results  Component Value Date   WBC 5.0 10/02/2023   HGB 14.3 10/02/2023   HCT 42.2 10/02/2023   MCV 94.2 10/02/2023   PLT 131 (L) 10/02/2023   Lab Results  Component Value Date   NA 134 (L) 10/02/2023   K 4.1 10/02/2023   CL 97 (L) 10/02/2023   CO2 26 10/02/2023   Lab Results  Component Value Date   ALT 22 10/02/2023   AST 21 10/02/2023   ALKPHOS 78 10/02/2023   BILITOT 1.1 10/02/2023     RADIOGRAPHY: NM PET (PSMA) SKULL TO MID THIGH Result Date: 11/01/2023 CLINICAL DATA:  Intermediate risk prostate carcinoma.  Staging. EXAM: NUCLEAR MEDICINE PET SKULL BASE TO THIGH TECHNIQUE: 8.7 mCi Flotufolastat  (Posluma ) was injected intravenously. Full-ring PET imaging was performed from the skull base to thigh after the radiotracer. CT data was obtained  and used for attenuation correction and anatomic localization. COMPARISON:  None Available. FINDINGS: NECK No radiotracer activity in neck lymph nodes. Incidental CT finding: None. CHEST No radiotracer accumulation within mediastinal or hilar lymph nodes. No suspicious pulmonary nodules on the CT scan. Incidental CT finding: Mild centrilobular emphysema. ABDOMEN/PELVIS Prostate: Focal radiotracer accumulation is seen left mid prostate, with SUV max of 11.4. Lymph nodes: No abnormal radiotracer accumulation within pelvic or abdominal nodes. Liver: No evidence of liver metastasis. Incidental CT finding: Diverticulosis is seen mainly involving the descending and sigmoid colon, however there is no evidence of diverticulitis. SKELETON No focal activity to suggest skeletal metastasis. IMPRESSION: Focal radiotracer accumulation in left mid prostate, consistent with primary prostate carcinoma. No evidence of local or distant metastatic disease. Electronically Signed   By: Norleen DELENA Kil M.D.   On: 11/01/2023 17:34   CUP PACEART REMOTE DEVICE CHECK Result Date: 10/12/2023 ILR summary report received. Battery status OK. Normal device function. No new symptom, tachy, brady, or pause episodes. No new AF episodes. Monthly summary reports and ROV/PRN. [facility]     IMPRESSION/PLAN: Unfavorable Intermediate-Risk Prostate Adenocarcinoma (Gleason Grade Group 3, PSA 8.0, PIRADS 5 lesion, localized disease on PSMA PET):  78 year old male with biopsy-proven prostate adenocarcinoma, including Gleason 4+3=7 in 3/3 MRI-targeted cores, Gleason 3+4=7 in 1/12 standard cores, and Gleason 3+3=6 in 2/12 standard cores.  PSA 8.0 and PIRADS 5 lesion in the left anterior transition zone on MRI.  PSMA PET demonstrates focal uptake limited to the prostate with no evidence of nodal or distant  metastatic disease.  Based on NCCN criteria, this is classified as unfavorable intermediate-risk prostate cancer.  After a thorough discussion of treatment options--including active surveillance, IMRT with or without ADT, radical prostatectomy, and brachytherapy--the patient elects to proceed with definitive low dose rate (LDR) brachytherapy using I-125 seeds. He is a medically appropriate candidate with a prostate volume of 23 cc and no contraindications.  Plan:  He is interested in proceeding with brachytherapy and use of SpaceOAR gel to reduce rectal toxicity from radiotherapy.  No androgen deprivation therapy planned at this time. We will share our discussion with Dr. Sherrilee and move forward with scheduling his [facility] planning appointment in the near future.  The patient met briefly with Orlean Gunner in our office who will be working closely with him to coordinate OR scheduling and pre and post procedure appointments.  We will contact the pharmaceutical rep to ensure that SpaceOAR is available at the time of procedure.  We enjoyed meeting him today and look forward to continuing to participate in his care. We will coordinate with urology and primary care for ongoing comorbidity management.  We personally spent 70 minutes in this encounter including chart review, reviewing radiological studies, meeting face-to-face with the patient, entering orders and completing documentation.   Sabra MICAEL Rusk, PA-C    Donnice Barge, MD  [facility]  Radiation Oncology Direct Dial: [phone]  Fax: [phone] [email or web address]  Skype  LinkedIn

## 2023-11-11 ENCOUNTER — Telehealth: Payer: Self-pay | Admitting: *Deleted

## 2023-11-11 DIAGNOSIS — C61 Malignant neoplasm of prostate: Secondary | ICD-10-CM | POA: Insufficient documentation

## 2023-11-11 NOTE — Telephone Encounter (Signed)
 CALLED PATIENT TO INFORM OF PRE-SEED APPTS. AND IMPLANT DATE, SPOKE WITH PATIENT AND HE IS AWARE OF THESE APPTS.

## 2023-11-12 ENCOUNTER — Ambulatory Visit

## 2023-11-12 DIAGNOSIS — I480 Paroxysmal atrial fibrillation: Secondary | ICD-10-CM

## 2023-11-12 LAB — CUP PACEART REMOTE DEVICE CHECK
Date Time Interrogation Session: 20250806230148
Implantable Pulse Generator Implant Date: 20200918

## 2023-11-16 DIAGNOSIS — N3000 Acute cystitis without hematuria: Secondary | ICD-10-CM | POA: Diagnosis not present

## 2023-11-17 ENCOUNTER — Ambulatory Visit: Payer: Self-pay | Admitting: Cardiovascular Disease

## 2023-11-24 ENCOUNTER — Other Ambulatory Visit: Payer: Self-pay | Admitting: Cardiovascular Disease

## 2023-11-24 ENCOUNTER — Other Ambulatory Visit: Payer: Self-pay | Admitting: Urology

## 2023-11-24 DIAGNOSIS — C61 Malignant neoplasm of prostate: Secondary | ICD-10-CM

## 2023-12-03 ENCOUNTER — Other Ambulatory Visit: Payer: Self-pay | Admitting: Cardiovascular Disease

## 2023-12-08 DIAGNOSIS — G43B0 Ophthalmoplegic migraine, not intractable: Secondary | ICD-10-CM | POA: Diagnosis not present

## 2023-12-08 DIAGNOSIS — H31092 Other chorioretinal scars, left eye: Secondary | ICD-10-CM | POA: Diagnosis not present

## 2023-12-08 DIAGNOSIS — H43393 Other vitreous opacities, bilateral: Secondary | ICD-10-CM | POA: Diagnosis not present

## 2023-12-08 DIAGNOSIS — H2513 Age-related nuclear cataract, bilateral: Secondary | ICD-10-CM | POA: Diagnosis not present

## 2023-12-14 ENCOUNTER — Ambulatory Visit

## 2023-12-14 DIAGNOSIS — I480 Paroxysmal atrial fibrillation: Secondary | ICD-10-CM | POA: Diagnosis not present

## 2023-12-14 LAB — CUP PACEART REMOTE DEVICE CHECK
Date Time Interrogation Session: 20250906230636
Implantable Pulse Generator Implant Date: 20200918

## 2023-12-23 ENCOUNTER — Telehealth: Payer: Self-pay | Admitting: Urology

## 2023-12-23 ENCOUNTER — Other Ambulatory Visit: Payer: Self-pay

## 2023-12-23 ENCOUNTER — Other Ambulatory Visit

## 2023-12-23 ENCOUNTER — Ambulatory Visit

## 2023-12-23 DIAGNOSIS — N390 Urinary tract infection, site not specified: Secondary | ICD-10-CM

## 2023-12-23 LAB — URINALYSIS, ROUTINE W REFLEX MICROSCOPIC
Bilirubin, UA: NEGATIVE
Glucose, UA: NEGATIVE
Ketones, UA: NEGATIVE
Nitrite, UA: NEGATIVE
Specific Gravity, UA: 1.025 (ref 1.005–1.030)
Urobilinogen, Ur: 0.2 mg/dL (ref 0.2–1.0)
pH, UA: 6 (ref 5.0–7.5)

## 2023-12-23 LAB — MICROSCOPIC EXAMINATION
RBC, Urine: >30 /HPF — AB (ref 0–2)
WBC, UA: >30 /HPF — AB (ref 0–5)

## 2023-12-23 MED ORDER — NITROFURANTOIN MONOHYD MACRO 100 MG PO CAPS: 100.0000 mg | ORAL_CAPSULE | Freq: Two times a day (BID) | ORAL | 0 refills | Status: DC

## 2023-12-23 NOTE — Telephone Encounter (Signed)
 Has been seeing [facility] and has had infection for 5 weeks. He has a procedure coming up in November and he wants to get McKenzie involved with the infection.  Dysuria  Patient called with c/o dysuria x 5 week days.  Pain: burning  Severity:6/10  Associated Signs and Symptoms:  Fever: noTemp. Chills: no Hematuria: no Urgency: yes Frequency: yes Hesitancy:no Incontinence: no Nausea: no Vomiting: no  Message sent to clinic staff to return call to patient to advise of plan.

## 2023-12-23 NOTE — Telephone Encounter (Signed)
 Patient presents today with complaints of  UTI.  UA and Culture done today.  Dr. Sherrilee reviewed results and Macrobid  100 mg BID X 7 days .  Patient aware of MD recommendations and that we will reach out with culture results.      Dyjwpvlj, CMA

## 2023-12-23 NOTE — Telephone Encounter (Signed)
 Urologic History:  Any Recent Urologic Surgeries or Procedures:yes-  Prostate biopsy at [facility]  Recurrent UTI's:yes Cystitis: no  Prostatitis:no Kidney or Bladder Stones: no Plan: Walk-in Clinic: no Appointment w/Physician: [no Lab visit scheduled for urine drop off: Yes Advice given:  Do you take on daily medications for UTI suppression No

## 2023-12-24 ENCOUNTER — Ambulatory Visit: Payer: Self-pay | Admitting: Cardiovascular Disease

## 2023-12-24 NOTE — Progress Notes (Signed)
 Remote Loop Recorder Transmission

## 2023-12-26 LAB — URINE CULTURE

## 2023-12-28 ENCOUNTER — Ambulatory Visit: Payer: Self-pay

## 2023-12-30 NOTE — Progress Notes (Signed)
 KOSTAS MARROW                                          MRN: [id number]   12/30/2023   The [facility] Quality Team Specialist reviewed this patient medical record for the purposes of chart review for care gap closure. The following were reviewed: chart review for care gap closure-controlling blood pressure.    VBCI Quality Team

## 2024-01-01 ENCOUNTER — Telehealth: Payer: Self-pay | Admitting: Urology

## 2024-01-01 DIAGNOSIS — N390 Urinary tract infection, site not specified: Secondary | ICD-10-CM

## 2024-01-01 NOTE — Telephone Encounter (Signed)
 Patient called into the office today with general questions/concerns regarding finished antibiotics Wednesday morning and he is having symptoms again. Would like to talk to a nurse. Patient may be reached at [phone] to discuss questions.

## 2024-01-01 NOTE — Telephone Encounter (Signed)
 Pt state's he is having burning again and be on and off abt for about 5 weeks. Pt state's he feel better but he still burning and want to know if this normal. Pt is made aware a message will be sent to DR. McKenzie on Music therapist. Verbalized understanding.

## 2024-01-02 NOTE — Progress Notes (Signed)
 Remote Loop Recorder Transmission

## 2024-01-04 NOTE — Telephone Encounter (Signed)
 Pt called in and state's he is stilling having UTI symptoms and he has completed the Macrobid  on 09/24. Patient would like to come in for another urine drop if needed. Pt is aware a message will be sent to dr. Sherrilee. Verbalized understanding

## 2024-01-05 MED ORDER — DOXYCYCLINE HYCLATE 100 MG PO CAPS: 100.0000 mg | ORAL_CAPSULE | Freq: Two times a day (BID) | ORAL | 0 refills | Status: DC

## 2024-01-05 NOTE — Telephone Encounter (Signed)
 Pt is made aware and verbalized understanding Doxycyclioen 100mg  BID for 28 days

## 2024-01-06 ENCOUNTER — Ambulatory Visit: Admitting: Urology

## 2024-01-07 NOTE — Progress Notes (Signed)
 RN left message for call back to assess any questions or barriers prior to upcoming brachytherapy procedure.

## 2024-01-08 ENCOUNTER — Other Ambulatory Visit

## 2024-01-12 NOTE — Progress Notes (Signed)
 RN returned patients call to review any questions about upcoming brachytherapy.  RN provided education on procedure, and followed up with written information as well.  Plan of care in progress, will continue to follow.

## 2024-01-13 ENCOUNTER — Ambulatory Visit: Admitting: Urology

## 2024-01-14 ENCOUNTER — Encounter

## 2024-01-14 ENCOUNTER — Ambulatory Visit

## 2024-01-14 DIAGNOSIS — I480 Paroxysmal atrial fibrillation: Secondary | ICD-10-CM | POA: Diagnosis not present

## 2024-01-15 LAB — CUP PACEART REMOTE DEVICE CHECK
Date Time Interrogation Session: 20251008230355
Implantable Pulse Generator Implant Date: 20200918

## 2024-01-15 NOTE — Progress Notes (Signed)
 Remote Loop Recorder Transmission

## 2024-01-18 DIAGNOSIS — C61 Malignant neoplasm of prostate: Secondary | ICD-10-CM | POA: Diagnosis not present

## 2024-01-18 DIAGNOSIS — Z191 Hormone sensitive malignancy status: Secondary | ICD-10-CM | POA: Diagnosis not present

## 2024-01-18 NOTE — Progress Notes (Signed)
  Radiation Oncology         [phone] ________________________________  Name: Luis [name] MRN: [id number]  Date: 01/21/2024  DOB: 08-12-1945  SIMULATION AND TREATMENT PLANNING NOTE PUBIC ARCH STUDY Definitive Seed Implant as Monotherapy  RR:Dxjxoz, Massie, DO  McKenzie, Belvie CROME, MD  DIAGNOSIS:  Oncology History  Malignant neoplasm of prostate (HCC)  09/30/2023 Cancer Staging   Staging form: Prostate, AJCC 8th Edition - Clinical stage from 09/30/2023: Stage IIC (cT1c, cN0, cM0, PSA: 8, Grade Group: 3) - Signed by Sherwood Rise, PA-C on 11/11/2023 Histopathologic type: Adenocarcinoma, NOS Stage prefix: Initial diagnosis Prostate specific antigen (PSA) range: Less than 10 Gleason primary pattern: 4 Gleason secondary pattern: 3 Gleason score: 7 Histologic grading system: 5 grade system Number of biopsy cores examined: 15 Number of biopsy cores positive: 6 Location of positive needle core biopsies: Both sides   11/11/2023 Initial Diagnosis   Malignant neoplasm of prostate (HCC)       ICD-10-CM   1. Malignant neoplasm of prostate (HCC)  C61       COMPLEX SIMULATION:  The patient presented today for evaluation for possible prostate seed implant. He was brought to the radiation planning suite and placed supine on the CT couch. A 3-dimensional image study set was obtained in upload to the planning computer. There, on each axial slice, I contoured the prostate gland. Then, using three-dimensional radiation planning tools I reconstructed the prostate in view of the structures from the transperineal needle pathway to assess for possible pubic arch interference. In doing so, I did not appreciate any pubic arch interference. Also, the patient's prostate volume was estimated based on the drawn structure. The volume was 29 cc.  Given the pubic arch appearance and prostate volume, patient remains a good candidate to proceed with prostate seed implant. Today, he freely provided informed  written consent to proceed.    PLAN: The patient will undergo prostate seed implant to 145 Gy.   ________________________________  Donnice LABOR. Patrcia, M.D.

## 2024-01-18 NOTE — Progress Notes (Signed)
 Remote Loop Recorder Transmission

## 2024-01-20 ENCOUNTER — Telehealth: Payer: Self-pay | Admitting: *Deleted

## 2024-01-20 NOTE — Telephone Encounter (Signed)
 CALLED PATIENT TO REMIND OF  PRE-SEED APPTS. FOR 01-21-24, SPOKE WITH PATIENT AND HE IS AWARE OF THESE APPTS.

## 2024-01-20 NOTE — Progress Notes (Signed)
 Pre-seed nursing interview for a diagnosis of 78 y.o. gentleman with Stage T1c adenocarcinoma of the prostate with Gleason score of 4+3, and PSA of 8.0.   Patient identity verified x2.   Patient states issues as follows...  -Pain: *** -Fatigue: *** -Abdomen: *** -Groin: *** -Urinary: *** -Bowels: *** -Appetite: *** -Weight  Patient denies all other related issues at this time.  Meaningful use complete.  Urinary Management medication(s)- *** Urology appointment date- ***, with Dr. Belvie Clara at ***  No vitals needed for this visit.  This concludes the interaction.  NURSE REMINDER: START 'PRE-SEED' EDUCATION VIDEO AT 4:25 mins.

## 2024-01-21 ENCOUNTER — Ambulatory Visit: Payer: Self-pay | Admitting: Cardiovascular Disease

## 2024-01-21 ENCOUNTER — Ambulatory Visit
Admission: RE | Admit: 2024-01-21 | Discharge: 2024-01-21 | Disposition: A | Discharge status: Home / Self Care | Source: Ambulatory Visit | Attending: Radiation Oncology | Admitting: Radiation Oncology

## 2024-01-21 ENCOUNTER — Ambulatory Visit
Admission: RE | Admit: 2024-01-21 | Discharge: 2024-01-21 | Disposition: A | Discharge status: Home / Self Care | Payer: Self-pay | Source: Ambulatory Visit | Attending: Urology | Admitting: Urology

## 2024-01-21 ENCOUNTER — Encounter: Payer: Self-pay | Admitting: Urology

## 2024-01-21 DIAGNOSIS — C61 Malignant neoplasm of prostate: Secondary | ICD-10-CM

## 2024-01-21 DIAGNOSIS — Z191 Hormone sensitive malignancy status: Secondary | ICD-10-CM | POA: Diagnosis not present

## 2024-01-21 HISTORY — DX: Malignant neoplasm of prostate: C61

## 2024-01-21 NOTE — Progress Notes (Signed)
 Radiation Oncology         [phone] ________________________________  Outpatient Follow up- Pre-seed visit  Name: Luis [name] MRN: [id number]  Date: 01/21/2024  DOB: Aug 05, 1945  RR:Dxjxoz, Massie, DO  McKenzie, Belvie CROME, MD   REFERRING PHYSICIAN: Sherrilee Belvie CROME, MD  DIAGNOSIS: 78 y.o. gentleman with Stage T1c adenocarcinoma of the prostate with Gleason score of 4+3, and PSA of 8.0.     ICD-10-CM   1. Malignant neoplasm of prostate (HCC)  C61       HISTORY OF PRESENT ILLNESS: Luis [name] is a 78 y.o. male with a diagnosis of prostate cancer. His PCP noted an elevated PSA of 8.0 on routine labs, prompting further evaluation with Dr. Sherrilee in urology. An IsoPSA was elevated at 25.9 and PSA was stable elevated at 8.2. MRI of the prostate on 08/11/23 revealed a PI-RADS 5 lesion in the left anterior transition zone. MRI/Ultrasound fusion-guided biopsy on 09/30/23 showed Gleason 4+3=7 (Grade Group 3) in 3 of 3 targeted cores, 3+4=7 in 1 of 12 systematic core/s, and 3+3=6 in 2 additional cores. Prostate volume was 30.6 cc. Post-biopsy, he experienced acute prostatitis treated with Keflex , followed by epididymo-orchitis requiring a 14-day course of Bactrim . Staging PSMA PET/CT 10/29/23 demonstrated focal radiotracer uptake in the left mid prostate (SUV max 11.4) consistent with known disease and no evidence of nodal or distant metastasis. He has elected to pursue definitive radiation therapy over surgical or focal options and presents today to discuss treatment planning.   The patient reviewed the biopsy results with his urologist and was kindly referred to us  for discussion of potential radiation treatment options. We initially met the patient on 11/10/23 and he was most interested in proceeding with brachytherapy and SpaceOAR gel placement for treatment of his disease. He is here today for his pre-procedure imaging for planning and to answer any additional questions he may have about  this treatment. He has struggled with recurrent UTI since the time of his biopsy and is currently on a 28 day course of culture directed doxycycline for E. Coli UTI. He will complete the course of treatment on 02/02/24.   PREVIOUS RADIATION THERAPY: No  PAST MEDICAL HISTORY:  Past Medical History:  Diagnosis Date   Allergy    SEASONAL   Arthritis    BACK AND KNEES   Complication of anesthesia    pt states aspirated following ablation    Coronary artery disease    Dysrhythmia    Elevated PSA    GERD (gastroesophageal reflux disease)    History of kidney stones 07/2006   x 1   Hyperlipidemia    Hypertension    Normal coronary arteries 2008   Normal LV function   Paroxysmal atrial fibrillation (HCC)    2008 and 79985, PVI 09/07/12   Prostate cancer [facility])       PAST SURGICAL HISTORY: Past Surgical History:  Procedure Laterality Date   ATRIAL ABLATION SURGERY  09/07/2012   PVI by Dr Kelsie   ATRIAL FIBRILLATION ABLATION N/A 09/07/2012   Procedure: ATRIAL FIBRILLATION ABLATION;  Surgeon: Lynwood Kelsie, MD;  Location: [facility] CATH LAB;  Service: Cardiovascular;  Laterality: N/A;   CARDIAC CATHETERIZATION Left 07/20/2006   No significant CAD, EF 60%, continue medical therapy   COLONOSCOPY  05/13/2021   CYSTOSCOPY W/ RETROGRADES Left 02/28/2019   Procedure: CYSTOSCOPY WITH RETROGRADE PYELOGRAM;  Surgeon: Elisabeth Valli BIRCH, MD;  Location: [facility] ORS;  Service: Urology;  Laterality: Left;   CYSTOSCOPY/URETEROSCOPY/HOLMIUM LASER/STENT PLACEMENT Left  03/08/2019   Procedure: CYSTOSCOPY/URETEROSCOPY/HOLMIUM LASER/STENT PLACEMENT;  Surgeon: Elisabeth Valli BIRCH, MD;  Location: [facility];  Service: Urology;  Laterality: Left;  1 HR   EP IMPLANTABLE DEVICE N/A 01/23/2015   Procedure: Loop Recorder Insertion;  Surgeon: Lynwood Rakers, MD;  Location: [facility] INVASIVE CV LAB;  Service: Cardiovascular;  Laterality: N/A;   implantable loop recorder placement  12/24/2018   previous ILR removed and a  new MDT Reveal LINQ2 implanted in its place in office by Dr Rakers   KNEE ARTHROSCOPY Right    PROSTATE BIOPSY     TEE WITHOUT CARDIOVERSION N/A 09/06/2012   Procedure: TRANSESOPHAGEAL ECHOCARDIOGRAM (TEE);  Surgeon: Vinie KYM Maxcy, MD;  Location: [facility] ENDOSCOPY;  Service: Cardiovascular;  Laterality: N/A;   TOTAL KNEE ARTHROPLASTY Right 12/04/2020   Procedure: TOTAL KNEE ARTHROPLASTY;  Surgeon: Ernie Cough, MD;  Location: [facility] ORS;  Service: Orthopedics;  Laterality: Right;   UPPER GASTROINTESTINAL ENDOSCOPY      FAMILY HISTORY:  Family History  Problem Relation Age of Onset   Liver cancer Mother    CAD Father    Heart Problems Father    Stroke Father    Hypertension Father    Heart Problems Brother 28       Had stent put in   Stomach cancer Neg Hx    Prostate cancer Neg Hx    Colon cancer Neg Hx     SOCIAL HISTORY:  Social History   Socioeconomic History   Marital status: Married    Spouse name: Not on file   Number of children: 4   Years of education: Not on file   Highest education level: Not on file  Occupational History   Occupation: retired  Tobacco Use   Smoking status: Former    Current packs/day: 0.00    Types: Cigarettes    Start date: 11/27/1959    Quit date: 11/27/1994    Years since quitting: 29.1   Smokeless tobacco: Never  Vaping Use   Vaping status: Never Used  Substance and Sexual Activity   Alcohol  use: Yes    Alcohol /week: 7.0 standard drinks of alcohol     Types: 7 Standard drinks or equivalent per week    Comment: 3-4 oz daily 1/2 drinks per night   Drug use: No   Sexual activity: Not on file  Other Topics Concern   Not on file  Social History Narrative   Lives in [place] with spouse.   Retired Visual merchandiser   Social Drivers of Corporate investment banker Strain: Not on file  Food Insecurity: No Food Insecurity (11/10/2023)   Hunger Vital Sign    Worried About Running Out of Food in the Last Year: Never true    Ran Out of  Food in the Last Year: Never true  Transportation Needs: No Transportation Needs (11/10/2023)   PRAPARE - Administrator, Civil Service (Medical): No    Lack of Transportation (Non-Medical): No  Physical Activity: Not on file  Stress: Not on file  Social Connections: Not on file  Intimate Partner Violence: Not At Risk (11/10/2023)   Humiliation, Afraid, Rape, and Kick questionnaire    Fear of Current or Ex-Partner: No    Emotionally Abused: No    Physically Abused: No    Sexually Abused: No    ALLERGIES: Ciprofloxacin   MEDICATIONS:  Current Outpatient Medications  Medication Sig Dispense Refill   aspirin  EC 81 MG tablet Take 1 tablet (81 mg total) by mouth in  the morning. Resume once daily aspirin  after completing 28 days of twice daily dosing. Swallow whole. 30 tablet 11   celecoxib  (CELEBREX ) 200 MG capsule Take 200 mg by mouth daily.     doxycycline (VIBRAMYCIN) 100 MG capsule Take 1 capsule (100 mg total) by mouth 2 (two) times daily. 56 capsule 0   metoprolol  succinate (TOPROL -XL) 50 MG 24 hr tablet TAKE 1 TABLET BY MOUTH DAILY TAKE  WITH  OR  IMMEDIATELY  FOLLOWING  A  MEAL 90 tablet 2   omeprazole  (PRILOSEC) 20 MG capsule Take 20 mg by mouth in the morning.     rosuvastatin  (CRESTOR ) 10 MG tablet Take 1 tablet by mouth once daily 90 tablet 1   No current facility-administered medications for this encounter.    REVIEW OF SYSTEMS:  On review of systems, the patient reports that he is doing well overall. He denies any chest pain, shortness of breath, cough, fevers, chills, night sweats, unintended weight changes. He denies any bowel disturbances, and denies abdominal pain, nausea or vomiting. He denies any new musculoskeletal or joint aches or pains. His IPSS was Total Score: 9, indicating moderate urinary symptoms (Reference 0-7 mild, 8-19 moderate, 20-35 severe).  His SHIM: 16, indicating he has moderate erectile dysfunction (Reference - 22-25 None, 17-21 Mild, 8-16  Moderate, 1-7 Severe). A complete review of systems is obtained and is otherwise negative.     PHYSICAL EXAM:  Wt Readings from Last 3 Encounters:  11/10/23 167 lb (75.8 kg)  06/09/23 175 lb (79.4 kg)  02/17/23 177 lb (80.3 kg)   Temp Readings from Last 3 Encounters:  11/10/23 97.6 F (36.4 C)  10/02/23 98.3 F (36.8 C) (Oral)  08/25/22 98.1 F (36.7 C) (Temporal)   BP Readings from Last 3 Encounters:  11/10/23 (!) 144/70  10/19/23 (!) 145/79  10/02/23 (!) 153/78   Pulse Readings from Last 3 Encounters:  11/10/23 90  10/19/23 79  10/02/23 (!) 108    /10  In general this is a well appearing Caucasian male in no acute distress. He's alert and oriented x4 and appropriate throughout the examination. Cardiopulmonary assessment is negative for acute distress, and he exhibits normal effort.     KPS = 100  100 - Normal; no complaints; no evidence of disease. 90   - Able to carry on normal activity; minor signs or symptoms of disease. 80   - Normal activity with effort; some signs or symptoms of disease. 101   - Cares for self; unable to carry on normal activity or to do active work. 60   - Requires occasional assistance, but is able to care for most of his personal needs. 50   - Requires considerable assistance and frequent medical care. 40   - Disabled; requires special care and assistance. 30   - Severely disabled; hospital admission is indicated although death not imminent. 20   - Very sick; hospital admission necessary; active supportive treatment necessary. 10   - Moribund; fatal processes progressing rapidly. 0     - Dead  Karnofsky DA, Abelmann WH, Craver LS and Burchenal [facility] [phone]) The use of the nitrogen mustards in the palliative treatment of carcinoma: with particular reference to bronchogenic carcinoma Cancer 1 634-56  LABORATORY DATA:  Lab Results  Component Value Date   WBC 5.0 10/02/2023   HGB 14.3 10/02/2023   HCT 42.2 10/02/2023   MCV 94.2 10/02/2023    PLT 131 (L) 10/02/2023   Lab Results  Component Value Date  NA 134 (L) 10/02/2023   K 4.1 10/02/2023   CL 97 (L) 10/02/2023   CO2 26 10/02/2023   Lab Results  Component Value Date   ALT 22 10/02/2023   AST 21 10/02/2023   ALKPHOS 78 10/02/2023   BILITOT 1.1 10/02/2023     RADIOGRAPHY: CUP PACEART REMOTE DEVICE CHECK Result Date: 01/15/2024 ILR summary report received. Battery status OK. Normal device function. No new symptom, tachy, brady, or pause episodes. No new AF episodes. Monthly summary reports and ROV/PRN.  PVC's=0.1% LA, CVRS     IMPRESSION/PLAN: 1. 78 y.o. gentleman with Stage T1c adenocarcinoma of the prostate with Gleason score of 4+3, and PSA of 8.0.  The patient has elected to proceed with seed implant for treatment of his disease. We reviewed the risks, benefits, short and long-term effects associated with brachytherapy and discussed the role of SpaceOAR in reducing the rectal toxicity associated with radiotherapy.  He appears to have a good understanding of his disease and our treatment recommendations which are of curative intent.  He was encouraged to ask questions that were answered to his stated satisfaction. He has freely signed written consent to proceed today in the office and a copy of this document will be placed in his medical record. His procedure is tentatively scheduled for 02/18/2024 in collaboration with Dr. Sherrilee and we will see him back for his post-procedure visit approximately 3 weeks thereafter. We look forward to continuing to participate in his care. He knows that he is welcome to call with any questions or concerns at any time in the interim.  He is currently on a 28 day course of culture directed doxycycline for E. Coli UTI. He will complete the course of treatment on 02/02/24. We will share our recommendations with Dr. Sherrilee for a post-treatment u/a and start of prophylactic antibiotics through to day of procedure.  I personally spent 30  minutes in this encounter including chart review, reviewing radiological studies, meeting face-to-face with the patient, entering orders and completing documentation.    Sabra MICAEL Rusk, MMS, PA-C [place]  [facility] Radiation Oncology Physician Assistant Direct Dial: [phone]  Fax: [phone]

## 2024-01-27 ENCOUNTER — Telehealth: Payer: Self-pay

## 2024-01-27 MED ORDER — SULFAMETHOXAZOLE-TRIMETHOPRIM 800-160 MG PO TABS: 1.0000 | ORAL_TABLET | Freq: Every day | ORAL | 0 refills | Status: DC

## 2024-01-27 NOTE — Telephone Encounter (Signed)
 Patient called and made aware to take Bactrim  1 tablet a day until his surgery per Dr. Sherrilee.

## 2024-01-27 NOTE — Telephone Encounter (Signed)
-----   Message from Belvie Clara sent at 01/26/2024  9:11 AM EDT ----- Regarding: RE: Follow up Have him take bactrim  DS 1 tab daily ----- Message ----- From: Malachy Slice, LPN Sent: [id number]/7974   7:43 AM EDT To: Belvie LITTIE Clara, MD; Almarie Pont, R# Subject: FW: Follow up                                  Dr. Clara please see comment concerning prophylactic antibiotic. Kourtney please see comment concerning post op. ----- Message ----- From: Pont Almarie, RN Sent: 01/25/2024   1:35 PM EDT To: Slice Malachy, LPN; Carlos JONELLE Gaskins, CMA Subject: Follow up                                      Hey ladies,   Was wondering if you could help me out with this gentleman.   We saw him on 10/16 for pre-seed visit and he informed us  that he has struggled with recurrent UTI since the time of biopsy and is currently on a 28 day course of culture directed doxycycline for E. Coli UTI. He will complete the course of treatment on 02/02/24.  Dr. Patrcia recommended a post-treatment u/a with Dr. Clara and start of prophylactic antibiotics through to day of procedure. Also, he was very concerned about the fact that he is not scheduled for follow up with Dr. Clara for 2 weeks post-op. Is there any way that he has a nurse visit in the urology office 3-5 days post-op for voiding trial so that he is not at increased risk for UTI with prolonged indwelling foley catheter?  Thanks so much for your help!  Let me know if I can do anything to help with him.   [place]ell

## 2024-02-01 DIAGNOSIS — M1712 Unilateral primary osteoarthritis, left knee: Secondary | ICD-10-CM | POA: Diagnosis not present

## 2024-02-09 NOTE — Patient Instructions (Signed)
 SURGICAL WAITING ROOM VISITATION  Patients having surgery or a procedure may have no more than 2 support people in the waiting area - these visitors may rotate.    Children under the age of 74 must have an adult with them who is not the patient.  Visitors with respiratory illnesses are discouraged from visiting and should remain at home.  If the patient needs to stay at the hospital during part of their recovery, the visitor guidelines for inpatient rooms apply. Pre-op nurse will coordinate an appropriate time for 1 support person to accompany patient in pre-op.  This support person may not rotate.    Please refer to the [facility] website for the visitor guidelines for Inpatients (after your surgery is over and you are in a regular room).    Your procedure is scheduled on: 02/18/24   Report to [facility] Main Entrance    Report to admitting at 11:00 AM   Call this number if you have problems the morning of surgery [phone]   Do not eat food or drink liquids :After Midnight.          If you have questions, please contact your surgeon's office.   FOLLOW BOWEL PREP AND ANY ADDITIONAL PRE OP INSTRUCTIONS YOU RECEIVED FROM YOUR SURGEON'S OFFICE!!!     Oral Hygiene is also important to reduce your risk of infection.                                    Remember - BRUSH YOUR TEETH THE MORNING OF SURGERY WITH YOUR REGULAR TOOTHPASTE  DENTURES WILL BE REMOVED PRIOR TO SURGERY PLEASE DO NOT APPLY Poly grip OR ADHESIVES!!!   Stop all vitamins and herbal supplements 7 days before surgery. .  Hold Aspirin  and Celebrex  5 days.   Take these medicines the morning of surgery with A SIP OF WATER : Omeprazole , Flonase                              You may not have any metal on your body including jewelry, and body piercing             Do not wear lotions, powders, cologne, or deodorant              Men may shave face and neck.   Do not bring valuables to the hospital. [facility] IS NOT             RESPONSIBLE   FOR VALUABLES.   Contacts, glasses, dentures or bridgework may not be worn into surgery.  DO NOT BRING YOUR HOME MEDICATIONS TO THE HOSPITAL. PHARMACY WILL DISPENSE MEDICATIONS LISTED ON YOUR MEDICATION LIST TO YOU DURING YOUR ADMISSION IN THE HOSPITAL!    Patients discharged on the day of surgery will not be allowed to drive home.  Someone NEEDS to stay with you for the first 24 hours after anesthesia.              Please read over the following fact sheets you were given: IF YOU HAVE QUESTIONS ABOUT YOUR PRE-OP INSTRUCTIONS PLEASE CALL [phone] [place].   If you received a COVID test during your pre-op visit  it is requested that you wear a mask when out in public, stay away from anyone that may not be feeling well and notify your surgeon if you develop symptoms. If you  test positive for Covid or have been in contact with anyone that has tested positive in the last 10 days please notify you surgeon.    [facility] - Preparing for Surgery Before surgery, you can play an important role.  Because skin is not sterile, your skin needs to be as free of germs as possible.  You can reduce the number of germs on your skin by washing with CHG (chlorahexidine gluconate) soap before surgery.  CHG is an antiseptic cleaner which kills germs and bonds with the skin to continue killing germs even after washing. Please DO NOT use if you have an allergy to CHG or antibacterial soaps.  If your skin becomes reddened/irritated stop using the CHG and inform your nurse when you arrive at Short Stay. Do not shave (including legs and underarms) for at least 48 hours prior to the first CHG shower.  You may shave your face/neck.  Please follow these instructions carefully:  1.  Shower with CHG Soap the night before surgery ONLY (DO NOT USE THE SOAP THE MORNING OF SURGERY).  2.  If you choose to wash your hair, wash your hair first as usual with your normal  shampoo.  3.   After you shampoo, rinse your hair and body thoroughly to remove the shampoo.                             4.  Use CHG as you would any other liquid soap.  You can apply chg directly to the skin and wash.  Gently with a scrungie or clean washcloth.  5.  Apply the CHG Soap to your body ONLY FROM THE NECK DOWN.   Do   not use on face/ open                           Wound or open sores. Avoid contact with eyes, ears mouth and   genitals (private parts).                       Wash face,  Genitals (private parts) with your normal soap.             6.  Wash thoroughly, paying special attention to the area where your    surgery  will be performed.  7.  Thoroughly rinse your body with warm water  from the neck down.  8.  DO NOT shower/wash with your normal soap after using and rinsing off the CHG Soap.                9.  Pat yourself dry with a clean towel.            10.  Wear clean pajamas.            11.  Place clean sheets on your bed the night of your first shower and do not  sleep with pets. Day of Surgery : Do not apply any CHG, lotions/deodorants the morning of surgery.  Please wear clean clothes to the hospital/surgery center.  FAILURE TO FOLLOW THESE INSTRUCTIONS MAY RESULT IN THE CANCELLATION OF YOUR SURGERY  PATIENT SIGNATURE_________________________________  NURSE SIGNATURE__________________________________  ________________________________________________________________________

## 2024-02-09 NOTE — Progress Notes (Addendum)
 COVID Vaccine Completed: yes  Date of COVID positive in last 90 days:  PCP - Massie Valentin HAS- most recent labs in media tab dated 02/18/24 Cardiologist - Dorn Lesches, MD LOV 06/09/23 Electrophysiologist- Eulas Nancey COME  PET- 10/29/23 Epic Chest x-ray - N/A EKG - 06/09/23 Epic Stress Test - long time ago per pt ECHO - 2014 Cardiac Cath - 2008 Ablation- 2014 Loop recorder- last checked 01/13/24 Epic Pacemaker/ICD device last checked:N/A Spinal Cord Stimulator:N/A  Bowel Prep - N/A  Sleep Study - N/A CPAP -   Fasting Blood Sugar - N/A Checks Blood Sugar _____ times a day  Last dose of GLP1 agonist-  N/A GLP1 instructions:  Do not take after     Last dose of SGLT-2 inhibitors-  N/A SGLT-2 instructions:  Do not take after     Blood Thinner Instructions: N/A Last dose:   Time: Aspirin  Instructions: ASA 81, hold 5 days Last Dose:  Activity level: Can go up a flight of stairs and perform activities of daily living without stopping and without symptoms of chest pain or shortness of breath.  Anesthesia review: HTN, a fib, CAD, loop recorder  Patient denies shortness of breath, fever, cough and chest pain at PAT appointment  Patient verbalized understanding of instructions that were given to them at the PAT appointment. Patient was also instructed that they will need to review over the PAT instructions again at home before surgery.

## 2024-02-10 ENCOUNTER — Encounter (HOSPITAL_COMMUNITY)
Admission: RE | Admit: 2024-02-10 | Discharge: 2024-02-10 | Disposition: A | Discharge status: Home / Self Care | Source: Ambulatory Visit | Attending: Urology | Admitting: Urology

## 2024-02-10 ENCOUNTER — Encounter (HOSPITAL_COMMUNITY): Payer: Self-pay

## 2024-02-10 ENCOUNTER — Other Ambulatory Visit: Payer: Self-pay

## 2024-02-10 VITALS — BP 115/73 | HR 69 | Temp 98.2°F | Resp 14 | Ht 71.0 in | Wt 168.0 lb

## 2024-02-10 DIAGNOSIS — Z87891 Personal history of nicotine dependence: Secondary | ICD-10-CM | POA: Insufficient documentation

## 2024-02-10 DIAGNOSIS — I2510 Atherosclerotic heart disease of native coronary artery without angina pectoris: Secondary | ICD-10-CM | POA: Diagnosis not present

## 2024-02-10 DIAGNOSIS — Z01812 Encounter for preprocedural laboratory examination: Secondary | ICD-10-CM | POA: Insufficient documentation

## 2024-02-10 DIAGNOSIS — C61 Malignant neoplasm of prostate: Secondary | ICD-10-CM | POA: Insufficient documentation

## 2024-02-10 DIAGNOSIS — I480 Paroxysmal atrial fibrillation: Secondary | ICD-10-CM | POA: Diagnosis not present

## 2024-02-10 DIAGNOSIS — I10 Essential (primary) hypertension: Secondary | ICD-10-CM | POA: Diagnosis not present

## 2024-02-10 HISTORY — DX: Migraine with aura, not intractable, without status migrainosus: G43.109

## 2024-02-10 LAB — BASIC METABOLIC PANEL WITH GFR
Anion gap: 8 (ref 5–15)
BUN: 19 mg/dL (ref 8–23)
CO2: 25 mmol/L (ref 22–32)
Calcium: 10.6 mg/dL — ABNORMAL HIGH (ref 8.9–10.3)
Chloride: 103 mmol/L (ref 98–111)
Creatinine, Ser: 1.36 mg/dL — ABNORMAL HIGH (ref 0.61–1.24)
GFR, Estimated: 53 mL/min — ABNORMAL LOW (ref >60)
Glucose, Bld: 116 mg/dL — ABNORMAL HIGH (ref 70–99)
Potassium: 5.2 mmol/L — ABNORMAL HIGH (ref 3.5–5.1)
Sodium: 135 mmol/L (ref 135–145)

## 2024-02-10 LAB — CBC
HCT: 44.3 % (ref 39.0–52.0)
Hemoglobin: 15 g/dL (ref 13.0–17.0)
MCH: 31.7 pg (ref 26.0–34.0)
MCHC: 33.9 g/dL (ref 30.0–36.0)
MCV: 93.7 fL (ref 80.0–100.0)
Platelets: 172 K/uL (ref 150–400)
RBC: 4.73 MIL/uL (ref 4.22–5.81)
RDW: 13.7 % (ref 11.5–15.5)
WBC: 9.7 K/uL (ref 4.0–10.5)
nRBC: 0 % (ref 0.0–0.2)

## 2024-02-12 NOTE — Progress Notes (Signed)
 Faxed blood work to patient PCP per request.

## 2024-02-14 ENCOUNTER — Ambulatory Visit (INDEPENDENT_AMBULATORY_CARE_PROVIDER_SITE_OTHER)

## 2024-02-14 DIAGNOSIS — I480 Paroxysmal atrial fibrillation: Secondary | ICD-10-CM

## 2024-02-14 LAB — CUP PACEART REMOTE DEVICE CHECK
Date Time Interrogation Session: 20251108230034
Implantable Pulse Generator Implant Date: 20200918

## 2024-02-15 NOTE — Progress Notes (Signed)
 Anesthesia Chart Review   Case: [id number] Date/Time: 02/18/24 1300   Procedures:      INSERTION, RADIATION SOURCE, PROSTATE     INJECTION, HYDROGEL SPACER   Anesthesia type: General   Diagnosis: Prostate cancer (HCC) [C61]   Pre-op diagnosis: prostate cancer   Location: [facility] PROC[facility] / [facility]   Surgeons: Luis Belvie CROME, MD       DISCUSSION:78 y.o. former smoker with h/o HTN, PAF, CAD, loop recorder in place, prostate cancer scheduled for above procedure 02/18/24 with Dr. Belvie Luis.   Pt last seen by cardiology 06/09/2023. PAF s/p ablation 2014, no recurrence. Nonobstructive CAD on cath 2008.  Pt asymptomatic.  VS: BP 115/73   Pulse 69   Temp 36.8 C (Oral)   Resp 14   Ht 5' 11 (1.803 m)   Wt 76.2 kg   SpO2 98%   BMI 23.43 kg/m   PROVIDERS: Luis [name] is PCP    LABS: Labs reviewed: Acceptable for surgery. (all labs ordered are listed, but only abnormal results are displayed)  Labs Reviewed  BASIC METABOLIC PANEL WITH GFR - Abnormal; Notable for the following components:      Result Value   Potassium 5.2 (*)    Glucose, Bld 116 (*)    Creatinine, Ser 1.36 (*)    Calcium  10.6 (*)    GFR, Estimated 53 (*)    All other components within normal limits  CBC     IMAGES:   EKG:   CV: Echo 09/06/2012 - Left ventricle: The cavity size was normal. Wall thickness    was normal. Systolic function was normal. The estimated    ejection fraction was in the range of 55% to 60%.  - Aortic valve: Structurally normal valve. Trileaflet. Cusp    separation was normal.  - Aorta: The aorta was normal, not dilated, and    non-diseased.  - Mitral valve: Structurally normal valve. Trivial    regurgitation.  - Left atrium: The atrium was dilated. No evidence of    thrombus in the atrial cavity or appendage.  - Right ventricle: The cavity size was normal. Wall    thickness was normal. Systolic function was normal.  - Right atrium: No evidence of thrombus in  the atrial cavity    or appendage.  - Atrial septum: No defect or patent foramen ovale was    identified. Echo contrast study showed no right-to-left    atrial level shunt, following an increase in RA pressure    induced by provocative maneuvers.  - Tricuspid valve: Structurally normal valve. Trivial    regurgitation.  - Pulmonic valve: Structurally normal valve. No    regurgitation.  Past Medical History:  Diagnosis Date   Allergy    SEASONAL   Arthritis    BACK AND KNEES   Complication of anesthesia    pt states aspirated following ablation    Coronary artery disease    Dysrhythmia    Elevated PSA    GERD (gastroesophageal reflux disease)    History of kidney stones 07/2006   x 1   Hyperlipidemia    Hypertension    Normal coronary arteries 2008   Normal LV function   Ocular migraine    Paroxysmal atrial fibrillation (HCC)    2008 and 79985, PVI 09/07/12   Prostate cancer [facility])     Past Surgical History:  Procedure Laterality Date   ATRIAL ABLATION SURGERY  09/07/2012   PVI by Dr Kelsie   ATRIAL FIBRILLATION  ABLATION N/A 09/07/2012   Procedure: ATRIAL FIBRILLATION ABLATION;  Surgeon: Lynwood Rakers, MD;  Location: [facility] CATH LAB;  Service: Cardiovascular;  Laterality: N/A;   CARDIAC CATHETERIZATION Left 07/20/2006   No significant CAD, EF 60%, continue medical therapy   COLONOSCOPY  05/13/2021   CYSTOSCOPY W/ RETROGRADES Left 02/28/2019   Procedure: CYSTOSCOPY WITH RETROGRADE PYELOGRAM;  Surgeon: Elisabeth Valli BIRCH, MD;  Location: [facility];  Service: Urology;  Laterality: Left;   CYSTOSCOPY/URETEROSCOPY/HOLMIUM LASER/STENT PLACEMENT Left 03/08/2019   Procedure: CYSTOSCOPY/URETEROSCOPY/HOLMIUM LASER/STENT PLACEMENT;  Surgeon: Elisabeth Valli BIRCH, MD;  Location[facility];  Service: Urology;  Laterality: Left;  1 HR   EP IMPLANTABLE DEVICE N/A 01/23/2015   Procedure: Loop Recorder Insertion;  Surgeon: Lynwood Rakers, MD;  Location: [facility] INVASIVE CV LAB;  Service:  Cardiovascular;  Laterality: N/A;   implantable loop recorder placement  12/24/2018   previous ILR removed and a new MDT Reveal LINQ2 implanted in its place in office by Dr Rakers   KNEE ARTHROSCOPY Right    PROSTATE BIOPSY     TEE WITHOUT CARDIOVERSION N/A 09/06/2012   Procedure: TRANSESOPHAGEAL ECHOCARDIOGRAM (TEE);  Surgeon: Vinie KYM Maxcy, MD;  Location: [facility] ENDOSCOPY;  Service: Cardiovascular;  Laterality: N/A;   TOTAL KNEE ARTHROPLASTY Right 12/04/2020   Procedure: TOTAL KNEE ARTHROPLASTY;  Surgeon: Ernie Cough, MD;  Location: [facility];  Service: Orthopedics;  Laterality: Right;   UPPER GASTROINTESTINAL ENDOSCOPY      MEDICATIONS:  fluticasone (FLONASE) 50 MCG/ACT nasal spray   aspirin  EC 81 MG tablet   celecoxib  (CELEBREX ) 200 MG capsule   doxycycline (VIBRAMYCIN) 100 MG capsule   metoprolol  succinate (TOPROL -XL) 50 MG 24 hr tablet   omeprazole  (PRILOSEC) 20 MG capsule   rosuvastatin  (CRESTOR ) 10 MG tablet   sulfamethoxazole -trimethoprim  (BACTRIM  DS) 800-160 MG tablet   No current facility-administered medications for this encounter.   Luis Hoots Ward, PA-C [facility] Pre-Surgical Testing [phone]

## 2024-02-17 ENCOUNTER — Telehealth: Payer: Self-pay | Admitting: *Deleted

## 2024-02-17 ENCOUNTER — Ambulatory Visit: Payer: Self-pay | Admitting: Cardiovascular Disease

## 2024-02-17 NOTE — Telephone Encounter (Signed)
 CALLED PATIENT TO REMIND OF PROCEDURE FOR 02-18-24, SPOKE WITH PATIENT AND HE IS AWARE OF THIS PROCEDURE

## 2024-02-18 ENCOUNTER — Ambulatory Visit (HOSPITAL_COMMUNITY): Payer: Self-pay | Admitting: Physician Assistant

## 2024-02-18 ENCOUNTER — Encounter (HOSPITAL_COMMUNITY)
Admission: RE | Disposition: A | Discharge status: Home / Self Care | Payer: Self-pay | Source: Home / Self Care | Attending: Urology

## 2024-02-18 ENCOUNTER — Encounter (HOSPITAL_COMMUNITY): Payer: Self-pay | Admitting: Urology

## 2024-02-18 ENCOUNTER — Ambulatory Visit (HOSPITAL_COMMUNITY)
Admission: RE | Admit: 2024-02-18 | Discharge: 2024-02-18 | Disposition: A | Discharge status: Home / Self Care | Attending: Urology | Admitting: Urology

## 2024-02-18 ENCOUNTER — Ambulatory Visit (HOSPITAL_COMMUNITY): Admitting: Anesthesiology

## 2024-02-18 ENCOUNTER — Ambulatory Visit (HOSPITAL_COMMUNITY)

## 2024-02-18 DIAGNOSIS — Z79899 Other long term (current) drug therapy: Secondary | ICD-10-CM | POA: Insufficient documentation

## 2024-02-18 DIAGNOSIS — I10 Essential (primary) hypertension: Secondary | ICD-10-CM | POA: Diagnosis not present

## 2024-02-18 DIAGNOSIS — Z191 Hormone sensitive malignancy status: Secondary | ICD-10-CM | POA: Diagnosis not present

## 2024-02-18 DIAGNOSIS — K219 Gastro-esophageal reflux disease without esophagitis: Secondary | ICD-10-CM | POA: Insufficient documentation

## 2024-02-18 DIAGNOSIS — M1990 Unspecified osteoarthritis, unspecified site: Secondary | ICD-10-CM | POA: Insufficient documentation

## 2024-02-18 DIAGNOSIS — I480 Paroxysmal atrial fibrillation: Secondary | ICD-10-CM | POA: Diagnosis not present

## 2024-02-18 DIAGNOSIS — I2510 Atherosclerotic heart disease of native coronary artery without angina pectoris: Secondary | ICD-10-CM

## 2024-02-18 DIAGNOSIS — E785 Hyperlipidemia, unspecified: Secondary | ICD-10-CM | POA: Diagnosis not present

## 2024-02-18 DIAGNOSIS — C61 Malignant neoplasm of prostate: Secondary | ICD-10-CM | POA: Insufficient documentation

## 2024-02-18 DIAGNOSIS — Z87891 Personal history of nicotine dependence: Secondary | ICD-10-CM | POA: Insufficient documentation

## 2024-02-18 DIAGNOSIS — Z8249 Family history of ischemic heart disease and other diseases of the circulatory system: Secondary | ICD-10-CM | POA: Diagnosis not present

## 2024-02-18 DIAGNOSIS — K5790 Diverticulosis of intestine, part unspecified, without perforation or abscess without bleeding: Secondary | ICD-10-CM | POA: Insufficient documentation

## 2024-02-18 HISTORY — PX: SPACE OAR INSTILLATION: SHX6769

## 2024-02-18 HISTORY — PX: RADIOACTIVE SEED IMPLANT: SHX5150

## 2024-02-18 SURGERY — INSERTION, RADIATION SOURCE, PROSTATE
Anesthesia: General

## 2024-02-18 MED ORDER — PROPOFOL 10 MG/ML IV BOLUS
INTRAVENOUS | Status: AC
  Filled 2024-02-18: qty 20

## 2024-02-18 MED ORDER — ONDANSETRON HCL 4 MG/2ML IJ SOLN
INTRAMUSCULAR | Status: AC
  Filled 2024-02-18: qty 2

## 2024-02-18 MED ORDER — FENTANYL CITRATE (PF) 100 MCG/2ML IJ SOLN
INTRAMUSCULAR | Status: AC
  Filled 2024-02-18: qty 2

## 2024-02-18 MED ORDER — SODIUM CHLORIDE (PF) 0.9 % IJ SOLN
INTRAMUSCULAR | Status: DC | PRN
  Administered 2024-02-18: 10 mL

## 2024-02-18 MED ORDER — FENTANYL CITRATE (PF) 50 MCG/ML IJ SOSY
PREFILLED_SYRINGE | INTRAMUSCULAR | Status: AC
  Filled 2024-02-18: qty 1

## 2024-02-18 MED ORDER — PROPOFOL 10 MG/ML IV BOLUS
INTRAVENOUS | Status: DC | PRN
  Administered 2024-02-18 (×3): 30 mg via INTRAVENOUS
  Administered 2024-02-18: 100 mg via INTRAVENOUS

## 2024-02-18 MED ORDER — FENTANYL CITRATE (PF) 50 MCG/ML IJ SOSY
25.0000 ug | PREFILLED_SYRINGE | INTRAMUSCULAR | Status: DC | PRN
  Administered 2024-02-18 (×2): 50 ug via INTRAVENOUS

## 2024-02-18 MED ORDER — SUGAMMADEX SODIUM 200 MG/2ML IV SOLN
INTRAVENOUS | Status: AC
  Filled 2024-02-18: qty 2

## 2024-02-18 MED ORDER — SODIUM CHLORIDE (PF) 0.9 % IJ SOLN
INTRAMUSCULAR | Status: AC
  Filled 2024-02-18: qty 10

## 2024-02-18 MED ORDER — OXYCODONE HCL 5 MG/5ML PO SOLN: 5.0000 mg | Freq: Once | ORAL | Status: AC | PRN

## 2024-02-18 MED ORDER — SUGAMMADEX SODIUM 200 MG/2ML IV SOLN
INTRAVENOUS | Status: AC
  Filled 2024-02-18: qty 4

## 2024-02-18 MED ORDER — OXYCODONE HCL 5 MG PO TABS
5.0000 mg | ORAL_TABLET | Freq: Once | ORAL | Status: AC | PRN
  Administered 2024-02-18: 5 mg via ORAL

## 2024-02-18 MED ORDER — PHENYLEPHRINE HCL (PRESSORS) 10 MG/ML IV SOLN
INTRAVENOUS | Status: DC | PRN
  Administered 2024-02-18 (×2): 40 ug via INTRAVENOUS
  Administered 2024-02-18: 80 ug via INTRAVENOUS

## 2024-02-18 MED ORDER — CHLORHEXIDINE GLUCONATE 0.12 % MT SOLN
15.0000 mL | Freq: Once | OROMUCOSAL | Status: AC
  Administered 2024-02-18: 15 mL via OROMUCOSAL

## 2024-02-18 MED ORDER — PHENYLEPHRINE 80 MCG/ML (10ML) SYRINGE FOR IV PUSH (FOR BLOOD PRESSURE SUPPORT)
PREFILLED_SYRINGE | INTRAVENOUS | Status: DC | PRN
  Administered 2024-02-18 (×2): 80 ug via INTRAVENOUS

## 2024-02-18 MED ORDER — CEFAZOLIN SODIUM-DEXTROSE 2-4 GM/100ML-% IV SOLN
2.0000 g | INTRAVENOUS | Status: AC
Start: 2024-02-18 — End: 2024-02-18
  Administered 2024-02-18: 2 g via INTRAVENOUS
  Filled 2024-02-18: qty 100

## 2024-02-18 MED ORDER — DEXAMETHASONE SOD PHOSPHATE PF 10 MG/ML IJ SOLN
INTRAMUSCULAR | Status: DC | PRN
  Administered 2024-02-18: 4 mg via INTRAVENOUS

## 2024-02-18 MED ORDER — FENTANYL CITRATE (PF) 100 MCG/2ML IJ SOLN
INTRAMUSCULAR | Status: DC | PRN
  Administered 2024-02-18: 50 ug via INTRAVENOUS

## 2024-02-18 MED ORDER — EPHEDRINE 5 MG/ML INJ
INTRAVENOUS | Status: AC
  Filled 2024-02-18: qty 5

## 2024-02-18 MED ORDER — PHENYLEPHRINE 80 MCG/ML (10ML) SYRINGE FOR IV PUSH (FOR BLOOD PRESSURE SUPPORT)
PREFILLED_SYRINGE | INTRAVENOUS | Status: AC
  Filled 2024-02-18: qty 50

## 2024-02-18 MED ORDER — LIDOCAINE HCL (PF) 2 % IJ SOLN
INTRAMUSCULAR | Status: AC
  Filled 2024-02-18: qty 5

## 2024-02-18 MED ORDER — AMISULPRIDE (ANTIEMETIC) 5 MG/2ML IV SOLN: 10.0000 mg | Freq: Once | INTRAVENOUS | Status: DC | PRN

## 2024-02-18 MED ORDER — LACTATED RINGERS IV SOLN: INTRAVENOUS | Status: DC

## 2024-02-18 MED ORDER — ROCURONIUM BROMIDE 10 MG/ML (PF) SYRINGE
PREFILLED_SYRINGE | INTRAVENOUS | Status: DC | PRN
  Administered 2024-02-18: 30 mg via INTRAVENOUS
  Administered 2024-02-18: 20 mg via INTRAVENOUS
  Administered 2024-02-18: 50 mg via INTRAVENOUS

## 2024-02-18 MED ORDER — SUGAMMADEX SODIUM 200 MG/2ML IV SOLN
INTRAVENOUS | Status: DC | PRN
  Administered 2024-02-18: 200 mg via INTRAVENOUS

## 2024-02-18 MED ORDER — ROCURONIUM BROMIDE 10 MG/ML (PF) SYRINGE
PREFILLED_SYRINGE | INTRAVENOUS | Status: AC
  Filled 2024-02-18: qty 10

## 2024-02-18 MED ORDER — ORAL CARE MOUTH RINSE: 15.0000 mL | Freq: Once | OROMUCOSAL | Status: AC

## 2024-02-18 MED ORDER — IOHEXOL 300 MG/ML  SOLN
INTRAMUSCULAR | Status: DC | PRN
  Administered 2024-02-18: 7 mL

## 2024-02-18 MED ORDER — EPHEDRINE SULFATE (PRESSORS) 25 MG/5ML IV SOSY
PREFILLED_SYRINGE | INTRAVENOUS | Status: DC | PRN
  Administered 2024-02-18: 10 mg via INTRAVENOUS

## 2024-02-18 MED ORDER — TRAMADOL HCL 50 MG PO TABS: 50.0000 mg | ORAL_TABLET | Freq: Four times a day (QID) | ORAL | 0 refills | Status: DC | PRN

## 2024-02-18 MED ORDER — LIDOCAINE HCL (PF) 2 % IJ SOLN
INTRAMUSCULAR | Status: DC | PRN
  Administered 2024-02-18: 100 mg via INTRADERMAL

## 2024-02-18 MED ORDER — ACETAMINOPHEN 500 MG PO TABS
1000.0000 mg | ORAL_TABLET | Freq: Once | ORAL | Status: AC
  Administered 2024-02-18: 1000 mg via ORAL
  Filled 2024-02-18: qty 2

## 2024-02-18 MED ORDER — OXYCODONE HCL 5 MG PO TABS
ORAL_TABLET | ORAL | Status: AC
  Filled 2024-02-18: qty 1

## 2024-02-18 SURGICAL SUPPLY — 41 items
BAG URINE DRAIN 2000ML AR STRL (UROLOGICAL SUPPLIES) ×1 IMPLANT
BARD QuickLink Cartridges with BrachySource I-125 IMPLANT
BLADE CLIPPER SENSICLIP SURGIC (BLADE) ×1 IMPLANT
CATH FOLEY 2WAY SLVR 5CC 16FR (CATHETERS) ×2 IMPLANT
CATH ROBINSON RED A/P 20FR (CATHETERS) ×1 IMPLANT
CLOTH BEACON ORANGE TIMEOUT ST (SAFETY) ×1 IMPLANT
COVER BACK TABLE 60X90IN (DRAPES) ×1 IMPLANT
COVER MAYO STAND STRL (DRAPES) ×1 IMPLANT
DRSG TEGADERM 4X4.75 (GAUZE/BANDAGES/DRESSINGS) ×2 IMPLANT
DRSG TEGADERM 8X12 (GAUZE/BANDAGES/DRESSINGS) ×2 IMPLANT
GLOVE BIO SURGEON STRL SZ 6.5 (GLOVE) IMPLANT
GLOVE BIO SURGEON STRL SZ7.5 (GLOVE) ×1 IMPLANT
GLOVE BIO SURGEON STRL SZ8 (GLOVE) ×1 IMPLANT
GLOVE BIOGEL PI IND STRL 6.5 (GLOVE) IMPLANT
GLOVE ECLIPSE 8.0 STRL XLNG CF (GLOVE) ×1 IMPLANT
GLOVE SURG ORTHO 8.5 STRL (GLOVE) IMPLANT
GLOVE SURG SS PI 7.5 STRL IVOR (GLOVE) IMPLANT
GOWN STRL REUS W/TWL XL LVL3 (GOWN DISPOSABLE) ×1 IMPLANT
GRID BRACH TEMP 18GA 2.8X3X.75 (MISCELLANEOUS) ×1 IMPLANT
HOLDER FOLEY CATH W/STRAP (MISCELLANEOUS) ×1 IMPLANT
IMPL SPACEOAR SYSTEM 10ML (Spacer) ×1 IMPLANT
IV NS 1000ML BAXH (IV SOLUTION) ×1 IMPLANT
KIT TURNOVER CYSTO (KITS) ×1 IMPLANT
MANIFOLD NEPTUNE II (INSTRUMENTS) IMPLANT
NDL BRACHY 18G 5PK (NEEDLE) ×4 IMPLANT
NDL BRACHYTHERAPY 18GX20 (NEEDLE) IMPLANT
NDL PK MORGANSTERN STABILIZ (NEEDLE) ×1 IMPLANT
NEEDLE BRACHY 18G 5PK (NEEDLE) ×4 IMPLANT
NEEDLE BRACHYTHERAPY 18GX20 (NEEDLE) ×4 IMPLANT
NEEDLE PK MORGANSTERN STABILIZ (NEEDLE) ×1 IMPLANT
PACK CYSTO (CUSTOM PROCEDURE TRAY) ×1 IMPLANT
PENCIL SMOKE EVACUATOR (MISCELLANEOUS) IMPLANT
SHEATH ULTRASOUND LF (SHEATH) IMPLANT
SHEATH ULTRASOUND LTX NONSTRL (SHEATH) IMPLANT
SLEEVE SCD COMPRESS KNEE MED (STOCKING) ×1 IMPLANT
SURGILUBE 2OZ TUBE FLIPTOP (MISCELLANEOUS) ×1 IMPLANT
SYR 10ML LL (SYRINGE) ×1 IMPLANT
TOWEL OR 17X24 6PK STRL BLUE (TOWEL DISPOSABLE) ×1 IMPLANT
UNDERPAD 30X36 HEAVY ABSORB (UNDERPADS AND DIAPERS) ×2 IMPLANT
WATER STERILE IRR 3000ML UROMA (IV SOLUTION) ×1 IMPLANT
WATER STERILE IRR 500ML POUR (IV SOLUTION) ×1 IMPLANT

## 2024-02-18 NOTE — Anesthesia Preprocedure Evaluation (Addendum)
 Anesthesia Evaluation  Patient identified by MRN, date of birth, ID band Patient awake    Reviewed: Allergy & Precautions, NPO status , Patient's Chart, lab work & pertinent test results  History of Anesthesia Complications (+) history of anesthetic complications (aspiration after ablation)  Airway Mallampati: II  TM Distance: >3 FB Neck ROM: Full    Dental  (+) Dental Advisory Given, Partial Upper   Pulmonary neg shortness of breath, neg sleep apnea, neg COPD, neg recent URI, former smoker   Pulmonary exam normal breath sounds clear to auscultation       Cardiovascular hypertension (metoprolol ), Pt. on home beta blockers (-) angina + CAD  (-) Past MI, (-) Cardiac Stents and (-) CABG + dysrhythmias Atrial Fibrillation  Rhythm:Regular Rate:Normal  HLD, loop recorder   Neuro/Psych  Headaches (ocular migraines), neg Seizures    GI/Hepatic Neg liver ROS,GERD  Medicated,,Diverticulosis    Endo/Other  negative endocrine ROS    Renal/GU negative Renal ROS   Prostate cancer    Musculoskeletal  (+) Arthritis ,    Abdominal   Peds  Hematology negative hematology ROS (+) Lab Results      Component                Value               Date                      WBC                      9.7                 02/10/2024                HGB                      15.0                02/10/2024                HCT                      44.3                02/10/2024                MCV                      93.7                02/10/2024                PLT                      172                 02/10/2024              Anesthesia Other Findings   Reproductive/Obstetrics                              Anesthesia Physical Anesthesia Plan  ASA: 3  Anesthesia Plan: General   Post-op Pain Management: Tylenol  PO (pre-op)*   Induction: Intravenous  PONV Risk Score and Plan: 2 and Ondansetron , Dexamethasone  and  Treatment may vary due to age or medical  condition  Airway Management Planned: Oral ETT  Additional Equipment:   Intra-op Plan:   Post-operative Plan: Extubation in OR  Informed Consent: I have reviewed the patients History and Physical, chart, labs and discussed the procedure including the risks, benefits and alternatives for the proposed anesthesia with the patient or authorized representative who has indicated his/her understanding and acceptance.     Dental advisory given  Plan Discussed with: CRNA and Anesthesiologist  Anesthesia Plan Comments: (Risks of general anesthesia discussed including, but not limited to, sore throat, hoarse voice, chipped/damaged teeth, injury to vocal cords, nausea and vomiting, allergic reactions, lung infection, heart attack, stroke, and death. All questions answered. )         Anesthesia Quick Evaluation

## 2024-02-18 NOTE — H&P (Signed)
 HPI: Mr Luis [name] is a 78yo here for brachytherapy with [facility]. Biopsy revealed Gleason 4+3=7 in 3/3 cores ROI, gleason 3+4=7 in 1/12 cores and gleason 3+3=6 in 2/12 cores. PSA 8.0. Prostate volume 23cc. He developed a prostate infection after his biopsy which was treated with keflex  for 1 week. He then developed epididymo-orchitis and is currently on a 14 day course of bactrim .      PMH:     Past Medical History:  Diagnosis Date   Allergy      SEASONAL   Arthritis      BACK AND KNEES   Complication of anesthesia      pt states aspirated following ablation    Coronary artery disease     Dysrhythmia     GERD (gastroesophageal reflux disease)     History of kidney stones 07/2006    x 1   Hyperlipidemia     Hypertension     Normal coronary arteries 2008    Normal LV function   Paroxysmal atrial fibrillation (HCC)      2008 and 79985, PVI 09/07/12          Surgical History:      Past Surgical History:  Procedure Laterality Date   ATRIAL ABLATION SURGERY   09/07/2012    PVI by Dr Kelsie   ATRIAL FIBRILLATION ABLATION N/A 09/07/2012    Procedure: ATRIAL FIBRILLATION ABLATION;  Surgeon: Lynwood Kelsie, MD;  Location: [facility] CATH LAB;  Service: Cardiovascular;  Laterality: N/A;   CARDIAC CATHETERIZATION Left 07/20/2006    No significant CAD, EF 60%, continue medical therapy   COLONOSCOPY   05/13/2021   CYSTOSCOPY W/ RETROGRADES Left 02/28/2019    Procedure: CYSTOSCOPY WITH RETROGRADE PYELOGRAM;  Surgeon: Elisabeth Valli BIRCH, MD;  Location[facility];  Service: Urology;  Laterality: Left;   CYSTOSCOPY/URETEROSCOPY/HOLMIUM LASER/STENT PLACEMENT Left 03/08/2019    Procedure: CYSTOSCOPY/URETEROSCOPY/HOLMIUM LASER/STENT PLACEMENT;  Surgeon: Elisabeth Valli BIRCH, MD;  Location: [facility];  Service: Urology;  Laterality: Left;  1 HR   EP IMPLANTABLE DEVICE N/A 01/23/2015    Procedure: Loop Recorder Insertion;  Surgeon: Lynwood Kelsie, MD;  Location: [facility] INVASIVE CV LAB;  Service:  Cardiovascular;  Laterality: N/A;   implantable loop recorder placement   12/24/2018    previous ILR removed and a new MDT Reveal LINQ2 implanted in its place in office by Dr Kelsie   KNEE ARTHROSCOPY Right     TEE WITHOUT CARDIOVERSION N/A 09/06/2012    Procedure: TRANSESOPHAGEAL ECHOCARDIOGRAM (TEE);  Surgeon: Vinie KYM Maxcy, MD;  Location: [facility] ENDOSCOPY;  Service: Cardiovascular;  Laterality: N/A;   TOTAL KNEE ARTHROPLASTY Right 12/04/2020    Procedure: TOTAL KNEE ARTHROPLASTY;  Surgeon: Ernie Cough, MD;  Location: [facility];  Service: Orthopedics;  Laterality: Right;   UPPER GASTROINTESTINAL ENDOSCOPY              Home Medications:  Allergies as of 10/19/2023         Reactions    Ciprofloxacin  Rash            Medication List           Accurate as of October 19, 2023  4:11 PM. If you have any questions, ask your nurse or doctor.              aspirin  EC 81 MG tablet Take 1 tablet (81 mg total) by mouth in the morning. Resume once daily aspirin  after completing 28 days of twice daily dosing. Swallow whole.    metoprolol   succinate 50 MG 24 hr tablet Commonly known as: TOPROL -XL Take 1 tablet (50 mg total) by mouth daily. Take with or immediately following a meal.    omeprazole  20 MG capsule Commonly known as: PRILOSEC Take 20 mg by mouth in the morning.    predniSONE 10 MG tablet Commonly known as: DELTASONE Take 10 mg by mouth.    rosuvastatin  10 MG tablet Commonly known as: CRESTOR  Take 1 tablet by mouth once daily    tadalafil  20 MG tablet Commonly known as: CIALIS  Take 1 tablet (20 mg total) by mouth daily as needed.             Allergies:  Allergies      Allergies  Allergen Reactions   Ciprofloxacin  Rash        Family History:      Family History  Problem Relation Age of Onset   Liver cancer Mother     CAD Father     Heart Problems Father     Stroke Father     Hypertension Father     Heart Problems Brother 46        Had stent put in    Stomach cancer Neg Hx     Prostate cancer Neg Hx     Colon cancer Neg Hx            Social History:  reports that he quit smoking about 28 years ago. His smoking use included cigarettes. He started smoking about 63 years ago. He has never used smokeless tobacco. He reports current alcohol  use of about 7.0 standard drinks of alcohol  per week. He reports that he does not use drugs.   ROS: All other review of systems were reviewed and are negative except what is noted above in HPI   Physical Exam: BP (!) 145/79   Pulse 79   Constitutional:  Alert and oriented, No acute distress. HEENT: Horatio AT, moist mucus membranes.  Trachea midline, no masses. Cardiovascular: No clubbing, cyanosis, or edema. Respiratory: Normal respiratory effort, no increased work of breathing. GI: Abdomen is soft, nontender, nondistended, no abdominal masses GU: No CVA tenderness.  Lymph: No cervical or inguinal lymphadenopathy. Skin: No rashes, bruises or suspicious lesions. Neurologic: Grossly intact, no focal deficits, moving all 4 extremities. Psychiatric: Normal mood and affect.   Laboratory Data: Recent Labs       Lab Results  Component Value Date    WBC 5.0 10/02/2023    HGB 14.3 10/02/2023    HCT 42.2 10/02/2023    MCV 94.2 10/02/2023    PLT 131 (L) 10/02/2023        Recent Labs       Lab Results  Component Value Date    CREATININE 0.96 10/02/2023        Recent Labs  No results found for: PSA     Recent Labs  No results found for: TESTOSTERONE     Recent Labs  No results found for: HGBA1C     Urinalysis Labs (Brief)          Component Value Date/Time    COLORURINE RED (A) 10/02/2023 2105    APPEARANCEUR CLOUDY (A) 10/02/2023 2105    APPEARANCEUR Clear 04/15/2023 0834    LABSPEC 1.020 10/02/2023 2105    PHURINE 7.0 10/02/2023 2105    GLUCOSEU NEGATIVE 10/02/2023 2105    HGBUR LARGE (A) 10/02/2023 2105    BILIRUBINUR NEGATIVE 10/02/2023 2105    BILIRUBINUR negative  07/17/2023 0829    BILIRUBINUR  Negative 04/15/2023 0834    KETONESUR 20 (A) 10/02/2023 2105    PROTEINUR 100 (A) 10/02/2023 2105    UROBILINOGEN 0.2 07/17/2023 0829    UROBILINOGEN 1 09/14/2012 1537    NITRITE NEGATIVE 10/02/2023 2105    LEUKOCYTESUR SMALL (A) 10/02/2023 2105        Recent Labs       Lab Results  Component Value Date    LABMICR See below: 04/15/2023    WBCUA 6-10 (A) 04/15/2023    LABEPIT 0-10 04/15/2023    BACTERIA None seen 04/15/2023        Pertinent Imaging:   Results for orders placed during the hospital encounter of 07/10/23   Abdomen 1 view (KUB)   Narrative CLINICAL DATA:  History of nephrolithiasis   EXAM: ABDOMEN - 1 VIEW   COMPARISON:  03/21/2022   FINDINGS: Scattered large and small bowel gas is noted. Kidneys are well visualize without renal or ureteral calculi. Vascular calcifications are seen. Degenerative changes of lumbar spine are noted. No free air is seen.   IMPRESSION: No evidence of nephrolithiasis.     Electronically Signed By: Oneil Devonshire M.D. On: 07/19/2023 23:53   No results found for this or any previous visit.   No results found for this or any previous visit.   No results found for this or any previous visit.   No results found for this or any previous visit.   No results found for this or any previous visit.   No results found for this or any previous visit.   No results found for this or any previous visit.     Assessment & Plan:     1. Prostate cancer (HCC) (Primary) I discussed the natural history of intermediate risk prostate cancer with the patient and the various treatment options including active surveillance, RALP, IMRT, brachytherapy, cryotherapy, HIFU and ADT. After discussing the options the patient elects for brachytherapy with [facility]

## 2024-02-18 NOTE — Transfer of Care (Signed)
 Immediate Anesthesia Transfer of Care Note  Patient: Luis [name]  Procedure(s) Performed: INSERTION, RADIATION SOURCE, PROSTATE INJECTION, HYDROGEL SPACER  Patient Location: PACU  Anesthesia Type:General  Level of Consciousness: awake and alert   Airway & Oxygen Therapy: Patient Spontanous Breathing and Patient connected to face mask oxygen  Post-op Assessment: Report given to RN and Post -op Vital signs reviewed and stable  Post vital signs: Reviewed and stable  Last Vitals:  Vitals Value Taken Time  BP 142/80 02/18/24 15:45  Temp    Pulse 86 02/18/24 15:47  Resp 15 02/18/24 15:47  SpO2 98 % 02/18/24 15:47  Vitals shown include unfiled device data.  Last Pain:  Vitals:   02/18/24 1057  TempSrc:   PainSc: 0-No pain         Complications: No notable events documented.

## 2024-02-18 NOTE — Progress Notes (Signed)
 Remote Loop Recorder Transmission

## 2024-02-18 NOTE — Op Note (Signed)
 PRE-OPERATIVE DIAGNOSIS:  Adenocarcinoma of the prostate  POST-OPERATIVE DIAGNOSIS:  Same  PROCEDURE:  Procedure(s): 1. I-125 radioactive seed implantation 2. Cystoscopy 3. SpaceOAR placement  SURGEON:  Surgeon(s): Belvie Clara, MD  Radiation oncologist: Donnice Barge, MD  ANESTHESIA:  General  EBL:  Minimal  DRAINS: 16 French Foley catheter  INDICATION: Luis [name] is a 78 year old with a history of T1c prostate cancer. After discussing treatment options he has elected to proceed with brachytherapy  Description of procedure: After informed consent the patient was brought to the major OR, placed on the table and administered general anesthesia. He was then moved to the modified lithotomy position with his perineum perpendicular to the floor. His perineum and genitalia were then sterilely prepped. An official timeout was then performed. A 16 French Foley catheter was then placed in the bladder and filled with dilute contrast, a rectal tube was placed in the rectum and the transrectal ultrasound probe was placed in the rectum and affixed to the stand. He was then sterilely draped.  Real time ultrasonography was used along with the seed planning software [employer] Prostate vs. 4.2.21. This was used to develop the seed plan including the number of needles as well as number of seeds required for complete and adequate coverage. Real-time ultrasonography was then used along with the previously developed plan and the Nucletron device to implant a total of 67 seeds using 19 needles. This proceeded without difficulty or complication.  We then proceeded to mix the SpaceOAR using the kit supplied from the manufacturer. Once this was complete we placed a sinal needle into the perirectal fat between the rectum and the prostate. Once this was accomplished we injected 2cc of normal saline to hydrodissect the plain. We then instilled the the SpaceOAR through the spinal needle and noted good  distribution in the perirectal fat.    A Foley catheter was then removed as well as the transrectal ultrasound probe and rectal probe. Flexible cystoscopy was then performed using the 17 French flexible scope which revealed a normal urethra throughout its length down to the sphincter which appeared intact. The prostatic urethra revealed bilobar hypertrophy but no evidence of obstruction, seeds, spacers or lesions. The bladder was then entered and fully and systematically inspected. The ureteral orifices were noted to be of normal configuration and position. The mucosa revealed no evidence of tumors. There were also no stones identified within the bladder. I noted no seeds or spacers on the floor of the bladder and retroflexion of the scope revealed no seeds protruding from the base of the prostate.  The cystoscope was then removed and a new 16 French Foley catheter was then inserted and the balloon was filled with 10 cc of sterile water . This was connected to closed system drainage and the patient was awakened and taken to recovery room in stable and satisfactory condition. He tolerated procedure well and there were no intraoperative complications.

## 2024-02-18 NOTE — Anesthesia Procedure Notes (Signed)
 Procedure Name: Intubation Date/Time: 02/18/2024 1:38 PM  Performed by: Franchot Delon RAMAN, CRNAPre-anesthesia Checklist: Patient identified, Emergency Drugs available, Suction available and Patient being monitored Patient Re-evaluated:Patient Re-evaluated prior to induction Oxygen Delivery Method: Circle system utilized Preoxygenation: Pre-oxygenation with 100% oxygen Induction Type: IV induction Ventilation: Mask ventilation without difficulty Laryngoscope Size: Mac and 4 Grade View: Grade I Tube type: Oral Tube size: 7.5 mm Number of attempts: 1 Airway Equipment and Method: Stylet and Oral airway Placement Confirmation: ETT inserted through vocal cords under direct vision, positive ETCO2 and breath sounds checked- equal and bilateral Secured at: 22 cm Tube secured with: Tape Dental Injury: Teeth and Oropharynx as per pre-operative assessment

## 2024-02-19 ENCOUNTER — Encounter (HOSPITAL_COMMUNITY): Payer: Self-pay | Admitting: Urology

## 2024-02-19 NOTE — Anesthesia Postprocedure Evaluation (Signed)
 Anesthesia Post Note  Patient: BRODIN GELPI  Procedure(s) Performed: INSERTION, RADIATION SOURCE, PROSTATE INJECTION, HYDROGEL SPACER     Patient location during evaluation: PACU Anesthesia Type: General Level of consciousness: awake Pain management: pain level controlled Vital Signs Assessment: post-procedure vital signs reviewed and stable Respiratory status: spontaneous breathing, nonlabored ventilation and respiratory function stable Cardiovascular status: blood pressure returned to baseline and stable Postop Assessment: no apparent nausea or vomiting Anesthetic complications: no   No notable events documented.  Last Vitals:  Vitals:   02/18/24 1745 02/18/24 1830  BP: (!) 157/83 (!) 157/81  Pulse: 99   Resp: 16   Temp: 36.6 C   SpO2: 100%     Last Pain:  Vitals:   02/18/24 1745  TempSrc:   PainSc: 5                  Delon Aisha Arch

## 2024-02-21 ENCOUNTER — Other Ambulatory Visit: Payer: Self-pay | Admitting: Urology

## 2024-02-21 DIAGNOSIS — C61 Malignant neoplasm of prostate: Secondary | ICD-10-CM

## 2024-02-21 NOTE — Progress Notes (Signed)
  Radiation Oncology         [phone] ________________________________  Name: BIRNEY BELSHE MRN: [id number]  Date: 02/21/2024  DOB: 09-Dec-1945       Prostate Seed Implant  RR:Dxjxoz, Massie, DO  No ref. provider found  DIAGNOSIS:   78 y.o. gentleman with Stage T1c adenocarcinoma of the prostate with Gleason score of 4+3, and PSA of 8.0.  Oncology History  Prostate cancer (HCC)  09/30/2023 Cancer Staging   Staging form: Prostate, AJCC 8th Edition - Clinical stage from 09/30/2023: Stage IIC (cT1c, cN0, cM0, PSA: 8, Grade Group: 3) - Signed by Sherwood Rise, PA-C on 11/11/2023 Histopathologic type: Adenocarcinoma, NOS Stage prefix: Initial diagnosis Prostate specific antigen (PSA) range: Less than 10 Gleason primary pattern: 4 Gleason secondary pattern: 3 Gleason score: 7 Histologic grading system: 5 grade system Number of biopsy cores examined: 15 Number of biopsy cores positive: 6 Location of positive needle core biopsies: Both sides   11/11/2023 Initial Diagnosis   Malignant neoplasm of prostate (HCC)       ICD-10-CM   1. Prostate cancer [facility]  C61 Discharge patient      PROCEDURE: Insertion of radioactive I-125 seeds into the prostate gland.  RADIATION DOSE: 145 Gy, definitive therapy.  TECHNIQUE: SAYGE SALVATO was brought to the operating room with the urologist. He was placed in the dorsolithotomy position. He was catheterized and a rectal tube was inserted. The perineum was shaved, prepped and draped. The ultrasound probe was then introduced by me into the rectum to see the prostate gland.  TREATMENT DEVICE: I attached the needle grid to the ultrasound probe stand and anchor needles were placed.  3D PLANNING: The prostate was imaged in 3D using a sagittal sweep of the prostate probe. These images were transferred to the planning computer. There, the prostate, urethra and rectum were defined on each axial reconstructed image. Then, the software created an  optimized 3D plan and a few seed positions were adjusted. The quality of the plan was reviewed using [facility] information for the target and the following two organs at risk:  Urethra and Rectum.  Then the accepted plan was printed and handed off to the radiation therapist.  Under my supervision, the custom loading of the seeds and spacers was carried out using the quick loader.  These pre-loaded needles were then placed into the needle holder.SABRA  PROSTATE VOLUME STUDY:  Using transrectal ultrasound the volume of the prostate was verified to be 31.7 cc.  SPECIAL TREATMENT PROCEDURE/SUPERVISION AND HANDLING: The pre-loaded needles were then delivered by the urologist under sagittal guidance. A total of 19 needles were used to deposit 67 seeds in the prostate gland. The individual seed activity was 0.363 mCi.  SpaceOAR:  Yes  COMPLEX SIMULATION: At the end of the procedure, an anterior radiograph of the pelvis was obtained to document seed positioning and count. Cystoscopy was performed by the urologist to check the urethra and bladder.  MICRODOSIMETRY: At the end of the procedure, the patient was emitting 0.064 mR/hr at 1 meter. Accordingly, he was considered safe for hospital discharge.  PLAN: The patient will return to the radiation oncology clinic for post implant CT dosimetry in three weeks.   ________________________________  Donnice FELIX Patrcia, M.D.

## 2024-02-23 ENCOUNTER — Telehealth: Payer: Self-pay | Admitting: *Deleted

## 2024-02-23 ENCOUNTER — Ambulatory Visit

## 2024-02-23 DIAGNOSIS — N390 Urinary tract infection, site not specified: Secondary | ICD-10-CM

## 2024-02-23 LAB — BLADDER SCAN AMB NON-IMAGING: Scan Result: 78

## 2024-02-23 MED ORDER — CEPHALEXIN 500 MG PO CAPS: 500.0000 mg | ORAL_CAPSULE | Freq: Once | ORAL | Status: AC

## 2024-02-23 NOTE — Telephone Encounter (Signed)
 RETURNED PATIENT'S PHONE CALL, LVM FOR A RETURN CALL

## 2024-02-23 NOTE — Progress Notes (Cosign Needed Addendum)
 Fill and Pull Catheter Removal  Patient is present today for a catheter removal due to UTI without hematuria.  150 ml of sterile water  was instilled into the bladder when the patient felt the urge to urinate. 10 ml of water  was then drained from the balloon.  A 16 FR foley cath was removed from the bladder no complications were noted .  Foley catheter intact and time of removal. Patient as then given some time to void on their own.  Patient can void  100 ml on their own after some time.  Patient tolerated well.  One oral prophylactic antibiotic given per MD orders  Performed by: Luis [name], CMA  Follow up/ Additional notes: Pt will return @ 1 pm PVR  Bladder Scan completed today due to reason Surgery INSERTION, RADIATION SOURCE, PROSTATE   Patient can void prior to the bladder scan. Bladder scan result: 78  Performed By: Luis [name], CMA  Additional notes- Patient is scheduled to follow up with Keep poet-op appointment with Dr. Sherrilee

## 2024-02-26 NOTE — Progress Notes (Incomplete)
 Post-seed nursing interview for a diagnosis of 78 y.o. gentleman with Stage T1c adenocarcinoma of the prostate with Gleason score of 4+3, and PSA of 8.0.   Patient identity verified x2.   Patient states issues as follows...  -Pain: *** -Fatigue: *** -Abdomen: *** -Groin: *** -Urinary: *** -Bowels: *** -Appetite: *** -Weight:  Patient denies all other related issues at this time.  Meaningful use complete.  I-PSS (AUA) score- *** - {(BH) RANGE ABSENT/SEVERE:20013:s} SHIM (ED) score- *** Urinary Management medication(s): Alfuzosin  Urology appointment date- *** with Dr. Belvie Clara at [facility]- ***  This concludes the interaction.

## 2024-02-29 NOTE — Progress Notes (Signed)
 Patient was a RadOnc Consult on 11/10/23 for his stage T1c adenocarcinoma of the prostate with Gleason score of 4+3, and PSA of 8.0.  Patient proceed with treatment recommendations of brachytherapy and had his treatment on 02/18/24.   Patient is scheduled for a post seed CT Simulation on 12/3 and urology follow up on 03/02/24.

## 2024-03-01 DIAGNOSIS — C4441 Basal cell carcinoma of skin of scalp and neck: Secondary | ICD-10-CM | POA: Diagnosis not present

## 2024-03-01 DIAGNOSIS — Z85828 Personal history of other malignant neoplasm of skin: Secondary | ICD-10-CM | POA: Diagnosis not present

## 2024-03-01 DIAGNOSIS — D485 Neoplasm of uncertain behavior of skin: Secondary | ICD-10-CM | POA: Diagnosis not present

## 2024-03-02 ENCOUNTER — Ambulatory Visit (INDEPENDENT_AMBULATORY_CARE_PROVIDER_SITE_OTHER): Admitting: Urology

## 2024-03-02 ENCOUNTER — Encounter: Payer: Self-pay | Admitting: Urology

## 2024-03-02 VITALS — BP 138/58 | HR 58

## 2024-03-02 DIAGNOSIS — R350 Frequency of micturition: Secondary | ICD-10-CM

## 2024-03-02 DIAGNOSIS — R351 Nocturia: Secondary | ICD-10-CM | POA: Diagnosis not present

## 2024-03-02 DIAGNOSIS — N401 Enlarged prostate with lower urinary tract symptoms: Secondary | ICD-10-CM

## 2024-03-02 DIAGNOSIS — N400 Enlarged prostate without lower urinary tract symptoms: Secondary | ICD-10-CM

## 2024-03-02 DIAGNOSIS — C61 Malignant neoplasm of prostate: Secondary | ICD-10-CM

## 2024-03-02 MED ORDER — ALFUZOSIN HCL ER 10 MG PO TB24: 10.0000 mg | ORAL_TABLET | Freq: Every day | ORAL | 11 refills | Status: AC

## 2024-03-02 NOTE — Patient Instructions (Signed)

## 2024-03-02 NOTE — Progress Notes (Signed)
 03/02/2024 11:13 AM   Luis [name] [birth year] [id number]  Referring provider: Valentin Skates, DO [address] [place],  [place] [address]  Followup after brachytherapy    HPI: Mr Luis [name] is a 78yo here for followup after bachytherapy and for evaluation of nocturia. IPSS 25 QOL 5 on no BPH therapy. Since brachytherapy he has noted increased urinary frequency to every 2 hours, urgency, nocturia 4x. Urine stream fair. No straining to urinate. He has occasional dysuria.    PMH: Past Medical History:  Diagnosis Date   Allergy    SEASONAL   Arthritis    BACK AND KNEES   Complication of anesthesia    pt states aspirated following ablation    Coronary artery disease    Dysrhythmia    Elevated PSA    GERD (gastroesophageal reflux disease)    History of kidney stones 07/2006   x 1   Hyperlipidemia    Hypertension    Normal coronary arteries 2008   Normal LV function   Ocular migraine    Paroxysmal atrial fibrillation (HCC)    2008 and 79985, PVI 09/07/12   Prostate cancer [facility])     Surgical History: Past Surgical History:  Procedure Laterality Date   ATRIAL ABLATION SURGERY  09/07/2012   PVI by Dr [name]   ATRIAL FIBRILLATION ABLATION N/A 09/07/2012   Procedure: ATRIAL FIBRILLATION ABLATION;  Surgeon: Luis Kelsie, MD;  Location[facility] CATH LAB;  Service: Cardiovascular;  Laterality: N/A;   CARDIAC CATHETERIZATION Left 07/20/2006   No significant CAD, EF 60%, continue medical therapy   COLONOSCOPY  05/13/2021   CYSTOSCOPY W/ RETROGRADES Left 02/28/2019   Procedure: CYSTOSCOPY WITH RETROGRADE PYELOGRAM;  Surgeon: Luis Valli BIRCH, MD;  Location: [facility] ORS;  Service: Urology;  Laterality: Left;   CYSTOSCOPY/URETEROSCOPY/HOLMIUM LASER/STENT PLACEMENT Left 03/08/2019   Procedure: CYSTOSCOPY/URETEROSCOPY/HOLMIUM LASER/STENT PLACEMENT;  Surgeon: Luis Valli BIRCH, MD;  Location: [facility];  Service: Urology;  Laterality: Left;  1 HR   EP IMPLANTABLE DEVICE N/A  01/23/2015   Procedure: Loop Recorder Insertion;  Surgeon: Luis Kelsie, MD;  Location: [facility] INVASIVE CV LAB;  Service: Cardiovascular;  Laterality: N/A;   implantable loop recorder placement  12/24/2018   previous ILR removed and a new MDT Reveal LINQ2 implanted in its place in office by Dr [name]   KNEE ARTHROSCOPY Right    PROSTATE BIOPSY     RADIOACTIVE SEED IMPLANT N/A 02/18/2024   Procedure: INSERTION, RADIATION SOURCE, PROSTATE;  Surgeon: Luis Belvie CROME, MD;  Location: [facility] ORS;  Service: Urology;  Laterality: N/A;   SPACE OAR INSTILLATION N/A 02/18/2024   Procedure: INJECTION, HYDROGEL SPACER;  Surgeon: Luis Belvie CROME, MD;  Location: [facility] ORS;  Service: Urology;  Laterality: N/A;   TEE WITHOUT CARDIOVERSION N/A 09/06/2012   Procedure: TRANSESOPHAGEAL ECHOCARDIOGRAM (TEE);  Surgeon: Luis KYM Maxcy, MD;  Location[facility] ENDOSCOPY;  Service: Cardiovascular;  Laterality: N/A;   TOTAL KNEE ARTHROPLASTY Right 12/04/2020   Procedure: TOTAL KNEE ARTHROPLASTY;  Surgeon: Luis Cough, MD;  Location: [facility] ORS;  Service: Orthopedics;  Laterality: Right;   UPPER GASTROINTESTINAL ENDOSCOPY      Home Medications:  Allergies as of 03/02/2024       Reactions   Ciprofloxacin  Rash        Medication List        Accurate as of March 02, 2024 11:13 AM. If you have any questions, ask your nurse or doctor.          aspirin  EC 81 MG tablet  Take 1 tablet (81 mg total) by mouth in the morning. Resume once daily aspirin  after completing 28 days of twice daily dosing. Swallow whole.   celecoxib  200 MG capsule Commonly known as: CELEBREX  Take 200 mg by mouth daily.   fluticasone 50 MCG/ACT nasal spray Commonly known as: FLONASE Place 1 spray into both nostrils daily.   metoprolol  succinate 50 MG 24 hr tablet Commonly known as: TOPROL -XL TAKE 1 TABLET BY MOUTH DAILY TAKE  WITH  OR  IMMEDIATELY  FOLLOWING  A  MEAL   omeprazole  20 MG capsule Commonly known as: PRILOSEC Take 20 mg by  mouth in the morning.   rosuvastatin  10 MG tablet Commonly known as: CRESTOR  Take 1 tablet by mouth once daily   sulfamethoxazole -trimethoprim  800-160 MG tablet Commonly known as: BACTRIM  DS Take 1 tablet by mouth daily.   traMADol  50 MG tablet Commonly known as: Ultram  Take 1 tablet (50 mg total) by mouth every 6 (six) hours as needed.        Allergies:  Allergies  Allergen Reactions   Ciprofloxacin  Rash    Family History: Family History  Problem Relation Age of Onset   Liver cancer Mother    CAD Father    Heart Problems Father    Stroke Father    Hypertension Father    Heart Problems Brother 28       Had stent put in   Stomach cancer Neg Hx    Prostate cancer Neg Hx    Colon cancer Neg Hx     Social History:  reports that he quit smoking about 29 years ago. His smoking use included cigarettes. He started smoking about 64 years ago. He has never used smokeless tobacco. He reports current alcohol  use of about 7.0 standard drinks of alcohol  per week. He reports that he does not use drugs.  ROS: All other review of systems were reviewed and are negative except what is noted above in HPI  Physical Exam: BP (!) 138/58   Pulse (!) 58   Constitutional:  Alert and oriented, No acute distress. HEENT:  AT, moist mucus membranes.  Trachea midline, no masses. Cardiovascular: No clubbing, cyanosis, or edema. Respiratory: Normal respiratory effort, no increased work of breathing. GI: Abdomen is soft, nontender, nondistended, no abdominal masses GU: No CVA tenderness.  Lymph: No cervical or inguinal lymphadenopathy. Skin: No rashes, bruises or suspicious lesions. Neurologic: Grossly intact, no focal deficits, moving all 4 extremities. Psychiatric: Normal mood and affect.  Laboratory Data: Lab Results  Component Value Date   WBC 9.7 02/10/2024   HGB 15.0 02/10/2024   HCT 44.3 02/10/2024   MCV 93.7 02/10/2024   PLT 172 02/10/2024    Lab Results  Component Value  Date   CREATININE 1.36 (H) 02/10/2024    No results found for: PSA  No results found for: TESTOSTERONE  No results found for: HGBA1C  Urinalysis    Component Value Date/Time   COLORURINE RED (A) 10/02/2023 2105   APPEARANCEUR Clear 12/23/2023 1146   LABSPEC 1.020 10/02/2023 2105   PHURINE 7.0 10/02/2023 2105   GLUCOSEU Negative 12/23/2023 1146   HGBUR LARGE (A) 10/02/2023 2105   BILIRUBINUR Negative 12/23/2023 1146   KETONESUR 20 (A) 10/02/2023 2105   PROTEINUR Trace 12/23/2023 1146   PROTEINUR 100 (A) 10/02/2023 2105   UROBILINOGEN 0.2 07/17/2023 0829   UROBILINOGEN 1 09/14/2012 1537   NITRITE Negative 12/23/2023 1146   NITRITE NEGATIVE 10/02/2023 2105   LEUKOCYTESUR 2+ (A) 12/23/2023 1146  LEUKOCYTESUR SMALL (A) 10/02/2023 2105    Lab Results  Component Value Date   LABMICR See below: 12/23/2023   WBCUA >30 (A) 12/23/2023   LABEPIT 0-10 12/23/2023   BACTERIA Many (A) 12/23/2023    Pertinent Imaging:  Results for orders placed during the hospital encounter of 07/10/23  Abdomen 1 view (KUB)  Narrative CLINICAL DATA:  History of nephrolithiasis  EXAM: ABDOMEN - 1 VIEW  COMPARISON:  03/21/2022  FINDINGS: Scattered large and small bowel gas is noted. Kidneys are well visualize without renal or ureteral calculi. Vascular calcifications are seen. Degenerative changes of lumbar spine are noted. No free air is seen.  IMPRESSION: No evidence of nephrolithiasis.   Electronically Signed By: Oneil Devonshire M.D. On: 07/19/2023 23:53  No results found for this or any previous visit.  No results found for this or any previous visit.  No results found for this or any previous visit.  No results found for this or any previous visit.  No results found for this or any previous visit.  No results found for this or any previous visit.  No results found for this or any previous visit.   Assessment & Plan:    1. Prostate cancer (HCC)  (Primary) -followup 3 months with PSA  2. BPh with urinary frequency, nocturia -uroxatral  10mg  at bedtime.    No follow-ups on file.  Belvie Clara, MD  [facility] [place]

## 2024-03-07 ENCOUNTER — Telehealth: Payer: Self-pay

## 2024-03-07 ENCOUNTER — Other Ambulatory Visit: Payer: Self-pay

## 2024-03-07 ENCOUNTER — Other Ambulatory Visit

## 2024-03-07 DIAGNOSIS — N390 Urinary tract infection, site not specified: Secondary | ICD-10-CM

## 2024-03-07 LAB — MICROSCOPIC EXAMINATION
RBC, Urine: >30 /HPF — AB (ref 0–2)
WBC, UA: >30 /HPF — AB (ref 0–5)

## 2024-03-07 LAB — URINALYSIS, ROUTINE W REFLEX MICROSCOPIC
Bilirubin, UA: NEGATIVE
Glucose, UA: NEGATIVE
Ketones, UA: NEGATIVE
Nitrite, UA: POSITIVE — AB
Specific Gravity, UA: 1.02 (ref 1.005–1.030)
Urobilinogen, Ur: 1 mg/dL (ref 0.2–1.0)
pH, UA: 5.5 (ref 5.0–7.5)

## 2024-03-07 MED ORDER — CEPHALEXIN 500 MG PO CAPS: 500.0000 mg | ORAL_CAPSULE | Freq: Two times a day (BID) | ORAL | 0 refills | Status: AC

## 2024-03-07 NOTE — Telephone Encounter (Signed)
 Called pt back. Pt state's that he had bachytherapy   Dysuria  Patient called with c/o dysuria x 24 hours days.  Pain: burning  Severity:7/10  Associated Signs and Symptoms:  Fever: noTemp.0 Chills: Yes Hematuria: yes dark brown Urgency: yes Frequency: yes Hesitancy:no Incontinence: yes Nausea: yes Vomiting: no  Urologic History:  Any Recent Urologic Surgeries or Procedures:yes bachytherapy  Recurrent UTI's:yes Cystitis: no  Prostatitis:no Kidney or Bladder Stones: no Plan: Walk-in Clinic: no Appointment w/Physician: shana Lab visit scheduled for urine drop off: Yes Advice given:  Do you take on daily medications for UTI suppression No

## 2024-03-07 NOTE — Telephone Encounter (Signed)
 Patient presents today with complaints of  Frequency.  UA and Culture done today.  Dr. Nieves reviewed results and Cephalexin  500 mg BID X 7 days .  Patient aware of MD recommendations and that we will reach out with culture results.      Dyjwpvlj, CMA

## 2024-03-07 NOTE — Telephone Encounter (Signed)
 Left a message on the nurse line. Had issues over the weekend. Passing something, not urine.  Please advise.  Call:  [phone]

## 2024-03-07 NOTE — Telephone Encounter (Signed)
 Has a temp of 100. this afternoon.

## 2024-03-08 ENCOUNTER — Other Ambulatory Visit: Payer: Self-pay | Admitting: Urology

## 2024-03-08 ENCOUNTER — Telehealth: Payer: Self-pay | Admitting: *Deleted

## 2024-03-08 MED ORDER — LORAZEPAM 1 MG PO TABS: 1.0000 mg | ORAL_TABLET | ORAL | 0 refills | Status: DC

## 2024-03-08 MED ORDER — LORAZEPAM 1 MG PO TABS: 1.0000 mg | ORAL_TABLET | ORAL | 0 refills | Status: AC

## 2024-03-08 NOTE — Telephone Encounter (Signed)
 Called patient to remind of post seed  and MRI appt. for 03-09-24, spoke with patient and he is aware of these appts.

## 2024-03-08 NOTE — Telephone Encounter (Signed)
 Called patient to inform that script is ready for pick-up later today, spoke with patient and he is aware of this

## 2024-03-09 ENCOUNTER — Encounter: Payer: Self-pay | Admitting: Urology

## 2024-03-09 ENCOUNTER — Ambulatory Visit (HOSPITAL_COMMUNITY)
Admission: RE | Admit: 2024-03-09 | Discharge: 2024-03-09 | Disposition: A | Discharge status: Home / Self Care | Source: Ambulatory Visit | Attending: Urology | Admitting: Urology

## 2024-03-09 ENCOUNTER — Ambulatory Visit
Admission: RE | Admit: 2024-03-09 | Discharge: 2024-03-09 | Disposition: A | Discharge status: Home / Self Care | Source: Ambulatory Visit | Attending: Urology | Admitting: Urology

## 2024-03-09 ENCOUNTER — Ambulatory Visit
Admission: RE | Admit: 2024-03-09 | Discharge: 2024-03-09 | Disposition: A | Source: Ambulatory Visit | Attending: Urology | Admitting: Urology

## 2024-03-09 VITALS — BP 148/77 | HR 86 | Temp 97.5°F | Resp 18 | Ht 71.0 in | Wt 168.8 lb

## 2024-03-09 DIAGNOSIS — Z923 Personal history of irradiation: Secondary | ICD-10-CM | POA: Insufficient documentation

## 2024-03-09 DIAGNOSIS — Z791 Long term (current) use of non-steroidal anti-inflammatories (NSAID): Secondary | ICD-10-CM | POA: Insufficient documentation

## 2024-03-09 DIAGNOSIS — R350 Frequency of micturition: Secondary | ICD-10-CM | POA: Insufficient documentation

## 2024-03-09 DIAGNOSIS — C61 Malignant neoplasm of prostate: Secondary | ICD-10-CM | POA: Insufficient documentation

## 2024-03-09 DIAGNOSIS — Z79899 Other long term (current) drug therapy: Secondary | ICD-10-CM | POA: Insufficient documentation

## 2024-03-09 DIAGNOSIS — Z7982 Long term (current) use of aspirin: Secondary | ICD-10-CM | POA: Insufficient documentation

## 2024-03-09 NOTE — Progress Notes (Signed)
 Radiation Oncology         [phone]) [phone] ________________________________  Name: Luis [name] MRN: [id number]  Date: 03/09/2024  DOB: [birth year]  Post-Seed Follow-Up Visit Note  CC: Luis Skates, Luis [name]  Luis Belvie CROME, Luis [name]  Diagnosis:   78 y.o. gentleman with Stage T1c adenocarcinoma of the prostate with Gleason score of 4+3, and PSA of 8.0.     ICD-10-CM   1. Prostate cancer (HCC)  C61       Interval Since Last Radiation:  3 weeks 02/18/24:  Insertion of radioactive I-125 seeds into the prostate gland; 145 Gy, definitive therapy with placement of SpaceOAR gel.  Narrative:  The patient returns today for routine follow-up.  He is complaining of increased urinary frequency and urinary hesitation symptoms. He filled out a questionnaire regarding urinary function today providing and overall IPSS score of 28 characterizing his symptoms as severe but improving, currently on Keflex  twice daily for UTI.  He completed his course of Bactrim  prophylaxis on Friday, 03/04/2024 and unfortunately developed symptoms of UTI within 24 hours.  He saw Dr. Sherrilee for follow up on 03/02/24 and was started on Uroxatrol which did seem to help with his LUTS until he developed the UTI.  Since starting the antibiotic, he has discontinued using the Uroxatrol as he was not sure if these could be taken together.  Final C&S remains pending but he is feeling much better on the current antibiotic which is encouraging.  His pre-implant score was 9. He denies any abdominal pain or bowel symptoms.  His energy level is much improved since starting the recent antibiotic and overall, he is pleased with his progress to date.  ALLERGIES:  is allergic to ciprofloxacin .  Meds: Current Outpatient Medications  Medication Sig Dispense Refill   alfuzosin  (UROXATRAL ) 10 MG 24 hr tablet Take 1 tablet (10 mg total) by mouth at bedtime. 30 tablet 11   aspirin  EC 81 MG tablet Take 1 tablet (81 mg total) by mouth in the morning.  Resume once daily aspirin  after completing 28 days of twice daily dosing. Swallow whole. 30 tablet 11   celecoxib  (CELEBREX ) 200 MG capsule Take 200 mg by mouth daily.     cephALEXin  (KEFLEX ) 500 MG capsule Take 1 capsule (500 mg total) by mouth 2 (two) times daily for 7 days. 14 capsule 0   fluticasone (FLONASE) 50 MCG/ACT nasal spray Place 1 spray into both nostrils daily.     LORazepam (ATIVAN) 1 MG tablet Take 1 tablet (1 mg total) by mouth as directed. Take one tablet by mouth 30 minutes prior to MRI and may repeat once, just prior to scan if needed 2 tablet 0   metoprolol  succinate (TOPROL -XL) 50 MG 24 hr tablet TAKE 1 TABLET BY MOUTH DAILY TAKE  WITH  OR  IMMEDIATELY  FOLLOWING  A  MEAL 90 tablet 2   omeprazole  (PRILOSEC) 20 MG capsule Take 20 mg by mouth in the morning.     rosuvastatin  (CRESTOR ) 10 MG tablet Take 1 tablet by mouth once daily 90 tablet 1   Current Facility-Administered Medications  Medication Dose Route Frequency Provider Last Rate Last Admin   cephALEXin  (KEFLEX ) capsule 500 mg  500 mg Oral Once Dahlstedt, Stephen, Luis [name]        Physical Findings: In general this is a well appearing Caucasian male in no acute distress. He's alert and oriented x4 and appropriate throughout the examination. Cardiopulmonary assessment is negative for acute distress and he exhibits normal effort.  Lab Findings: Lab Results  Component Value Date   WBC 9.7 02/10/2024   HGB 15.0 02/10/2024   HCT 44.3 02/10/2024   MCV 93.7 02/10/2024   PLT 172 02/10/2024    Radiographic Findings:  Patient underwent CT imaging in our clinic for post implant dosimetry. The CT will be fused with his prostate MRI that will be performed at 12 noon today and will be reviewed by Dr. Patrcia to confirm there is an adequate distribution of radioactive seeds throughout the prostate gland and ensure that there are no seeds in or near the rectum. We suspect the final radiation plan and dosimetry will show appropriate  coverage of the prostate gland. He understands that we will call and inform him of any unexpected findings on further review of his imaging and dosimetry.  Impression/Plan: 78 y.o. gentleman with Stage T1c adenocarcinoma of the prostate with Gleason score of 4+3, and PSA of 8.0.  The patient is recovering from the effects of radiation. His urinary symptoms should gradually improve over the next 4-6 months. We talked about this today and he will resume taking the Uroxatrol daily as prescribed as well as complete the current antibiotic unless notified otherwise by Dr. Little office pending culture results. He is encouraged by his improvement already and is otherwise pleased with his outcome. We also talked about long-term follow-up for prostate cancer following seed implant. He understands that ongoing PSA determinations and digital rectal exams will help perform surveillance to rule out disease recurrence. He has a follow up appointment scheduled for his first post-treatment PSA on 06/02/24 and an office visit with Dr. Sherrilee on 06/10/24. He understands what to expect with his PSA measures. Patient was also educated today about some of the long-term effects from radiation including a small risk for rectal bleeding and possibly erectile dysfunction. We talked about some of the general management approaches to these potential complications. However, I did encourage the patient to contact our office or return at any point if he has questions or concerns related to his previous radiation and prostate cancer.    Sabra MICAEL Rusk, PA-C

## 2024-03-09 NOTE — Progress Notes (Signed)
  Radiation Oncology         [phone] ________________________________  Name: Luis [name] MRN: [id number]  Date: 03/09/2024  DOB: 01/09/1946  COMPLEX SIMULATION NOTE  NARRATIVE:  The patient was brought to the CT Simulation planning suite today following prostate seed implantation approximately one month ago.  Identity was confirmed.  All relevant records and images related to the planned course of therapy were reviewed.  Then, the patient was set-up supine.  CT images were obtained.  The CT images were loaded into the planning software.  Then the prostate and rectum were contoured.  Treatment planning then occurred.  The implanted iodine  125 seeds were identified by the physics staff for projection of radiation distribution  I have requested : 3D Simulation  I have requested a DVH of the following structures: Prostate and rectum.    ________________________________  Donnice FELIX Patrcia, M.D.

## 2024-03-10 LAB — URINE CULTURE

## 2024-03-11 ENCOUNTER — Ambulatory Visit: Payer: Self-pay

## 2024-03-11 NOTE — Telephone Encounter (Signed)
 Pt lvm stating he has strep throat and was taking off of Keflex  and prescribe augment. Review pt urine culture and augment is the appropriated treatment. Tried returning call with no answer lvm for return call.

## 2024-03-11 NOTE — Telephone Encounter (Signed)
 Pt return called and was made aware  the augment will treat both his strep throat and UTI. Pt voiced understanding

## 2024-03-14 ENCOUNTER — Encounter: Payer: Self-pay | Admitting: Radiation Oncology

## 2024-03-14 DIAGNOSIS — C61 Malignant neoplasm of prostate: Secondary | ICD-10-CM | POA: Diagnosis not present

## 2024-03-14 DIAGNOSIS — Z191 Hormone sensitive malignancy status: Secondary | ICD-10-CM | POA: Diagnosis not present

## 2024-03-14 NOTE — Radiation Completion Notes (Signed)
 Patient Name: Luis [name], Luis [name] MRN: [id number] Date of Birth: 1946/02/12 Referring Physician: BELVIE CLARA, M.D. Date of Service: 2024-03-14 Radiation Oncologist: Adina Barge, M.D. [facility]                             RADIATION ONCOLOGY END OF TREATMENT NOTE     Diagnosis: C61 Malignant neoplasm of prostate Staging on 2023-09-30: Prostate cancer (HCC) T=cT1c, N=cN0, M=cM0 Intent: Curative     ==========DELIVERED PLANS==========  Prostate Seed Implant Date: 2024-02-18   Plan Name: Prostate Seed Implant Site: Prostate Technique: Radioactive Seed Implant I-125 Mode: Brachytherapy Dose Per Fraction: 145 Gy Prescribed Dose (Delivered / Prescribed): 145 Gy / 145 Gy Prescribed Fxs (Delivered / Prescribed): 1 / 1     ==========ON TREATMENT VISIT DATES========== 2024-02-18     ==========UPCOMING VISITS========== 06/10/2024 [facility] [place] OFFICE VISIT Clara Belvie CROME, MD  06/02/2024 [facility]  05/19/2024 [facility] REMOTE PACER [facility] DEVICE REMOTES  04/18/2024 [facility] REMOTE PACER [facility] DEVICE REMOTES  03/18/2024 [facility] REMOTE PACER [facility] DEVICE REMOTES

## 2024-03-14 NOTE — Progress Notes (Signed)
  Radiation Oncology         [phone]) [phone] ________________________________  Name: Luis [name] MRN: [id number]  Date: 03/14/2024  DOB: December 20, 1945  3D Planning Note   Prostate Brachytherapy Post-Implant Dosimetry  Diagnosis:  78 y.o. gentleman with Stage T1c adenocarcinoma of the prostate with Gleason score of 4+3, and PSA of 8.0.  Narrative: On a previous date, Luis [name] returned following prostate seed implantation for post implant planning. He underwent CT scan complex simulation to delineate the three-dimensional structures of the pelvis and demonstrate the radiation distribution.  Since that time, the seed localization, and complex isodose planning with dose volume histograms have now been completed.  Results:   Prostate Coverage - The dose of radiation delivered to the 90% or more of the prostate gland (D90) was 106.9% of the prescription dose. This exceeds our goal of greater than 90%. Rectal Sparing - The volume of rectal tissue receiving the prescription dose or higher was 0.0 cc. This falls under our thresholds tolerance of 1.0 cc.  Impression: The prostate seed implant appears to show adequate target coverage and appropriate rectal sparing.  Plan:  The patient will continue to follow with urology for ongoing PSA determinations. I would anticipate a high likelihood for local tumor control with minimal risk for rectal morbidity.  ________________________________  Luis [name], M.D.

## 2024-03-15 NOTE — Telephone Encounter (Signed)
 Pt return call and was made aware of cysto. Pt voiced understanding

## 2024-03-15 NOTE — Telephone Encounter (Signed)
 Tried calling patient with no answer, LVM for return call We can do a cysto

## 2024-03-16 ENCOUNTER — Encounter

## 2024-03-16 DIAGNOSIS — E785 Hyperlipidemia, unspecified: Secondary | ICD-10-CM | POA: Diagnosis not present

## 2024-03-18 ENCOUNTER — Ambulatory Visit: Attending: Cardiovascular Disease

## 2024-03-18 DIAGNOSIS — I480 Paroxysmal atrial fibrillation: Secondary | ICD-10-CM | POA: Diagnosis not present

## 2024-03-19 LAB — CUP PACEART REMOTE DEVICE CHECK
Date Time Interrogation Session: 20251211230349
Implantable Pulse Generator Implant Date: 20200918

## 2024-03-25 ENCOUNTER — Telehealth: Payer: Self-pay

## 2024-03-25 NOTE — Progress Notes (Signed)
 Remote Loop Recorder Transmission

## 2024-03-25 NOTE — Telephone Encounter (Signed)
 Return call to Patient, state's he just completed a round of abt, no symptoms but not sure if infection has cleared and want another round abt so he would be cover due to the weekend and office will be closed. Pt is made aware his request will be sent to MD on advisement. Voiced understanding

## 2024-03-28 ENCOUNTER — Telehealth: Payer: Self-pay

## 2024-03-28 NOTE — Telephone Encounter (Addendum)
 ILR @ RRT   ILR reached RRT:   03/25/2024 Patient called, discussed options to leave device in or explanted.  Patient would like to get advisement from Dr. Nancey.  This is patient's 2nd loop recorder, and he would like to know if Dr. Nancey thinks he should have another one.    Marked I in Paceart: done Enter note in Paceart:  done Canceled future remotes: done Discontinued from website:  done Entered in Speciality Comments: done  Advised if further questions arise to please call the device clinic at [phone].

## 2024-04-04 ENCOUNTER — Ambulatory Visit: Payer: Self-pay | Admitting: Cardiovascular Disease

## 2024-04-06 ENCOUNTER — Other Ambulatory Visit: Payer: Self-pay

## 2024-04-06 ENCOUNTER — Telehealth: Payer: Self-pay

## 2024-04-06 DIAGNOSIS — N390 Urinary tract infection, site not specified: Secondary | ICD-10-CM

## 2024-04-06 NOTE — Telephone Encounter (Signed)
 Returned call to Pt.  Advised Dr. Nancey does not recommend replacing device.  Recommendation given to use a wearable device for continued AF surveillance.  Pt indicates understanding.  Would prefer to not have loop removed.

## 2024-04-06 NOTE — Telephone Encounter (Signed)
 Return call to patient. Pt state's he did not completed his ABT.  Pt is advised to go to the urgent care or ED.

## 2024-04-11 ENCOUNTER — Other Ambulatory Visit

## 2024-04-11 DIAGNOSIS — N390 Urinary tract infection, site not specified: Secondary | ICD-10-CM

## 2024-04-11 LAB — MICROSCOPIC EXAMINATION: WBC, UA: >30 /HPF — AB (ref 0–5)

## 2024-04-11 LAB — URINALYSIS, ROUTINE W REFLEX MICROSCOPIC
Bilirubin, UA: NEGATIVE
Glucose, UA: NEGATIVE
Ketones, UA: NEGATIVE
Nitrite, UA: POSITIVE — AB
Specific Gravity, UA: 1.02 (ref 1.005–1.030)
Urobilinogen, Ur: 0.2 mg/dL (ref 0.2–1.0)
pH, UA: 5.5 (ref 5.0–7.5)

## 2024-04-11 MED ORDER — CEPHALEXIN 500 MG PO CAPS: 500.0000 mg | ORAL_CAPSULE | Freq: Two times a day (BID) | ORAL | 0 refills | Status: AC

## 2024-04-11 NOTE — Telephone Encounter (Signed)
 Patient presents today with complaints of  Burning .  UA and Culture done today.  Dr. Nieves  reviewed results and Cephalexin  500 mg  .  Patient aware of MD recommendations and that we will reach out with culture results.      [id number], CMA

## 2024-04-12 NOTE — Addendum Note (Signed)
 Encounter addended by: Thayne Consuelo DEL on: 04/12/2024 8:51 AM  Actions taken: Imaging Exam ended

## 2024-04-15 ENCOUNTER — Ambulatory Visit: Payer: Self-pay

## 2024-04-15 LAB — URINE CULTURE

## 2024-04-16 ENCOUNTER — Encounter

## 2024-04-18 ENCOUNTER — Ambulatory Visit

## 2024-04-18 ENCOUNTER — Telehealth: Payer: Self-pay

## 2024-04-18 DIAGNOSIS — N390 Urinary tract infection, site not specified: Secondary | ICD-10-CM

## 2024-04-18 MED ORDER — DOXYCYCLINE HYCLATE 100 MG PO CAPS: 100.0000 mg | ORAL_CAPSULE | Freq: Two times a day (BID) | ORAL | 0 refills | Status: AC

## 2024-04-18 NOTE — Telephone Encounter (Signed)
 Per verbal from MD McKenzie,  send in doxycycline  BID 7 days patient made aware and confirmed pharmacy of choice

## 2024-04-18 NOTE — Telephone Encounter (Signed)
 Patient called to ask for advisement on urine  culture results patient has finished his round of keflex  and is still having UTI symptoms also just wanted it to be known he does have seed implanted if that matters. Patient was advised that MD McKenzie would be notified of culture and we will return a call later today.

## 2024-04-18 NOTE — Addendum Note (Signed)
 Addended by: SAMMIE EXIE HERO on: 04/18/2024 09:48 AM   Modules accepted: Orders

## 2024-05-10 ENCOUNTER — Other Ambulatory Visit

## 2024-05-10 ENCOUNTER — Telehealth: Payer: Self-pay

## 2024-05-10 NOTE — Telephone Encounter (Signed)
 Patient left a voice message 05-09-2024.  Has UTI symptoms.  Please advise.  Call:  [phone]

## 2024-05-10 NOTE — Telephone Encounter (Signed)
 Open in error

## 2024-05-10 NOTE — Telephone Encounter (Signed)
 Return call to pt.  Pt state's he went to his PCP for UTI on yesterday due to office being closed. Pt's state's PCP put him Bactrim  for 2 weeks.

## 2024-05-17 ENCOUNTER — Encounter

## 2024-05-19 ENCOUNTER — Ambulatory Visit

## 2024-06-02 ENCOUNTER — Other Ambulatory Visit

## 2024-06-10 ENCOUNTER — Other Ambulatory Visit: Admitting: Urology

## 2024-06-13 ENCOUNTER — Ambulatory Visit: Admitting: Cardiovascular Disease

## 2024-06-17 ENCOUNTER — Encounter

## 2024-06-19 ENCOUNTER — Ambulatory Visit

## 2024-06-29 ENCOUNTER — Ambulatory Visit: Admitting: Cardiovascular Disease

## 2024-07-18 ENCOUNTER — Encounter

## 2024-07-20 ENCOUNTER — Ambulatory Visit
